# Patient Record
Sex: Female | Born: 1960
Health system: Southern US, Community
[De-identification: ages and names within clinical notes are randomized; demographics above are authoritative.]

## PROBLEM LIST (undated history)

## (undated) DIAGNOSIS — F4310 Post-traumatic stress disorder, unspecified: Secondary | ICD-10-CM

## (undated) DIAGNOSIS — F319 Bipolar disorder, unspecified: Secondary | ICD-10-CM

## (undated) DIAGNOSIS — E785 Hyperlipidemia, unspecified: Secondary | ICD-10-CM

## (undated) DIAGNOSIS — E119 Type 2 diabetes mellitus without complications: Secondary | ICD-10-CM

## (undated) DIAGNOSIS — I10 Essential (primary) hypertension: Secondary | ICD-10-CM

## (undated) HISTORY — DX: Essential (primary) hypertension: I10

## (undated) HISTORY — DX: Hyperlipidemia, unspecified: E78.5

## (undated) HISTORY — PX: OTHER SURGICAL HISTORY: SHX169

## (undated) HISTORY — PX: TUBAL LIGATION: SHX77

## (undated) HISTORY — DX: Type 2 diabetes mellitus without complications: E11.9

---

## 2012-07-20 ENCOUNTER — Other Ambulatory Visit (HOSPITAL_COMMUNITY)
Admission: RE | Admit: 2012-07-20 | Discharge: 2012-07-20 | Disposition: A | Discharge status: Home / Self Care | Payer: BC Managed Care – PPO | Source: Ambulatory Visit | Attending: Family Medicine | Admitting: Family Medicine

## 2012-07-20 DIAGNOSIS — Z113 Encounter for screening for infections with a predominantly sexual mode of transmission: Secondary | ICD-10-CM | POA: Insufficient documentation

## 2012-07-20 DIAGNOSIS — Z124 Encounter for screening for malignant neoplasm of cervix: Secondary | ICD-10-CM | POA: Insufficient documentation

## 2012-10-19 ENCOUNTER — Encounter: Payer: BC Managed Care – PPO | Attending: Family Medicine

## 2012-10-19 VITALS — Ht 62.5 in | Wt 188.2 lb

## 2012-10-19 DIAGNOSIS — Z713 Dietary counseling and surveillance: Secondary | ICD-10-CM | POA: Insufficient documentation

## 2012-10-19 DIAGNOSIS — E119 Type 2 diabetes mellitus without complications: Secondary | ICD-10-CM | POA: Insufficient documentation

## 2012-10-25 DIAGNOSIS — E119 Type 2 diabetes mellitus without complications: Secondary | ICD-10-CM

## 2012-10-25 NOTE — Progress Notes (Signed)
Patient was seen on 10/25/12 for the second of a series of three diabetes self-management courses at the [facility]. The following learning objectives were met by the patient during this course:   Explain basic nutrition maintenance and quality assurance  Describe causes, symptoms and treatment of hypoglycemia and hyperglycemia  Explain how to manage diabetes during illness  Describe the importance of good nutrition for health and healthy eating strategies  List strategies to follow meal plan when dining out  Describe the effects of alcohol on glucose and how to use it safely  Describe problem solving skills for day-to-day glucose challenges  Describe strategies to use when treatment plan needs to change  Identify important factors involved in successful weight loss  Describe ways to remain physically active  Describe the impact of regular activity on insulin resistance  Identify current diabetes medications, their action on blood glucose, and [pssible side effects.  Handouts given in class:  Refrigerator magnet for Sick Day Guidelines  [facility] Oral medication/insulin handout  Your patient has identified their diabetes self-care support plan as:  [facility] support group   Follow-Up Plan: Patient will attend the final class of the ADA Diabetes Self-Care Education.  

## 2012-10-26 NOTE — Progress Notes (Signed)
Patient was seen on 10/19/12 for the first of a series of three diabetes self-management courses at the [facility].   Current HbA1c: 8.1%  The following learning objectives were met by the patient during this course:   Defines the role of glucose and insulin  Identifies type of diabetes and pathophysiology  Defines the diagnostic criteria for diabetes and prediabetes  States the risk factors for Type 2 Diabetes  States the symptoms of Type 2 Diabetes  Defines Type 2 Diabetes treatment goals  Defines Type 2 Diabetes treatment options  States the rationale for glucose monitoring  Identifies A1C, glucose targets, and testing times  Identifies proper sharps disposal  Defines the purpose of a diabetes food plan  Identifies carbohydrate food groups  Defines effects of carbohydrate foods on glucose levels  Identifies carbohydrate choices/grams/food labels  States benefits of physical activity and effect on glucose  Review of suggested activity guidelines  Handouts given during class include:  Type 2 Diabetes: Basics Book  My Food Plan Book  Food and Activity Log  Your patient has identified their diabetes self-care support plan as:  [facility] support group  Continued diabetes education  Follow-Up Plan: Attend core 2 and core 3

## 2012-10-26 NOTE — Patient Instructions (Signed)
Goals:  Follow Diabetes Meal Plan as instructed  Eat 3 meals and 2 snacks, every 3-5 hrs  Limit carbohydrate intake to 45 grams carbohydrate/meal  Limit carbohydrate intake to 0-15 grams carbohydrate/snack  Add lean protein foods to meals/snacks  Monitor glucose levels as instructed by your doctor  Aim for 15-30 mins of physical activity daily  Bring food record and glucose log to your next nutrition visit

## 2012-11-02 ENCOUNTER — Encounter: Payer: Self-pay | Admitting: Family Medicine

## 2012-11-03 DIAGNOSIS — E119 Type 2 diabetes mellitus without complications: Secondary | ICD-10-CM

## 2012-11-13 NOTE — Progress Notes (Signed)
Patient was seen on 11/03/12 for the third of a series of three diabetes self-management courses at the [facility]. The following learning objectives were met by the patient during this course:    Describe how diabetes changes over time   Identify diabetes complications and ways to prevent them   Describe strategies that can promote heart health including lowering blood pressure and cholesterol   Describe strategies to lower dietary fat and sodium in the diet   Identify physical activities that benefit cardiovascular health   Describe role of stress on blood glucose and develop strategies to address psychosocial issues   Evaluate success in meeting personal goal   Describe the belief that they can live successfully with diabetes day to day   Establish 2-3 goals that they will plan to diligently work on until they return for the free 42-month follow-up visit  The following handouts were given in class:  Goal setting handout  Class evaluation form  Low-sodium seasoning tips  Stress management handout  Your patient has established the following 4 month goals for diabetes self-care:  Be active 30 minutes 4 times a week  Test glucose at least 2 times a day, 4 days a week  Your patient has identified these potential barriers to change:  None stated  Your patient has identified their diabetes self-care support plan as:  [facility] support group available  Follow-Up Plan: Patient was offered a 4 month follow-up visit for diabetes self-management education.

## 2013-03-05 ENCOUNTER — Ambulatory Visit: Payer: BC Managed Care – PPO | Admitting: Dietician

## 2013-10-09 ENCOUNTER — Other Ambulatory Visit: Payer: Self-pay

## 2013-10-09 DIAGNOSIS — Z1231 Encounter for screening mammogram for malignant neoplasm of breast: Secondary | ICD-10-CM

## 2013-10-24 ENCOUNTER — Ambulatory Visit
Admission: RE | Admit: 2013-10-24 | Discharge: 2013-10-24 | Disposition: A | Discharge status: Home / Self Care | Payer: 59 | Source: Ambulatory Visit

## 2013-10-24 DIAGNOSIS — Z1231 Encounter for screening mammogram for malignant neoplasm of breast: Secondary | ICD-10-CM

## 2014-05-20 ENCOUNTER — Encounter: Payer: Self-pay | Admitting: Skilled Nursing Facility1

## 2014-05-20 ENCOUNTER — Encounter: Payer: 59 | Attending: Internal Medicine | Admitting: Skilled Nursing Facility1

## 2014-05-20 VITALS — Ht 62.0 in | Wt 187.0 lb

## 2014-05-20 DIAGNOSIS — Z713 Dietary counseling and surveillance: Secondary | ICD-10-CM | POA: Diagnosis not present

## 2014-05-20 DIAGNOSIS — E119 Type 2 diabetes mellitus without complications: Secondary | ICD-10-CM | POA: Diagnosis not present

## 2014-05-20 NOTE — Progress Notes (Signed)
Diabetes Self-Management Education  Visit Type:    Appt. Start Time: 11:00Appt. End Time: 12:30  05/20/2014  [name], identified by name and date of birth, is a 54 y.o. female with a diagnosis of Diabetes: Type 2.  Other people present during visit:      ASSESSMENT  Height 5\' 2"  (1.575 m), weight 187 lb (84.823 kg). Body mass index is 34.19 kg/(m^2).  Patient states she was diagnosed with PCOS one year ago and is also bipolar. Patient has a lot of emotional distress with the concern of food. Patient states she gets interrupted sleep due to frequent urination in the night. Patient states she needs to eat at least 3 carbohydrate choices per meal other wise she will be starving and states due to her exercise she needs a lot more food in order to not be hungry. Initial Visit Information:  Are you currently following a meal plan?: No   Are you taking your medications as prescribed?: Yes Are you checking your feet?: No   How often do you need to have someone help you when you read instructions, pamphlets, or other written materials from your doctor or pharmacy?: 1 - Never    Psychosocial:     Patient Belief/Attitude about Diabetes: Denial Self-care barriers: None Self-management support: Family Patient Concerns: Nutrition/Meal planning, Healthy Lifestyle, Weight Control Special Needs: None Preferred Learning Style: Visual, Auditory Learning Readiness: Ready  Complications:   Last HgB A1C per patient/outside source: 9 mg/dL How often do you check your blood sugar?: 1-2 times/day Number of hypoglycemic episodes per month: 0 Number of hyperglycemic episodes per week: 0 Have you had a dilated eye exam in the past 12 months?: No Have you had a dental exam in the past 12 months?: Yes  Diet Intake:  Breakfast: 2 eggo waffels peanut butter and half banana--------1 toast or waffle peanut butter with fruit sometimes milk Snack (morning): 1 slice deli ham Lunch: lentil  soup-----wendys Snack (afternoon): none Dinner: 3 bean vegetabke chili-----meat, vegetable Snack (evening): penut butter sandwhich Beverage(s): coffee with flavored creamers  Exercise:  Exercise: Other (comment) (exercise bike 30 minutes every day)  Individualized Plan for Diabetes Self-Management Training:   Learning Objective:  Patient will have a greater understanding of diabetes self-management.  Patient education plan per assessed needs and concerns is to attend individual sessions for     Education Topics Reviewed with Patient Today:  Definition of diabetes, type 1 and 2, and the diagnosis of diabetes, Factors that contribute to the development of diabetes   Role of exercise on diabetes management, blood pressure control and cardiac health., Identified with patient nutritional and/or medication changes necessary with exercise.   Daily foot exams Taught treatment of hypoglycemia - the 15 rule.          PATIENTS GOALS/Plan (Developed by the patient):  Nutrition: Follow meal plan discussed, General guidelines for healthy choices and portions discussed, Adjust meds/carbs with exercise as discussed  Plan:   There are no Patient Instructions on file for this visit.  Expected Outcomes:  Demonstrated interest in learning. Expect positive outcomes  Education material provided: Living Well with Diabetes, Meal plan card and Snack sheet  If problems or questions, patient to contact team via:  Phone  Future DSME appointment: 3-4 months

## 2014-08-16 ENCOUNTER — Ambulatory Visit: Payer: 59 | Admitting: Skilled Nursing Facility1

## 2014-09-27 ENCOUNTER — Ambulatory Visit: Payer: 59 | Admitting: Skilled Nursing Facility1

## 2015-05-16 ENCOUNTER — Other Ambulatory Visit: Payer: Self-pay | Admitting: Family Medicine

## 2015-05-16 ENCOUNTER — Other Ambulatory Visit (HOSPITAL_COMMUNITY)
Admission: RE | Admit: 2015-05-16 | Discharge: 2015-05-16 | Disposition: A | Discharge status: Home / Self Care | Payer: BLUE CROSS/BLUE SHIELD | Source: Ambulatory Visit | Attending: Family Medicine | Admitting: Family Medicine

## 2015-05-16 DIAGNOSIS — Z01411 Encounter for gynecological examination (general) (routine) with abnormal findings: Secondary | ICD-10-CM | POA: Diagnosis present

## 2015-05-16 DIAGNOSIS — Z1151 Encounter for screening for human papillomavirus (HPV): Secondary | ICD-10-CM | POA: Insufficient documentation

## 2015-05-22 LAB — CYTOLOGY - PAP

## 2015-06-18 ENCOUNTER — Other Ambulatory Visit: Payer: Self-pay | Admitting: Obstetrics and Gynecology

## 2015-07-08 ENCOUNTER — Other Ambulatory Visit: Payer: Self-pay | Admitting: Obstetrics and Gynecology

## 2015-07-23 ENCOUNTER — Other Ambulatory Visit: Payer: Self-pay

## 2015-07-23 DIAGNOSIS — Z1231 Encounter for screening mammogram for malignant neoplasm of breast: Secondary | ICD-10-CM

## 2015-08-07 ENCOUNTER — Ambulatory Visit
Admission: RE | Admit: 2015-08-07 | Discharge: 2015-08-07 | Disposition: A | Discharge status: Home / Self Care | Payer: BLUE CROSS/BLUE SHIELD | Source: Ambulatory Visit

## 2015-08-07 DIAGNOSIS — Z1231 Encounter for screening mammogram for malignant neoplasm of breast: Secondary | ICD-10-CM

## 2015-08-14 ENCOUNTER — Ambulatory Visit: Payer: 59

## 2016-06-16 ENCOUNTER — Emergency Department (HOSPITAL_COMMUNITY): Payer: Self-pay

## 2016-06-16 ENCOUNTER — Encounter (HOSPITAL_COMMUNITY): Payer: Self-pay

## 2016-06-16 ENCOUNTER — Emergency Department (HOSPITAL_COMMUNITY)
Admission: EM | Admit: 2016-06-16 | Discharge: 2016-06-16 | Disposition: A | Discharge status: Home / Self Care | Payer: Self-pay | Attending: Emergency Medicine | Admitting: Emergency Medicine

## 2016-06-16 DIAGNOSIS — E119 Type 2 diabetes mellitus without complications: Secondary | ICD-10-CM | POA: Insufficient documentation

## 2016-06-16 DIAGNOSIS — Z79899 Other long term (current) drug therapy: Secondary | ICD-10-CM | POA: Insufficient documentation

## 2016-06-16 DIAGNOSIS — Y92002 Bathroom of unspecified non-institutional (private) residence single-family (private) house as the place of occurrence of the external cause: Secondary | ICD-10-CM | POA: Insufficient documentation

## 2016-06-16 DIAGNOSIS — Y9389 Activity, other specified: Secondary | ICD-10-CM | POA: Insufficient documentation

## 2016-06-16 DIAGNOSIS — Y999 Unspecified external cause status: Secondary | ICD-10-CM | POA: Insufficient documentation

## 2016-06-16 DIAGNOSIS — I10 Essential (primary) hypertension: Secondary | ICD-10-CM | POA: Insufficient documentation

## 2016-06-16 DIAGNOSIS — Z794 Long term (current) use of insulin: Secondary | ICD-10-CM | POA: Insufficient documentation

## 2016-06-16 DIAGNOSIS — R55 Syncope and collapse: Secondary | ICD-10-CM

## 2016-06-16 DIAGNOSIS — W228XXA Striking against or struck by other objects, initial encounter: Secondary | ICD-10-CM | POA: Insufficient documentation

## 2016-06-16 DIAGNOSIS — S0101XA Laceration without foreign body of scalp, initial encounter: Secondary | ICD-10-CM | POA: Insufficient documentation

## 2016-06-16 HISTORY — DX: Bipolar disorder, unspecified: F31.9

## 2016-06-16 HISTORY — DX: Post-traumatic stress disorder, unspecified: F43.10

## 2016-06-16 LAB — URINALYSIS, ROUTINE W REFLEX MICROSCOPIC
BILIRUBIN URINE: NEGATIVE
Glucose, UA: 50 mg/dL — AB
HGB URINE DIPSTICK: NEGATIVE
Ketones, ur: NEGATIVE mg/dL
Nitrite: NEGATIVE
Protein, ur: 100 mg/dL — AB
SPECIFIC GRAVITY, URINE: 1.018 (ref 1.005–1.030)
pH: 7 (ref 5.0–8.0)

## 2016-06-16 LAB — HEPATIC FUNCTION PANEL
ALBUMIN: 4 g/dL (ref 3.5–5.0)
ALT: 26 U/L (ref 14–54)
AST: 28 U/L (ref 15–41)
Alkaline Phosphatase: 70 U/L (ref 38–126)
BILIRUBIN INDIRECT: 0.3 mg/dL (ref 0.3–0.9)
Bilirubin, Direct: 0.1 mg/dL (ref 0.1–0.5)
Total Bilirubin: 0.4 mg/dL (ref 0.3–1.2)
Total Protein: 6.8 g/dL (ref 6.5–8.1)

## 2016-06-16 LAB — CBC
HEMATOCRIT: 40.7 % (ref 36.0–46.0)
HEMOGLOBIN: 13.2 g/dL (ref 12.0–15.0)
MCH: 26.8 pg (ref 26.0–34.0)
MCHC: 32.4 g/dL (ref 30.0–36.0)
MCV: 82.7 fL (ref 78.0–100.0)
PLATELETS: 290 10*3/uL (ref 150–400)
RBC: 4.92 MIL/uL (ref 3.87–5.11)
RDW: 14.7 % (ref 11.5–15.5)
WBC: 15.7 10*3/uL — AB (ref 4.0–10.5)

## 2016-06-16 LAB — I-STAT TROPONIN, ED: Troponin i, poc: 0 ng/mL (ref 0.00–0.08)

## 2016-06-16 LAB — LIPASE, BLOOD: Lipase: 31 U/L (ref 11–51)

## 2016-06-16 LAB — BASIC METABOLIC PANEL
Anion gap: 9 (ref 5–15)
BUN: 14 mg/dL (ref 6–20)
CHLORIDE: 102 mmol/L (ref 101–111)
CO2: 27 mmol/L (ref 22–32)
CREATININE: 0.76 mg/dL (ref 0.44–1.00)
Calcium: 9.7 mg/dL (ref 8.9–10.3)
GFR calc non Af Amer: >60 mL/min (ref >60)
Glucose, Bld: 154 mg/dL — ABNORMAL HIGH (ref 65–99)
Potassium: 3.8 mmol/L (ref 3.5–5.1)
SODIUM: 138 mmol/L (ref 135–145)

## 2016-06-16 LAB — CBG MONITORING, ED: GLUCOSE-CAPILLARY: 143 mg/dL — AB (ref 65–99)

## 2016-06-16 MED ORDER — TETANUS-DIPHTH-ACELL PERTUSSIS 5-2.5-18.5 LF-MCG/0.5 IM SUSP
0.5000 mL | Freq: Once | INTRAMUSCULAR | Status: AC
  Administered 2016-06-16: 0.5 mL via INTRAMUSCULAR
  Filled 2016-06-16: qty 0.5

## 2016-06-16 MED ORDER — LIDOCAINE-EPINEPHRINE (PF) 2 %-1:200000 IJ SOLN
10.0000 mL | Freq: Once | INTRAMUSCULAR | Status: AC
Start: 2016-06-16 — End: 2016-06-16
  Administered 2016-06-16: 10 mL
  Filled 2016-06-16: qty 20

## 2016-06-16 MED ORDER — OXYCODONE-ACETAMINOPHEN 5-325 MG PO TABS: 1.0000 | ORAL_TABLET | Freq: Four times a day (QID) | ORAL | 0 refills | Status: DC | PRN

## 2016-06-16 MED ORDER — SODIUM CHLORIDE 0.9 % IV BOLUS (SEPSIS)
1000.0000 mL | Freq: Once | INTRAVENOUS | Status: AC
  Administered 2016-06-16: 1000 mL via INTRAVENOUS

## 2016-06-16 NOTE — ED Notes (Signed)
Patient transported to CT via stretcher.

## 2016-06-16 NOTE — ED Notes (Signed)
Pt given water to drink. Denies dizziness or lightheadedness during ambulation, appeared steady on her feet.

## 2016-06-16 NOTE — ED Notes (Signed)
Suture cart at bedside 

## 2016-06-16 NOTE — ED Notes (Signed)
Patient returned from CT and placed back on monitor.  

## 2016-06-16 NOTE — Discharge Instructions (Signed)
You were seen today after passing out and hitting your head.  Your workup is reassuring. You likely had a vagal episode that made to pass out. Make sure that he stay hydrated. Your laceration was repaired with staples. These need to be removed in 10 days.

## 2016-06-16 NOTE — ED Provider Notes (Signed)
[facility] DEPT Provider Note   CSN: [id number] Arrival date & time: 06/16/16  9563  By signing my name below, I, Arianna Nassar, attest that this documentation has been prepared under the direction and in the presence of Merryl Hacker, MD.  Electronically Signed: Julien Nordmann, ED Scribe. 06/16/16. 1:25 AM.    History   Chief Complaint Chief Complaint  Patient presents with  . Loss of Consciousness  . Head Laceration   The history is provided by the patient and a relative. No language interpreter was used.   HPI Comments: Jessica Guzman is a 56 y.o. female who has a PMhx of DM, HLD, HTN, and bipolar 1 disorder presents to the Emergency Department complaining of gradual worsening, right mid back pain and head pain s/p a syncopal episode that led into a fall PTA. Pt also has a laceration the back of her head. Pt says that her stomach was bothering her so she went to the bathroom. Pt reports that she does not remember losing consciousness. Pt says she awoke and realized she had hit her head when she saw the blood on her pillow. Pt says she remembers losing consciousness a second time. She notes she has no prior hx of syncopal episodes in the past. She denies ETOH or drug abuse and any new medications. Pt further denies fever, cough, nausea, vomiting, chest pain, and bladder or bowel incontinence.  Past Medical History:  Diagnosis Date  . Bipolar 1 disorder ([place])   . Diabetes mellitus without complication ([place])   . Hyperlipidemia   . Hypertension   . PTSD (post-traumatic stress disorder)     There are no active problems to display for this patient.   Past Surgical History:  Procedure Laterality Date  . PCOS    . TUBAL LIGATION      OB History    No data available       Home Medications    Prior to Admission medications   Medication Sig Start Date End Date Taking? Authorizing Provider  cholecalciferol (VITAMIN D) 1000 UNITS tablet Take 2,000 Units by mouth daily.     Yes Historical Provider, MD  insulin glargine (LANTUS) 100 UNIT/ML injection Inject 16 Units into the skin at bedtime.    Yes Historical Provider, MD  liraglutide (VICTOZA) 18 MG/3ML SOPN Inject 7.2 mg into the skin daily.   Yes Historical Provider, MD  metFORMIN (GLUCOPHAGE) 1000 MG tablet Take 1,000 mg by mouth 2 (two) times daily with a meal.   Yes Historical Provider, MD  OXcarbazepine (TRILEPTAL) 150 MG tablet Take 150 mg by mouth 2 (two) times daily.   Yes Historical Provider, MD  PARoxetine (PAXIL) 20 MG tablet Take 20 mg by mouth every evening.    Yes Historical Provider, MD  QUEtiapine (SEROQUEL) 100 MG tablet Take 100 mg by mouth at bedtime.   Yes Historical Provider, MD  oxyCODONE-acetaminophen (PERCOCET/ROXICET) 5-325 MG tablet Take 1 tablet by mouth every 6 (six) hours as needed for severe pain. 06/16/16   Merryl Hacker, MD    Family History Family History  Problem Relation Age of Onset  . Hypertension Other     Social History Social History  Substance Use Topics  . Smoking status: Never Smoker  . Smokeless tobacco: Not on file  . Alcohol use Yes     Comment: rarely     Allergies   Codeine   Review of Systems Review of Systems  Respiratory: Negative for cough and shortness of breath.  Cardiovascular: Negative for chest pain.  Gastrointestinal: Negative for nausea and vomiting.  Skin: Positive for wound.  Neurological: Positive for dizziness and syncope.  All other systems reviewed and are negative.    Physical Exam Updated Vital Signs BP 103/66   Pulse 83   Temp 98.2 F (36.8 C) (Oral)   Resp (!) 21   SpO2 94%   Physical Exam  Constitutional: She is oriented to person, place, and time.  ABCs intact  HENT:  Head: Normocephalic.  4 cm vertical laceration the back of the head  Eyes: EOM are normal. Pupils are equal, round, and reactive to light.  Neck:  No midline c spine ttp, step off or deformity  Cardiovascular: Normal rate and regular  rhythm.   No murmur heard. Pulmonary/Chest: Effort normal and breath sounds normal. She has no wheezes.  Abdominal: Soft. Bowel sounds are normal. There is no tenderness. There is no guarding.  Musculoskeletal: She exhibits no edema.  Neurological: She is alert and oriented to person, place, and time.  Skin: No erythema.  Psychiatric: She has a normal mood and affect.  Nursing note and vitals reviewed.    ED Treatments / Results  DIAGNOSTIC STUDIES: Oxygen Saturation is 97% on RA, normal by my interpretation.  COORDINATION OF CARE: 1:21 AM Discussed treatment plan with pt at bedside and pt agreed to plan.  Labs (all labs ordered are listed, but only abnormal results are displayed) Labs Reviewed  BASIC METABOLIC PANEL - Abnormal; Notable for the following:       Result Value   Glucose, Bld 154 (*)    All other components within normal limits  CBC - Abnormal; Notable for the following:    WBC 15.7 (*)    All other components within normal limits  URINALYSIS, ROUTINE W REFLEX MICROSCOPIC - Abnormal; Notable for the following:    Color, Urine AMBER (*)    APPearance CLOUDY (*)    Glucose, UA 50 (*)    Protein, ur 100 (*)    Leukocytes, UA SMALL (*)    Bacteria, UA RARE (*)    Squamous Epithelial / LPF 6-30 (*)    All other components within normal limits  CBG MONITORING, ED - Abnormal; Notable for the following:    Glucose-Capillary 143 (*)    All other components within normal limits  HEPATIC FUNCTION PANEL  LIPASE, BLOOD  I-STAT TROPOININ, ED    EKG  EKG Interpretation  Date/Time:  Wednesday June 16 2016 00:42:50 EDT Ventricular Rate:  71 PR Interval:  154 QRS Duration: 88 QT Interval:  432 QTC Calculation: 469 R Axis:   68 Text Interpretation:  Normal sinus rhythm Cannot rule out Anterior infarct , age undetermined Abnormal ECG Confirmed by Dina Rich  MD, Loma Sousa ([id number]) on 06/16/2016 1:11:08 AM Also confirmed by Dina Rich  MD, COURTNEY ([id number]), editor Lorenda Cahill CT,  Leda Gauze [phone])  on 06/16/2016 7:54:41 AM       Radiology Dg Lumbar Spine Complete  Result Date: 06/16/2016 CLINICAL DATA:  Acute onset of syncope and fall. Lower back pain. Initial encounter. EXAM: LUMBAR SPINE - COMPLETE 4+ VIEW COMPARISON:  None. FINDINGS: There is no evidence of fracture or subluxation. Vertebral bodies demonstrate normal height and alignment. Intervertebral disc spaces are preserved. The visualized neural foramina are grossly unremarkable in appearance. The visualized bowel gas pattern is unremarkable in appearance; air and stool are noted within the colon. The sacroiliac joints are within normal limits. Bilateral tubal ligation clips are noted. IMPRESSION: No evidence of  fracture or subluxation along the lumbar spine. Electronically Signed   By: Garald Balding M.D.   On: 06/16/2016 02:57   Dg Sacrum/coccyx  Result Date: 06/16/2016 CLINICAL DATA:  Acute onset of syncope and fall. Sacral pain. Initial encounter. EXAM: SACRUM AND COCCYX - 2+ VIEW COMPARISON:  None. FINDINGS: There is no evidence of fracture or dislocation. The sacrum and coccyx are grossly unremarkable in appearance. The lower lumbar spine is grossly unremarkable. The sacroiliac joints are within normal limits. Bilateral tubal ligation clips are noted. IMPRESSION: No evidence of fracture or dislocation. Electronically Signed   By: Garald Balding M.D.   On: 06/16/2016 02:58   Ct Head Wo Contrast  Result Date: 06/16/2016 CLINICAL DATA:  Status post syncope and fall. Hit posterior aspect of head against tile floor. Laceration at the posterior head. Initial encounter. EXAM: CT HEAD WITHOUT CONTRAST TECHNIQUE: Contiguous axial images were obtained from the base of the skull through the vertex without intravenous contrast. COMPARISON:  None. FINDINGS: Brain: No evidence of acute infarction, hemorrhage, hydrocephalus, extra-axial collection or mass lesion/mass effect. Prominence of the sulci suggests mild cortical volume  loss. Mild cerebellar atrophy is noted. The brainstem and fourth ventricle are within normal limits. The basal ganglia are unremarkable in appearance. The cerebral hemispheres demonstrate grossly normal gray-white differentiation. No mass effect or midline shift is seen. Vascular: No hyperdense vessel or unexpected calcification. Skull: There is no evidence of fracture; visualized osseous structures are unremarkable in appearance. Sinuses/Orbits: The orbits are within normal limits. The paranasal sinuses and mastoid air cells are well-aerated. Other: A soft tissue laceration is noted overlying the left posterior parietal calvarium. IMPRESSION: 1. No evidence of traumatic intracranial injury or fracture. 2. Soft tissue laceration overlying the left posterior parietal calvarium. Electronically Signed   By: Garald Balding M.D.   On: 06/16/2016 01:22   Ct Cervical Spine Wo Contrast  Result Date: 06/16/2016 CLINICAL DATA:  Acute onset of syncope and fall, hitting head. Laceration at the back of the head. Dizziness and headache. Hypotension. Initial encounter. EXAM: CT CERVICAL SPINE WITHOUT CONTRAST TECHNIQUE: Multidetector CT imaging of the cervical spine was performed without intravenous contrast. Multiplanar CT image reconstructions were also generated. COMPARISON:  None. FINDINGS: Alignment: Normal. Skull base and vertebrae: No acute fracture. No primary bone lesion or focal pathologic process. Soft tissues and spinal canal: No prevertebral fluid or swelling. No visible canal hematoma. Disc levels: Intervertebral disc spaces are preserved. The bony foramina are grossly unremarkable. Upper chest: Minimal atelectasis is noted at the lung apices. The thyroid gland is grossly unremarkable. Other: No additional soft tissue abnormalities are seen. The visualized portions of the brain are grossly unremarkable. IMPRESSION: No evidence of fracture or subluxation along the cervical spine. Electronically Signed   By:  Garald Balding M.D.   On: 06/16/2016 02:09    Procedures Procedures (including critical care time)  Medications Ordered in ED Medications  sodium chloride 0.9 % bolus 1,000 mL (0 mLs Intravenous Stopped 06/16/16 0430)  Tdap (BOOSTRIX) injection 0.5 mL (0.5 mLs Intramuscular Given 06/16/16 0225)  lidocaine-EPINEPHrine (XYLOCAINE W/EPI) 2 %-1:200000 (PF) injection 10 mL (10 mLs Infiltration Given 06/16/16 0225)     Initial Impression / Assessment and Plan / ED Course  I have reviewed the triage vital signs and the nursing notes.  Pertinent labs & imaging results that were available during my care of the patient were reviewed by me and considered in my medical decision making (see chart for details).    I  personally performed the services described in this documentation, which was scribed in my presence. The recorded information has been reviewed and is accurate.   Final Clinical Impressions(s) / ED Diagnoses   Final diagnoses:  Vasovagal syncope  Laceration of scalp without foreign body, initial encounter   Patient presents after a syncopal episode after going to the bathroom . VS reassuring.  Exam as above.  EKG without arrythmia.  Patient given fluids.  Laceration repaired by PA.  Imaging reassuring and patient ambulatory independently and eating without difficulty.  Follow-up for staple removal.  After history, exam, and medical workup I feel the patient has been appropriately medically screened and is safe for discharge home. Pertinent diagnoses were discussed with the patient. Patient was given return precautions.  New Prescriptions Discharge Medication List as of 06/16/2016  5:10 AM       Merryl Hacker, MD 06/19/16 [phone]

## 2016-06-16 NOTE — ED Triage Notes (Signed)
Pt states she was having a stomach and went to go to the bathroom; pt states she past out hitting head; pt present with Lac to back of head; pt states she remembers felling dizzy prior to fall; pt lives alone and called sister; pt presents hypotensive at triage; Pt states HA at 6/10 on arrival and some back pain; pt a&ox 4

## 2016-06-16 NOTE — ED Notes (Signed)
Pt ambulated self efficiently with no difficulty to and from restroom.

## 2016-06-16 NOTE — ED Provider Notes (Signed)
LACERATION REPAIR Performed by: Charlann Lange A Authorized by: Charlann Lange A Consent: Verbal consent obtained. Risks and benefits: risks, benefits and alternatives were discussed Consent given by: patient Patient identity confirmed: provided demographic data Prepped and Draped in normal sterile fashion Wound explored  Laceration Location: scalp, right  Laceration Length: 4 cm  No Foreign Bodies seen or palpated  Anesthesia: local infiltration  Local anesthetic: lidocaine 2% w/epinephrine  Anesthetic total: 3 ml  Irrigation method: syringe Amount of cleaning: standard  Skin closure: staple  Number of sutures: 8  Technique: staple  Patient tolerance: Patient tolerated the procedure well with no immediate complications.    Charlann Lange, PA-C 06/16/16 [place], MD 06/17/16 [phone]

## 2016-06-16 NOTE — ED Notes (Signed)
Patient placed on 2 L O2 [place] d/t SpO2 decreasing to 85% RA while sleeping. Upon waking patient up, SpO2 increased to 92% RA. MD made aware.

## 2017-01-20 ENCOUNTER — Emergency Department (HOSPITAL_COMMUNITY)
Admission: EM | Admit: 2017-01-20 | Discharge: 2017-01-22 | Disposition: A | Discharge status: Home / Self Care | Payer: Self-pay | Attending: Emergency Medicine | Admitting: Emergency Medicine

## 2017-01-20 ENCOUNTER — Encounter (HOSPITAL_COMMUNITY): Payer: Self-pay | Admitting: Emergency Medicine

## 2017-01-20 DIAGNOSIS — I10 Essential (primary) hypertension: Secondary | ICD-10-CM | POA: Insufficient documentation

## 2017-01-20 DIAGNOSIS — Z794 Long term (current) use of insulin: Secondary | ICD-10-CM | POA: Insufficient documentation

## 2017-01-20 DIAGNOSIS — Z79899 Other long term (current) drug therapy: Secondary | ICD-10-CM | POA: Insufficient documentation

## 2017-01-20 DIAGNOSIS — E119 Type 2 diabetes mellitus without complications: Secondary | ICD-10-CM | POA: Insufficient documentation

## 2017-01-20 DIAGNOSIS — R45851 Suicidal ideations: Secondary | ICD-10-CM | POA: Insufficient documentation

## 2017-01-20 DIAGNOSIS — F3112 Bipolar disorder, current episode manic without psychotic features, moderate: Secondary | ICD-10-CM | POA: Insufficient documentation

## 2017-01-20 DIAGNOSIS — E785 Hyperlipidemia, unspecified: Secondary | ICD-10-CM | POA: Insufficient documentation

## 2017-01-20 LAB — SALICYLATE LEVEL

## 2017-01-20 LAB — I-STAT BETA HCG BLOOD, ED (MC, WL, AP ONLY)

## 2017-01-20 LAB — CBG MONITORING, ED: GLUCOSE-CAPILLARY: 186 mg/dL — AB (ref 65–99)

## 2017-01-20 LAB — CBC
HCT: 43.4 % (ref 36.0–46.0)
Hemoglobin: 14.4 g/dL (ref 12.0–15.0)
MCH: 26.9 pg (ref 26.0–34.0)
MCHC: 33.2 g/dL (ref 30.0–36.0)
MCV: 81 fL (ref 78.0–100.0)
PLATELETS: 338 10*3/uL (ref 150–400)
RBC: 5.36 MIL/uL — ABNORMAL HIGH (ref 3.87–5.11)
RDW: 14.5 % (ref 11.5–15.5)
WBC: 12.6 10*3/uL — ABNORMAL HIGH (ref 4.0–10.5)

## 2017-01-20 LAB — RAPID URINE DRUG SCREEN, HOSP PERFORMED
Amphetamines: NOT DETECTED
Barbiturates: NOT DETECTED
Benzodiazepines: NOT DETECTED
Cocaine: NOT DETECTED
OPIATES: NOT DETECTED
Tetrahydrocannabinol: NOT DETECTED

## 2017-01-20 LAB — COMPREHENSIVE METABOLIC PANEL
ALBUMIN: 4.6 g/dL (ref 3.5–5.0)
ALK PHOS: 81 U/L (ref 38–126)
ALT: 23 U/L (ref 14–54)
ANION GAP: 8 (ref 5–15)
AST: 32 U/L (ref 15–41)
BILIRUBIN TOTAL: 0.8 mg/dL (ref 0.3–1.2)
BUN: 13 mg/dL (ref 6–20)
CALCIUM: 9.8 mg/dL (ref 8.9–10.3)
CO2: 27 mmol/L (ref 22–32)
CREATININE: 0.62 mg/dL (ref 0.44–1.00)
Chloride: 101 mmol/L (ref 101–111)
GFR calc non Af Amer: >60 mL/min (ref >60)
GLUCOSE: 160 mg/dL — AB (ref 65–99)
Potassium: 4.4 mmol/L (ref 3.5–5.1)
SODIUM: 136 mmol/L (ref 135–145)
TOTAL PROTEIN: 8 g/dL (ref 6.5–8.1)

## 2017-01-20 LAB — ETHANOL

## 2017-01-20 LAB — ACETAMINOPHEN LEVEL: Acetaminophen (Tylenol), Serum: <10 ug/mL — ABNORMAL LOW (ref 10–30)

## 2017-01-20 MED ORDER — VITAMIN D 1000 UNITS PO TABS
2000.0000 [IU] | ORAL_TABLET | Freq: Every day | ORAL | Status: DC
  Administered 2017-01-20 – 2017-01-22 (×3): 2000 [IU] via ORAL
  Filled 2017-01-20 (×3): qty 2

## 2017-01-20 MED ORDER — OXCARBAZEPINE 150 MG PO TABS
150.0000 mg | ORAL_TABLET | Freq: Two times a day (BID) | ORAL | Status: DC
  Administered 2017-01-20 – 2017-01-21 (×2): 150 mg via ORAL
  Filled 2017-01-20 (×2): qty 1

## 2017-01-20 MED ORDER — INSULIN GLARGINE 100 UNIT/ML ~~LOC~~ SOLN
16.0000 [IU] | Freq: Every day | SUBCUTANEOUS | Status: DC
  Administered 2017-01-20 – 2017-01-21 (×2): 16 [IU] via SUBCUTANEOUS
  Filled 2017-01-20 (×2): qty 0.16

## 2017-01-20 MED ORDER — METFORMIN HCL 500 MG PO TABS
1000.0000 mg | ORAL_TABLET | Freq: Two times a day (BID) | ORAL | Status: DC
  Administered 2017-01-20 – 2017-01-22 (×4): 1000 mg via ORAL
  Filled 2017-01-20 (×4): qty 2

## 2017-01-20 MED ORDER — PAROXETINE HCL 20 MG PO TABS
20.0000 mg | ORAL_TABLET | Freq: Every evening | ORAL | Status: DC
  Administered 2017-01-20 – 2017-01-21 (×2): 20 mg via ORAL
  Filled 2017-01-20 (×2): qty 1

## 2017-01-20 MED ORDER — ONDANSETRON HCL 4 MG PO TABS: 4.0000 mg | ORAL_TABLET | Freq: Three times a day (TID) | ORAL | Status: DC | PRN

## 2017-01-20 MED ORDER — LIRAGLUTIDE 18 MG/3ML ~~LOC~~ SOPN: 7.2000 mg | PEN_INJECTOR | Freq: Every day | SUBCUTANEOUS | Status: DC

## 2017-01-20 MED ORDER — ALUM & MAG HYDROXIDE-SIMETH 200-200-20 MG/5ML PO SUSP: 30.0000 mL | Freq: Four times a day (QID) | ORAL | Status: DC | PRN

## 2017-01-20 MED ORDER — QUETIAPINE FUMARATE 100 MG PO TABS
100.0000 mg | ORAL_TABLET | Freq: Every day | ORAL | Status: DC
  Administered 2017-01-20: 100 mg via ORAL
  Filled 2017-01-20: qty 1

## 2017-01-20 MED ORDER — ACETAMINOPHEN 325 MG PO TABS
650.0000 mg | ORAL_TABLET | ORAL | Status: DC | PRN
  Administered 2017-01-20: 650 mg via ORAL
  Filled 2017-01-20: qty 2

## 2017-01-20 NOTE — ED Notes (Signed)
Bed: [facility]4 Expected date:  Expected time:  Means of arrival:  Comments: 
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
## 2017-01-20 NOTE — ED Notes (Signed)
Bed: [facility] Expected date:  Expected time:  Means of arrival:  Comments: 
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
## 2017-01-20 NOTE — ED Notes (Signed)
Pt A&O x 3, no distress noted, calm & cooperative.  Monitoring for safety, Q 15 min checks in effect.

## 2017-01-21 DIAGNOSIS — G4700 Insomnia, unspecified: Secondary | ICD-10-CM

## 2017-01-21 DIAGNOSIS — F419 Anxiety disorder, unspecified: Secondary | ICD-10-CM

## 2017-01-21 DIAGNOSIS — F1099 Alcohol use, unspecified with unspecified alcohol-induced disorder: Secondary | ICD-10-CM

## 2017-01-21 DIAGNOSIS — R450 Nervousness: Secondary | ICD-10-CM

## 2017-01-21 DIAGNOSIS — F3112 Bipolar disorder, current episode manic without psychotic features, moderate: Secondary | ICD-10-CM

## 2017-01-21 LAB — CBG MONITORING, ED: Glucose-Capillary: 171 mg/dL — ABNORMAL HIGH (ref 65–99)

## 2017-01-21 MED ORDER — QUETIAPINE FUMARATE 100 MG PO TABS
200.0000 mg | ORAL_TABLET | Freq: Every day | ORAL | Status: DC
  Administered 2017-01-21: 200 mg via ORAL
  Filled 2017-01-21: qty 2

## 2017-01-21 MED ORDER — OXCARBAZEPINE 300 MG PO TABS
300.0000 mg | ORAL_TABLET | Freq: Two times a day (BID) | ORAL | Status: DC
  Administered 2017-01-21 – 2017-01-22 (×2): 300 mg via ORAL
  Filled 2017-01-21 (×2): qty 1

## 2017-01-21 NOTE — ED Notes (Signed)
Pt is calm and cooperative.  She is very drowsy today.  She says she continues to have religious thoughts that are unusual for her.  She denies S/I, H/I, and AVH.  15 minute checks and video monitoring continue.

## 2017-01-21 NOTE — ED Notes (Signed)
Hourly rounding reveals patient sleeping in room. No complaints, stable, in no acute distress. Q15 minute rounds and monitoring via Security Cameras to continue. 

## 2017-01-21 NOTE — Consult Note (Signed)
[facility] Face-to-Face Psychiatry Consult   Reason for Consult:  Suicidal ideations Referring Physician:  EDP Patient Identification: Jessica Guzman MRN:  [id number] Principal Diagnosis: Bipolar disorder, curr episode manic w/o psychotic features, moderate ([place]) Diagnosis:   Patient Active Problem List   Diagnosis Date Noted  . Bipolar disorder, curr episode manic w/o psychotic features, moderate ([place]) [F31.12] 01/21/2017    Priority: High    Total Time spent with patient: 45 minutes  Subjective:   Jessica Guzman is a 56 y.o. female patient will have medication changes and observed for safety.  HPI:  57 yo female who presented to the ED with an increase in depression and suicidal ideations.  This morning, she denied suicidal/homicidal ideations, past attempts negative, and no hallucinations.  Irritable and reporting lack of sleep.  Agreeable to have her medications adjusted and be observed for stability.  Past Psychiatric History: bipolar disorder  Risk to Self: Suicidal Ideation: No-Not Currently/Within Last 6 Months(Pt reported, yesteday. ) Suicidal Intent: Yes-Currently Present Is patient at risk for suicide?: Yes Suicidal Plan?: Yes-Currently Present Specify Current Suicidal Plan: Pt ghad a plan to run her car into another car, and to cut her wrist the "right way."  Access to Means: Yes Specify Access to Suicidal Means: Pt has a car and kitchen knives.  What has been your use of drugs/alcohol within the last 12 months?: Pt denie. UDS is negative.  How many times?: 0 Other Self Harm Risks: Pt denies.  Triggers for Past Attempts: None known Intentional Self Injurious Behavior: None(Pt denies.) Risk to Others: Homicidal Ideation: No(Pt denies. ) Thoughts of Harm to Others: No Current Homicidal Intent: No Current Homicidal Plan: No Access to Homicidal Means: No Identified Victim: NA History of harm to others?: No Assessment of Violence: None Noted Violent Behavior Description:  NA Does patient have access to weapons?: Yes (Comment)(knives.) Criminal Charges Pending?: No Does patient have a court date: No Prior Inpatient Therapy: Prior Inpatient Therapy: Yes Prior Therapy Dates: 1993 and 12/2015. Prior Therapy Facilty/Provider(s): [place], [place] and [facility] Reason for Treatment: Bi-polar. Prior Outpatient Therapy: Prior Outpatient Therapy: Yes Prior Therapy Dates: Current. Prior Therapy Facilty/Provider(s): [facility]. Reason for Treatment: Medication management and counseling. Does patient have an ACCT team?: No Does patient have Intensive In-House Services?  : No Does patient have [facility] services? : No Does patient have P4CC services?: No  Past Medical History:  Past Medical History:  Diagnosis Date  . Bipolar 1 disorder ([place])   . Diabetes mellitus without complication ([place])   . Hyperlipidemia   . Hypertension   . PTSD (post-traumatic stress disorder)     Past Surgical History:  Procedure Laterality Date  . PCOS    . TUBAL LIGATION     Family History:  Family History  Problem Relation Age of Onset  . Hypertension Other    Family Psychiatric  History: none Social History:  Social History   Substance and Sexual Activity  Alcohol Use Yes   Comment: rarely     Social History   Substance and Sexual Activity  Drug Use Not on file    Social History   Socioeconomic History  . Marital status: Single    Spouse name: None  . Number of children: None  . Years of education: None  . Highest education level: None  Social Needs  . Financial resource strain: None  . Food insecurity - worry: None  . Food insecurity - inability: None  . Transportation needs - medical:  None  . Transportation needs - non-medical: None  Occupational History  . None  Tobacco Use  . Smoking status: Never Smoker  . Smokeless tobacco: Never Used  Substance and Sexual Activity  . Alcohol use: Yes    Comment: rarely  . Drug use: None  . Sexual  activity: None  Other Topics Concern  . None  Social History Narrative  . None   Additional Social History:    Allergies:   Allergies  Allergen Reactions  . Codeine Nausea And Vomiting    Labs:  Results for orders placed or performed during the hospital encounter of 01/20/17 (from the past 48 hour(s))  Rapid urine drug screen (hospital performed)     Status: None   Collection Time: 01/20/17  5:01 PM  Result Value Ref Range   Opiates NONE DETECTED NONE DETECTED   Cocaine NONE DETECTED NONE DETECTED   Benzodiazepines NONE DETECTED NONE DETECTED   Amphetamines NONE DETECTED NONE DETECTED   Tetrahydrocannabinol NONE DETECTED NONE DETECTED   Barbiturates NONE DETECTED NONE DETECTED    Comment:        DRUG SCREEN FOR MEDICAL PURPOSES ONLY.  IF CONFIRMATION IS NEEDED FOR ANY PURPOSE, NOTIFY LAB WITHIN 5 DAYS.        LOWEST DETECTABLE LIMITS FOR URINE DRUG SCREEN Drug Class       Cutoff (ng/mL) Amphetamine      1000 Barbiturate      200 Benzodiazepine   299 Tricyclics       371 Opiates          300 Cocaine          300 THC              50   Comprehensive metabolic panel     Status: Abnormal   Collection Time: 01/20/17  5:27 PM  Result Value Ref Range   Sodium 136 135 - 145 mmol/L   Potassium 4.4 3.5 - 5.1 mmol/L   Chloride 101 101 - 111 mmol/L   CO2 27 22 - 32 mmol/L   Glucose, Bld 160 (H) 65 - 99 mg/dL   BUN 13 6 - 20 mg/dL   Creatinine, Ser 0.62 0.44 - 1.00 mg/dL   Calcium 9.8 8.9 - 10.3 mg/dL   Total Protein 8.0 6.5 - 8.1 g/dL   Albumin 4.6 3.5 - 5.0 g/dL   AST 32 15 - 41 U/L   ALT 23 14 - 54 U/L   Alkaline Phosphatase 81 38 - 126 U/L   Total Bilirubin 0.8 0.3 - 1.2 mg/dL   GFR calc non Af Amer >60 >60 mL/min   GFR calc Af Amer >60 >60 mL/min    Comment: (NOTE) The eGFR has been calculated using the CKD EPI equation. This calculation has not been validated in all clinical situations. eGFR's persistently <60 mL/min signify possible Chronic Kidney Disease.     Anion gap 8 5 - 15  Ethanol     Status: None   Collection Time: 01/20/17  5:27 PM  Result Value Ref Range   Alcohol, Ethyl (B) <10 <10 mg/dL    Comment:        LOWEST DETECTABLE LIMIT FOR SERUM ALCOHOL IS 10 mg/dL FOR MEDICAL PURPOSES ONLY   Salicylate level     Status: None   Collection Time: 01/20/17  5:27 PM  Result Value Ref Range   Salicylate Lvl <6.9 2.8 - 30.0 mg/dL  Acetaminophen level     Status: Abnormal   Collection Time:  01/20/17  5:27 PM  Result Value Ref Range   Acetaminophen (Tylenol), Serum <10 (L) 10 - 30 ug/mL    Comment:        THERAPEUTIC CONCENTRATIONS VARY SIGNIFICANTLY. A RANGE OF 10-30 ug/mL MAY BE AN EFFECTIVE CONCENTRATION FOR MANY PATIENTS. HOWEVER, SOME ARE BEST TREATED AT CONCENTRATIONS OUTSIDE THIS RANGE. ACETAMINOPHEN CONCENTRATIONS >150 ug/mL AT 4 HOURS AFTER INGESTION AND >50 ug/mL AT 12 HOURS AFTER INGESTION ARE OFTEN ASSOCIATED WITH TOXIC REACTIONS.   cbc     Status: Abnormal   Collection Time: 01/20/17  5:27 PM  Result Value Ref Range   WBC 12.6 (H) 4.0 - 10.5 K/uL   RBC 5.36 (H) 3.87 - 5.11 MIL/uL   Hemoglobin 14.4 12.0 - 15.0 g/dL   HCT 43.4 36.0 - 46.0 %   MCV 81.0 78.0 - 100.0 fL   MCH 26.9 26.0 - 34.0 pg   MCHC 33.2 30.0 - 36.0 g/dL   RDW 14.5 11.5 - 15.5 %   Platelets 338 150 - 400 K/uL  I-Stat beta hCG blood, ED     Status: None   Collection Time: 01/20/17  5:37 PM  Result Value Ref Range   I-stat hCG, quantitative <5.0 <5 mIU/mL   Comment 3            Comment:   GEST. AGE      CONC.  (mIU/mL)   <=1 WEEK        5 - 50     2 WEEKS       50 - 500     3 WEEKS       100 - 10,000     4 WEEKS     1,000 - 30,000        FEMALE AND NON-PREGNANT FEMALE:     LESS THAN 5 mIU/mL   CBG monitoring, ED     Status: Abnormal   Collection Time: 01/20/17  8:52 PM  Result Value Ref Range   Glucose-Capillary 186 (H) 65 - 99 mg/dL    Current Facility-Administered Medications  Medication Dose Route Frequency Provider Last Rate Last  Dose  . acetaminophen (TYLENOL) tablet 650 mg  650 mg Oral Q4H PRN Margarita Mail, PA-C   650 mg at 01/20/17 1907  . alum & mag hydroxide-simeth (MAALOX/MYLANTA) 200-200-20 MG/5ML suspension 30 mL  30 mL Oral Q6H PRN Harris, Abigail, PA-C      . cholecalciferol (VITAMIN D) tablet 2,000 Units  2,000 Units Oral Daily Harris, Abigail, PA-C   2,000 Units at 01/21/17 1019  . insulin glargine (LANTUS) injection 16 Units  16 Units Subcutaneous QHS Margarita Mail, PA-C   16 Units at 01/20/17 2204  . metFORMIN (GLUCOPHAGE) tablet 1,000 mg  1,000 mg Oral BID WC Harris, Abigail, PA-C   1,000 mg at 01/21/17 0842  . ondansetron (ZOFRAN) tablet 4 mg  4 mg Oral Q8H PRN Margarita Mail, PA-C      . OXcarbazepine (TRILEPTAL) tablet 150 mg  150 mg Oral BID Harris, Abigail, PA-C   150 mg at 01/21/17 1019  . PARoxetine (PAXIL) tablet 20 mg  20 mg Oral QPM Harris, Abigail, PA-C   20 mg at 01/20/17 2149  . QUEtiapine (SEROQUEL) tablet 100 mg  100 mg Oral QHS Harris, Abigail, PA-C   100 mg at 01/20/17 2148   Current Outpatient Medications  Medication Sig Dispense Refill  . bismuth subsalicylate (PEPTO BISMOL) 262 MG chewable tablet Chew 524 mg daily as needed by mouth.    . cholecalciferol (  VITAMIN D) 1000 UNITS tablet Take 2,000 Units by mouth daily.     . fluticasone (FLONASE) 50 MCG/ACT nasal spray Place 2 sprays daily into both nostrils.    . insulin glargine (LANTUS) 100 UNIT/ML injection Inject 20 Units at bedtime into the skin.     . metFORMIN (GLUCOPHAGE) 1000 MG tablet Take 1,000 mg by mouth 2 (two) times daily with a meal.    . OXcarbazepine (TRILEPTAL) 150 MG tablet Take 150 mg by mouth 2 (two) times daily.    Marland Kitchen PARoxetine (PAXIL) 20 MG tablet Take 20 mg by mouth every evening.     Marland Kitchen QUEtiapine (SEROQUEL) 100 MG tablet Take 100 mg by mouth at bedtime.    Marland Kitchen oxyCODONE-acetaminophen (PERCOCET/ROXICET) 5-325 MG tablet Take 1 tablet by mouth every 6 (six) hours as needed for severe pain. (Patient not taking:  Reported on 01/20/2017) 10 tablet 0    Musculoskeletal: Strength & Muscle Tone: within normal limits Gait & Station: normal Patient leans: N/A  Psychiatric Specialty Exam: Physical Exam  Constitutional: She is oriented to person, place, and time. She appears well-developed and well-nourished.  HENT:  Head: Normocephalic.  Neck: Normal range of motion.  Respiratory: Effort normal.  Musculoskeletal: Normal range of motion.  Neurological: She is alert and oriented to person, place, and time.  Psychiatric: Her speech is normal and behavior is normal. Judgment and thought content normal. Her mood appears anxious. Cognition and memory are normal. She exhibits a depressed mood.    Review of Systems  Psychiatric/Behavioral: Positive for depression. The patient is nervous/anxious and has insomnia.   All other systems reviewed and are negative.   Blood pressure (!) 121/58, pulse 66, temperature 98.6 F (37 C), temperature source Oral, resp. rate 18, SpO2 96 %.There is no height or weight on file to calculate BMI.  General Appearance: Casual  Eye Contact:  Fair  Speech:  Normal Rate  Volume:  Normal  Mood:  Depressed  Affect:  Congruent  Thought Process:  Coherent and Descriptions of Associations: Intact  Orientation:  Full (Time, Place, and Person)  Thought Content:  Rumination  Suicidal Thoughts:  No  Homicidal Thoughts:  No  Memory:  Immediate;   Fair Recent;   Fair Remote;   Fair  Judgement:  Fair  Insight:  Fair  Psychomotor Activity:  Decreased  Concentration:  Concentration: Fair and Attention Span: Fair  Recall:  [employer] of Knowledge:  Fair  Language:  Good  Akathisia:  No  Handed:  Right  AIMS (if indicated):     Assets:  Housing Leisure Time Physical Health Resilience Social Support  ADL's:  Intact  Cognition:  WNL  Sleep:        Treatment Plan Summary: Daily contact with patient to assess and evaluate symptoms and progress in treatment, Medication  management and Plan bipolar affective disorder, mania, mild:  -Crisis stabilization -Medication management:  Increased her Trileptal from 150 mg BID to 300 mg BID and increased her Seroquel 100 mg at bedtime for sleep/mood to 200 mg, continued her medical medications and Paxil 20 mg daily for depression -Individual counseling  Disposition: Supportive therapy provided about ongoing stressors.  Waylan Boga, NP 01/21/2017 4:48 PM  Patient seen face-to-face for psychiatric evaluation, chart reviewed and case discussed with the physician extender and developed treatment plan. Reviewed the information documented and agree with the treatment plan. Corena Pilgrim, MD

## 2017-01-21 NOTE — ED Notes (Signed)
Report to include situation, background, assessment and recommendations from [facility]. Patient sleeping, respirations regular and unlabored. Q15 minute rounds and security camera observation to continue.

## 2017-01-21 NOTE — Progress Notes (Signed)
01/21/17 1353:  LRT introduced self to pt and offered activities.  Pt was pleasant and bright.  LRT and pt played checkers in the dayroom.  Pt was focused and concentrated on the activity.  Pt requested some crossword puzzles for later.  LRT printed off crosswords for pt.     Victorino Sparrow, LRT/CTRS

## 2017-01-22 LAB — CBG MONITORING, ED: Glucose-Capillary: 133 mg/dL — ABNORMAL HIGH (ref 65–99)

## 2017-01-22 MED ORDER — OXCARBAZEPINE 300 MG PO TABS: 300.0000 mg | ORAL_TABLET | Freq: Two times a day (BID) | ORAL | 0 refills | Status: DC

## 2017-01-22 MED ORDER — QUETIAPINE FUMARATE 200 MG PO TABS
200.0000 mg | ORAL_TABLET | Freq: Every day | ORAL | 0 refills | Status: DC
Start: 2017-01-22 — End: 2021-01-28

## 2017-01-22 NOTE — ED Notes (Signed)
Hourly rounding reveals patient sleeping in room. No complaints, stable, in no acute distress. Q15 minute rounds and monitoring via Security Cameras to continue. 

## 2017-01-22 NOTE — [facility] Suicide Risk Assessment (Signed)
Suicide Risk Assessment  Discharge Assessment   [facility] Discharge Suicide Risk Assessment   Principal Problem: Bipolar disorder, curr episode manic w/o psychotic features, moderate ([place]) Discharge Diagnoses:  Patient Active Problem List   Diagnosis Date Noted  . Bipolar disorder, curr episode manic w/o psychotic features, moderate ([place]) [F31.12] 01/21/2017    Priority: High    Total Time spent with patient: 30 minutes  Musculoskeletal: Strength & Muscle Tone: within normal limits Gait & Station: normal Patient leans: N/A  Psychiatric Specialty Exam: Physical Exam  Constitutional: She is oriented to person, place, and time. She appears well-developed and well-nourished.  HENT:  Head: Normocephalic.  Neck: Normal range of motion.  Respiratory: Effort normal.  Musculoskeletal: Normal range of motion.  Neurological: She is alert and oriented to person, place, and time.  Psychiatric: Her speech is normal and behavior is normal. Judgment and thought content normal. Cognition and memory are normal. She exhibits a depressed mood.    Review of Systems  Psychiatric/Behavioral: Positive for depression.  All other systems reviewed and are negative.   Blood pressure 121/61, pulse 65, temperature 98.3 F (36.8 C), temperature source Oral, resp. rate 18, SpO2 96 %.There is no height or weight on file to calculate BMI.  General Appearance: Casual  Eye Contact:  Good  Speech:  Normal Rate  Volume:  Normal  Mood:  Depressed, mild  Affect:  Congruent  Thought Process:  Coherent and Descriptions of Associations: Intact  Orientation:  Full (Time, Place, and Person)  Thought Content:  WDL  Suicidal Thoughts:  No  Homicidal Thoughts:  No  Memory: Immediate, good; recent, good; remote, good  Judgement:  Good  Insight:  Good  Psychomotor Activity:  Normal  Concentration:  Concentration and attention span, good  Recall:  Good  Fund of Knowledge:  Good  Language:  Good  Akathisia:  No   Handed:  Right  AIMS (if indicated):     Assets:  Housing Leisure Time Physical Health Resilience Social Support  ADL's:  Intact  Cognition:  WNL  Sleep:      Mental Status Per Nursing Assessment::   On Admission:   suicidal ideations  Demographic Factors:  Caucasian and Living alone  Loss Factors: NA  Historical Factors: NA  Risk Reduction Factors:   Sense of responsibility to family, Positive social support and Positive therapeutic relationship  Continued Clinical Symptoms:  Depression, mild  Cognitive Features That Contribute To Risk:  None    Suicide Risk:  Minimal: No identifiable suicidal ideation.  Patients presenting with no risk factors but with morbid ruminations; may be classified as minimal risk based on the severity of the depressive symptoms    Plan Of Care/Follow-up recommendations:  Activity:  as tolerated Diet:  heart healhty diet  LORD, JAMISON, NP 01/22/2017, 10:26 AM

## 2017-01-22 NOTE — ED Notes (Signed)
Written dc instructions and prescriptions reviewed w/ pt.  Pt encouraged to take medications as directed, contact/see her therapist as soon as possible, and return for any changes or return of suicidal thoughts.  Pt verbalized understanding.  Pt up to the phone to call for ride home.

## 2017-01-22 NOTE — Consult Note (Signed)
[facility] Face-to-Face Psychiatry Consult   Reason for Consult:  Suicidal ideations Referring Physician:  EDP Patient Identification: Jessica Guzman MRN:  [id number] Principal Diagnosis: Bipolar disorder, curr episode manic w/o psychotic features, moderate (HCC) Diagnosis:   Patient Active Problem List   Diagnosis Date Noted  . Bipolar disorder, curr episode manic w/o psychotic features, moderate (HCC) [F31.12] 01/21/2017    Priority: High    Total Time spent with patient: 30 minutes  Subjective:   Jessica Guzman is a 56 y.o. female patient is stable for discharge.  HPI:  56 yo female who presented to the ED with an increase in depression and suicidal ideations.  Her medications were adjusted and she stabilized; she slept last night.  Continues to deny suicidal/homicidal ideations, hallucinations, and substance abuse.  Her sister did get her power back on and she now has heat.  Denies any safety concerns, stable for discharge.  Past Psychiatric History: bipolar disorder  Risk to Self: NOne Risk to Others: Homicidal Ideation: No(Pt denies. ) Thoughts of Harm to Others: No Current Homicidal Intent: No Current Homicidal Plan: No Access to Homicidal Means: No Identified Victim: NA History of harm to others?: No Assessment of Violence: None Noted Violent Behavior Description: NA Does patient have access to weapons?: Yes (Comment)(knives.) Criminal Charges Pending?: No Does patient have a court date: No Prior Inpatient Therapy: Prior Inpatient Therapy: Yes Prior Therapy Dates: 1993 and 12/2015. Prior Therapy Facilty/Provider(s): [place], [place] and [place] Reason for Treatment: Bi-polar. Prior Outpatient Therapy: Prior Outpatient Therapy: Yes Prior Therapy Dates: Current. Prior Therapy Facilty/Provider([facility]): [facility]. Reason for Treatment: Medication management and counseling. Does patient have an ACCT team?: No Does patient have Intensive In-House Services?  : No Does  patient have [facility] services? : No Does patient have P4CC services?: No  Past Medical History:  Past Medical History:  Diagnosis Date  . Bipolar 1 disorder (HCC)   . Diabetes mellitus without complication (HCC)   . Hyperlipidemia   . Hypertension   . PTSD (post-traumatic stress disorder)     Past Surgical History:  Procedure Laterality Date  . PCOS    . TUBAL LIGATION     Family History:  Family History  Problem Relation Age of Onset  . Hypertension Other    Family Psychiatric  History: none Social History:  Social History   Substance and Sexual Activity  Alcohol Use Yes   Comment: rarely     Social History   Substance and Sexual Activity  Drug Use Not on file    Social History   Socioeconomic History  . Marital status: Single    Spouse name: None  . Number of children: None  . Years of education: None  . Highest education level: None  Social Needs  . Financial resource strain: None  . Food insecurity - worry: None  . Food insecurity - inability: None  . Transportation needs - medical: None  . Transportation needs - non-medical: None  Occupational History  . None  Tobacco Use  . Smoking status: Never Smoker  . Smokeless tobacco: Never Used  Substance and Sexual Activity  . Alcohol use: Yes    Comment: rarely  . Drug use: None  . Sexual activity: None  Other Topics Concern  . None  Social History Narrative  . None   Additional Social History:    Allergies:   Allergies  Allergen Reactions  . Codeine Nausea And Vomiting    Labs:  Results for orders placed   or performed during the hospital encounter of 01/20/17 (from the past 48 hour(s))  Rapid urine drug screen (hospital performed)     Status: None   Collection Time: 01/20/17  5:01 PM  Result Value Ref Range   Opiates NONE DETECTED NONE DETECTED   Cocaine NONE DETECTED NONE DETECTED   Benzodiazepines NONE DETECTED NONE DETECTED   Amphetamines NONE DETECTED NONE DETECTED    Tetrahydrocannabinol NONE DETECTED NONE DETECTED   Barbiturates NONE DETECTED NONE DETECTED    Comment:        DRUG SCREEN FOR MEDICAL PURPOSES ONLY.  IF CONFIRMATION IS NEEDED FOR ANY PURPOSE, NOTIFY LAB WITHIN 5 DAYS.        LOWEST DETECTABLE LIMITS FOR URINE DRUG SCREEN Drug Class       Cutoff (ng/mL) Amphetamine      1000 Barbiturate      200 Benzodiazepine   200 Tricyclics       300 Opiates          300 Cocaine          300 THC              50   Comprehensive metabolic panel     Status: Abnormal   Collection Time: 01/20/17  5:27 PM  Result Value Ref Range   Sodium 136 135 - 145 mmol/L   Potassium 4.4 3.5 - 5.1 mmol/L   Chloride 101 101 - 111 mmol/L   CO2 27 22 - 32 mmol/L   Glucose, Bld 160 (H) 65 - 99 mg/dL   BUN 13 6 - 20 mg/dL   Creatinine, Ser 0.62 0.44 - 1.00 mg/dL   Calcium 9.8 8.9 - 10.3 mg/dL   Total Protein 8.0 6.5 - 8.1 g/dL   Albumin 4.6 3.5 - 5.0 g/dL   AST 32 15 - 41 U/L   ALT 23 14 - 54 U/L   Alkaline Phosphatase 81 38 - 126 U/L   Total Bilirubin 0.8 0.3 - 1.2 mg/dL   GFR calc non Af Amer >60 >60 mL/min   GFR calc Af Amer >60 >60 mL/min    Comment: (NOTE) The eGFR has been calculated using the CKD EPI equation. This calculation has not been validated in all clinical situations. eGFR's persistently <60 mL/min signify possible Chronic Kidney Disease.    Anion gap 8 5 - 15  Ethanol     Status: None   Collection Time: 01/20/17  5:27 PM  Result Value Ref Range   Alcohol, Ethyl (B) <10 <10 mg/dL    Comment:        LOWEST DETECTABLE LIMIT FOR SERUM ALCOHOL IS 10 mg/dL FOR MEDICAL PURPOSES ONLY   Salicylate level     Status: None   Collection Time: 01/20/17  5:27 PM  Result Value Ref Range   Salicylate Lvl <7.0 2.8 - 30.0 mg/dL  Acetaminophen level     Status: Abnormal   Collection Time: 01/20/17  5:27 PM  Result Value Ref Range   Acetaminophen (Tylenol), Serum <10 (L) 10 - 30 ug/mL    Comment:        THERAPEUTIC CONCENTRATIONS  VARY SIGNIFICANTLY. A RANGE OF 10-30 ug/mL MAY BE AN EFFECTIVE CONCENTRATION FOR MANY PATIENTS. HOWEVER, SOME ARE BEST TREATED AT CONCENTRATIONS OUTSIDE THIS RANGE. ACETAMINOPHEN CONCENTRATIONS >150 ug/mL AT 4 HOURS AFTER INGESTION AND >50 ug/mL AT 12 HOURS AFTER INGESTION ARE OFTEN ASSOCIATED WITH TOXIC REACTIONS.   cbc     Status: Abnormal   Collection Time: 01/20/17  5:27 PM    Result Value Ref Range   WBC 12.6 (H) 4.0 - 10.5 K/uL   RBC 5.36 (H) 3.87 - 5.11 MIL/uL   Hemoglobin 14.4 12.0 - 15.0 g/dL   HCT 43.4 36.0 - 46.0 %   MCV 81.0 78.0 - 100.0 fL   MCH 26.9 26.0 - 34.0 pg   MCHC 33.2 30.0 - 36.0 g/dL   RDW 14.5 11.5 - 15.5 %   Platelets 338 150 - 400 K/uL  I-Stat beta hCG blood, ED     Status: None   Collection Time: 01/20/17  5:37 PM  Result Value Ref Range   I-stat hCG, quantitative <5.0 <5 mIU/mL   Comment 3            Comment:   GEST. AGE      CONC.  (mIU/mL)   <=1 WEEK        5 - 50     2 WEEKS       50 - 500     3 WEEKS       100 - 10,000     4 WEEKS     1,000 - 30,000        FEMALE AND NON-PREGNANT FEMALE:     LESS THAN 5 mIU/mL   CBG monitoring, ED     Status: Abnormal   Collection Time: 01/20/17  8:52 PM  Result Value Ref Range   Glucose-Capillary 186 (H) 65 - 99 mg/dL  CBG monitoring, ED     Status: Abnormal   Collection Time: 01/21/17  9:10 PM  Result Value Ref Range   Glucose-Capillary 171 (H) 65 - 99 mg/dL   Comment 1 Notify RN   CBG monitoring, ED     Status: Abnormal   Collection Time: 01/22/17  7:45 AM  Result Value Ref Range   Glucose-Capillary 133 (H) 65 - 99 mg/dL    Current Facility-Administered Medications  Medication Dose Route Frequency Provider Last Rate Last Dose  . acetaminophen (TYLENOL) tablet 650 mg  650 mg Oral Q4H PRN Margarita Mail, PA-C   650 mg at 01/20/17 1907  . alum & mag hydroxide-simeth (MAALOX/MYLANTA) 200-200-20 MG/5ML suspension 30 mL  30 mL Oral Q6H PRN Harris, Abigail, PA-C      . cholecalciferol (VITAMIN D)  tablet 2,000 Units  2,000 Units Oral Daily Harris, Abigail, PA-C   2,000 Units at 01/22/17 1014  . insulin glargine (LANTUS) injection 16 Units  16 Units Subcutaneous QHS Margarita Mail, PA-C   16 Units at 01/21/17 2126  . metFORMIN (GLUCOPHAGE) tablet 1,000 mg  1,000 mg Oral BID WC Harris, Abigail, PA-C   1,000 mg at 01/22/17 0853  . ondansetron (ZOFRAN) tablet 4 mg  4 mg Oral Q8H PRN Margarita Mail, PA-C      . Oxcarbazepine (TRILEPTAL) tablet 300 mg  300 mg Oral BID Patrecia Pour, NP   300 mg at 01/22/17 1014  . PARoxetine (PAXIL) tablet 20 mg  20 mg Oral QPM Harris, Abigail, PA-C   20 mg at 01/21/17 1728  . QUEtiapine (SEROQUEL) tablet 200 mg  200 mg Oral QHS Patrecia Pour, NP   200 mg at 01/21/17 2128   Current Outpatient Medications  Medication Sig Dispense Refill  . bismuth subsalicylate (PEPTO BISMOL) 262 MG chewable tablet Chew 524 mg daily as needed by mouth.    . cholecalciferol (VITAMIN D) 1000 UNITS tablet Take 2,000 Units by mouth daily.     . fluticasone (FLONASE) 50 MCG/ACT nasal spray Place 2 sprays daily  into both nostrils.    . insulin glargine (LANTUS) 100 UNIT/ML injection Inject 20 Units at bedtime into the skin.     . metFORMIN (GLUCOPHAGE) 1000 MG tablet Take 1,000 mg by mouth 2 (two) times daily with a meal.    . OXcarbazepine (TRILEPTAL) 150 MG tablet Take 150 mg by mouth 2 (two) times daily.    Marland Kitchen PARoxetine (PAXIL) 20 MG tablet Take 20 mg by mouth every evening.     Marland Kitchen QUEtiapine (SEROQUEL) 100 MG tablet Take 100 mg by mouth at bedtime.    Marland Kitchen oxyCODONE-acetaminophen (PERCOCET/ROXICET) 5-325 MG tablet Take 1 tablet by mouth every 6 (six) hours as needed for severe pain. (Patient not taking: Reported on 01/20/2017) 10 tablet 0    Musculoskeletal: Strength & Muscle Tone: within normal limits Gait & Station: normal Patient leans: N/A  Psychiatric Specialty Exam: Physical Exam  Constitutional: She is oriented to person, place, and time. She appears well-developed  and well-nourished.  HENT:  Head: Normocephalic.  Neck: Normal range of motion.  Respiratory: Effort normal.  Musculoskeletal: Normal range of motion.  Neurological: She is alert and oriented to person, place, and time.  Psychiatric: Her speech is normal and behavior is normal. Judgment and thought content normal. Cognition and memory are normal. She exhibits a depressed mood.    Review of Systems  Psychiatric/Behavioral: Positive for depression.  All other systems reviewed and are negative.   Blood pressure 121/61, pulse 65, temperature 98.3 F (36.8 C), temperature source Oral, resp. rate 18, SpO2 96 %.There is no height or weight on file to calculate BMI.  General Appearance: Casual  Eye Contact:  Good  Speech:  Normal Rate  Volume:  Normal  Mood:  Depressed, mild  Affect:  Congruent  Thought Process:  Coherent and Descriptions of Associations: Intact  Orientation:  Full (Time, Place, and Person)  Thought Content:  WDL  Suicidal Thoughts:  No  Homicidal Thoughts:  No  Memory: Immediate, good; recent, good; remote, good  Judgement:  Good  Insight:  Good  Psychomotor Activity:  Normal  Concentration:  Concentration and attention span, good  Recall:  Good  Fund of Knowledge:  Good  Language:  Good  Akathisia:  No  Handed:  Right  AIMS (if indicated):     Assets:  Housing Leisure Time Physical Health Resilience Social Support  ADL's:  Intact  Cognition:  WNL  Sleep:        Treatment Plan Summary: Daily contact with patient to assess and evaluate symptoms and progress in treatment, Medication management and Plan bipolar affective disorder, mania, mild:  -Crisis stabilization -Medication management:  Continued her Trileptal from 150 mg BID to 300 mg BID and increased her Seroquel 100 mg at bedtime for sleep/mood to 200 mg, continued her medical medications and Paxil 20 mg daily for depression -Individual counseling  Disposition: Discharge home, follow-up with [facility]  Waylan Boga, NP 01/22/2017 10:18 AM  Patient seen face-to-face for psychiatric evaluation, chart reviewed and case discussed with the physician extender and developed treatment plan. Reviewed the information documented and agree with the treatment plan. Corena Pilgrim, MD

## 2017-01-22 NOTE — ED Notes (Addendum)
Pt's ride will be here shortly.  Pt ambulatory w/o difficulty to dc area with mHt, belongings returned after leaving the area.

## 2017-11-30 ENCOUNTER — Other Ambulatory Visit: Payer: Self-pay | Admitting: Obstetrics and Gynecology

## 2017-11-30 DIAGNOSIS — Z1231 Encounter for screening mammogram for malignant neoplasm of breast: Secondary | ICD-10-CM

## 2017-12-19 ENCOUNTER — Other Ambulatory Visit: Payer: Self-pay

## 2017-12-28 LAB — CYTOLOGY - PAP
Diagnosis: UNDETERMINED
HPV: NOT DETECTED

## 2018-02-09 ENCOUNTER — Encounter (HOSPITAL_COMMUNITY): Payer: Self-pay | Admitting: *Deleted

## 2018-02-09 ENCOUNTER — Ambulatory Visit
Admission: RE | Admit: 2018-02-09 | Discharge: 2018-02-09 | Disposition: A | Discharge status: Home / Self Care | Payer: No Typology Code available for payment source | Source: Ambulatory Visit | Attending: Obstetrics and Gynecology | Admitting: Obstetrics and Gynecology

## 2018-02-09 ENCOUNTER — Encounter (HOSPITAL_COMMUNITY): Payer: Self-pay

## 2018-02-09 ENCOUNTER — Ambulatory Visit (HOSPITAL_COMMUNITY)
Admission: RE | Admit: 2018-02-09 | Discharge: 2018-02-09 | Disposition: A | Discharge status: Home / Self Care | Payer: Medicaid Other | Source: Ambulatory Visit | Attending: Obstetrics and Gynecology | Admitting: Obstetrics and Gynecology

## 2018-02-09 VITALS — BP 118/78 | Ht 62.0 in | Wt 182.0 lb

## 2018-02-09 DIAGNOSIS — Z1231 Encounter for screening mammogram for malignant neoplasm of breast: Secondary | ICD-10-CM

## 2018-02-09 DIAGNOSIS — Z1239 Encounter for other screening for malignant neoplasm of breast: Secondary | ICD-10-CM

## 2018-02-09 NOTE — Progress Notes (Signed)
No complaints today.   Pap Smear: Pap smear not completed today. Last Pap smear was 12/19/2017 at the free cervical cancer screening at the [facility] and ASCUS with negative HPV. Per patient has a history of an abnormal Pap smear in 2017 that a LEEP was completed for follow-up. Last Pap smear result is in Epic.  Physical exam: Breasts Breasts symmetrical. No skin abnormalities bilateral breasts. No nipple retraction bilateral breasts. No nipple discharge bilateral breasts. No lymphadenopathy. No lumps palpated bilateral breasts. No complaints of pain or tenderness on exam. Referred patient to the [facility] for a screening mammogram. Appointment scheduled for Thursday, February 09, 2018 at 1440.        Pelvic/Bimanual No Pap smear completed today since last Pap smear and HPV typing was 12/19/2017. Pap smear not indicated per [facility] guidelines.   Smoking History: Patient has never smoked.  Patient Navigation: Patient education provided. Access to services provided for patient through [facility] program.   Colorectal Cancer Screening: Per patient had a colonoscopy completed in 2009. No complaints today. FIT Test given to patient to complete and return to [facility].  Breast and Cervical Cancer Risk Assessment: Patient has no family history of breast cancer, known genetic mutations, or radiation treatment to the chest before age 52. Per patient has a history of cervical dysplasia. Patient has no history of being immunocompromised or DES exposure in-utero.  Risk Assessment    Risk Scores      02/09/2018   Last edited by: Loletta Parish, RN   5-year risk: 1.1 %   Lifetime risk: 7.4 %

## 2018-02-09 NOTE — Patient Instructions (Signed)
Explained breast self awareness with Jessica Guzman. Patient did not need a Pap smear today due to last Pap smear was 12/19/2017. Let patient know that her next Pap smear is due in one year due to her last Pap smear was ASCUS with negative HPV. Referred patient to the [facility] for a screening mammogram. Appointment scheduled for Thursday, February 09, 2018 at 1440. Patient aware of appointment and will be there. Let [facility] know [facility] will follow up with her within the next couple weeks with results of mammogram by letter or phone. [name] verbalized understanding.  Jakobi Thetford, Arvil Chaco, RN 1:12 PM

## 2018-03-08 IMAGING — CT CT HEAD W/O CM
3 series · 15 of 47 positions shown, 18 images · non-contrast
Comparison: None.

CLINICAL DATA: Status post syncope and fall. Hit posterior aspect
of head against tile floor. Laceration at the posterior head.
Initial encounter.

EXAM:
CT HEAD WITHOUT CONTRAST
TECHNIQUE: Contiguous axial images were obtained from the base of the skull
through the vertex without intravenous contrast.

[Series 3: head 5.0 h30s · axial · 0.41mm/px · z∈[+45,+170]mm · 9 of 30 slices shown, 12 images]
[im 3/30  brain]
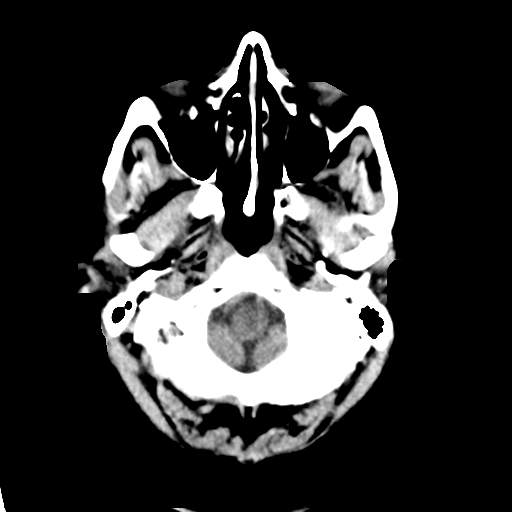
[im 3/30  bone]
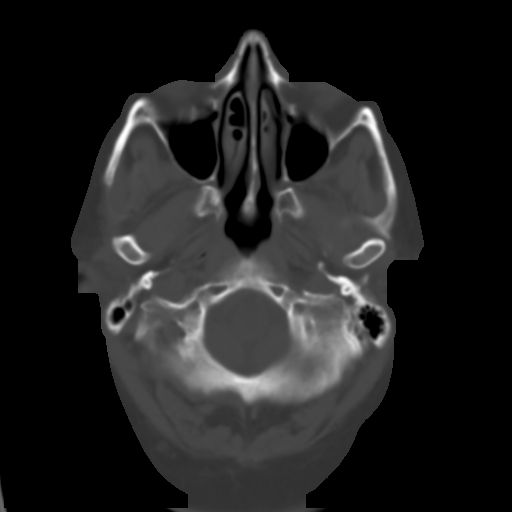
[im 6/30  brain]
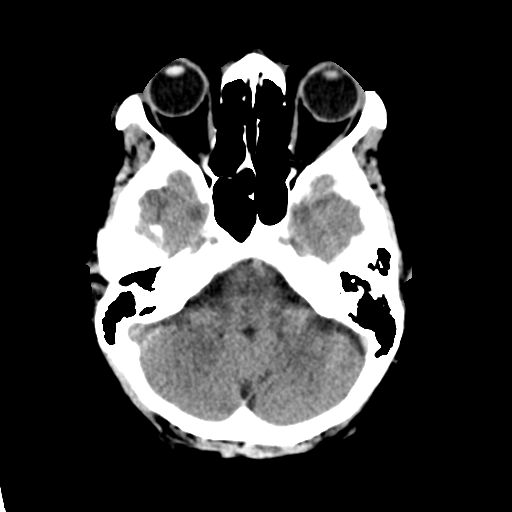
[im 9/30  brain]
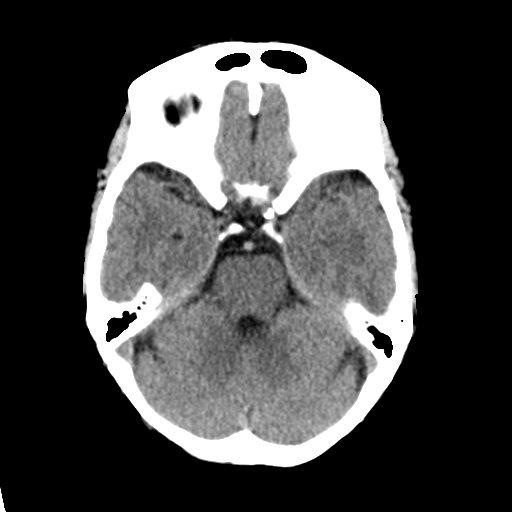
[im 12/30  brain]
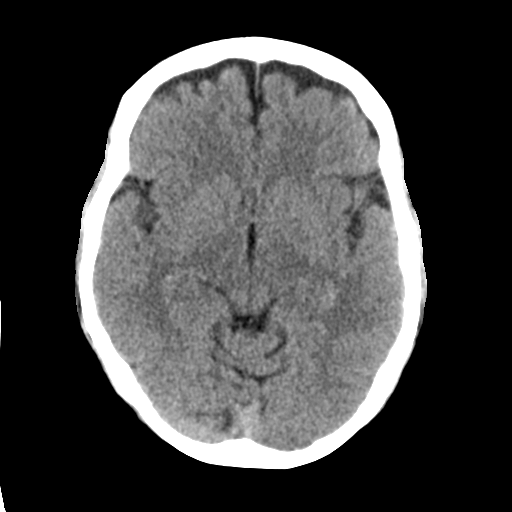
[im 16/30  brain]
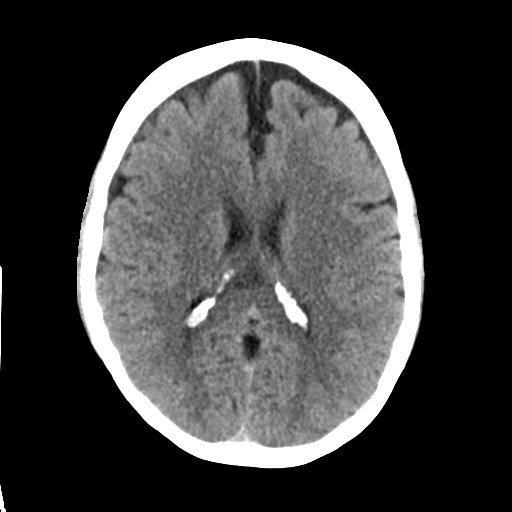
[im 16/30  bone]
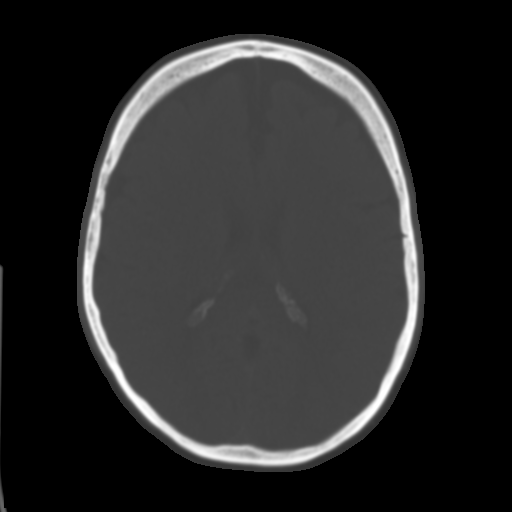
[im 19/30  brain]
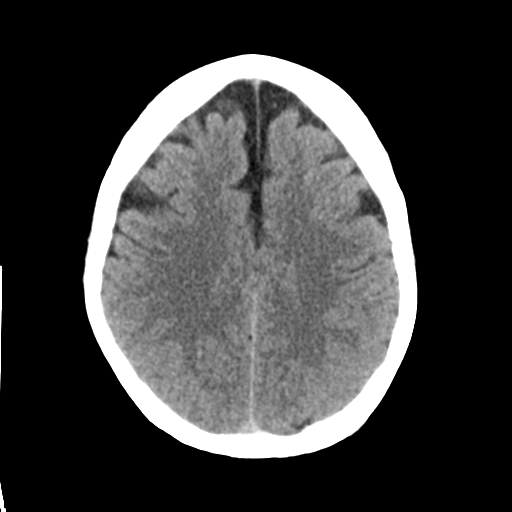
[im 22/30  brain]
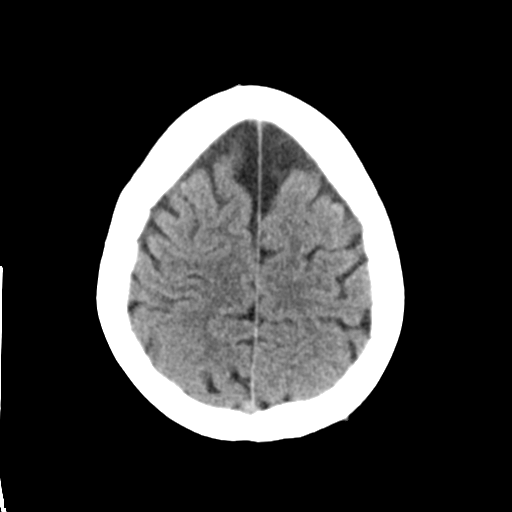
[im 25/30  brain]
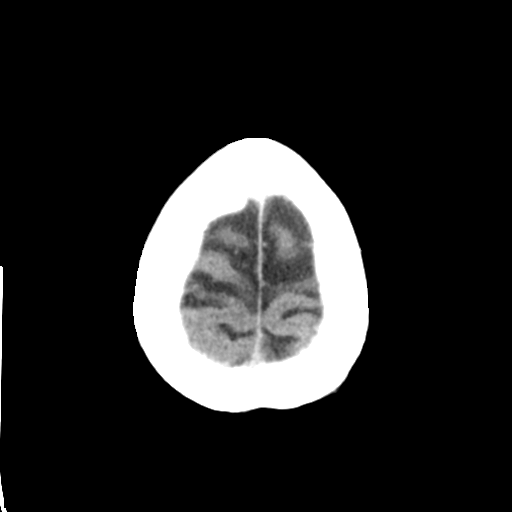
[im 28/30  brain]
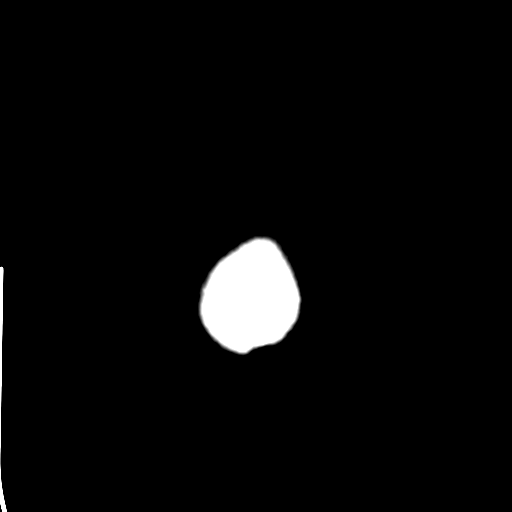
[im 28/30  bone]
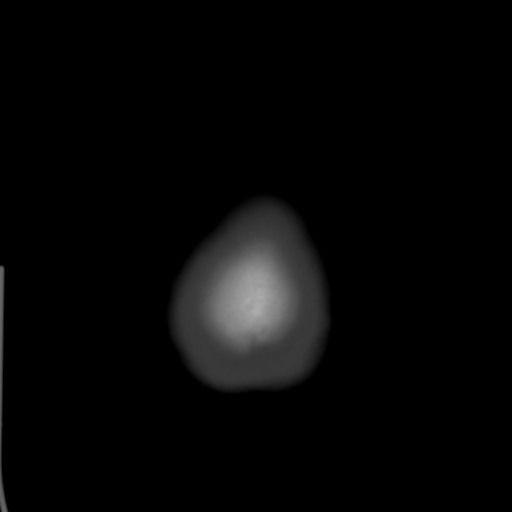

[Series 5: head 3.0 mpr cor · coronal · 0.29mm/px · 3 of 67 slices shown]
[im 23/67  brain]
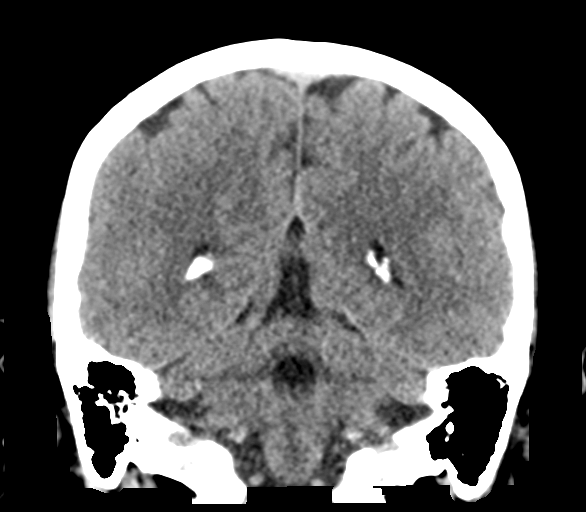
[im 30/67  brain]
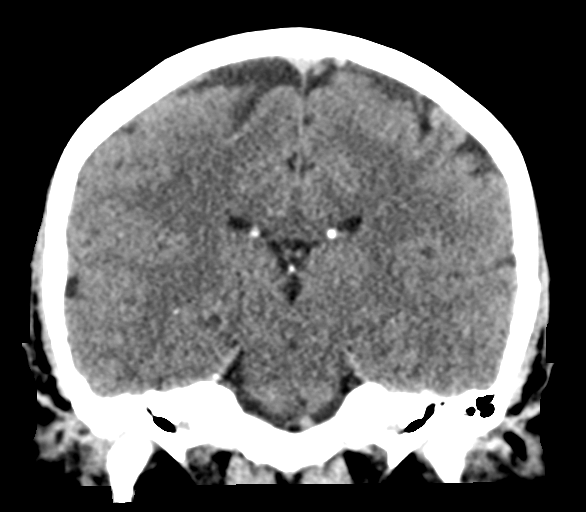
[im 37/67  brain]
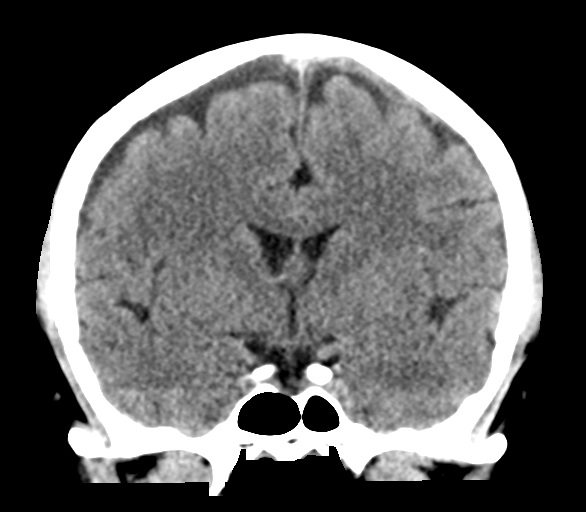

[Series 6: head 3.0 mpr sag · sagittal · 0.29mm/px · 3 of 57 slices shown]
[im 19/57  brain]
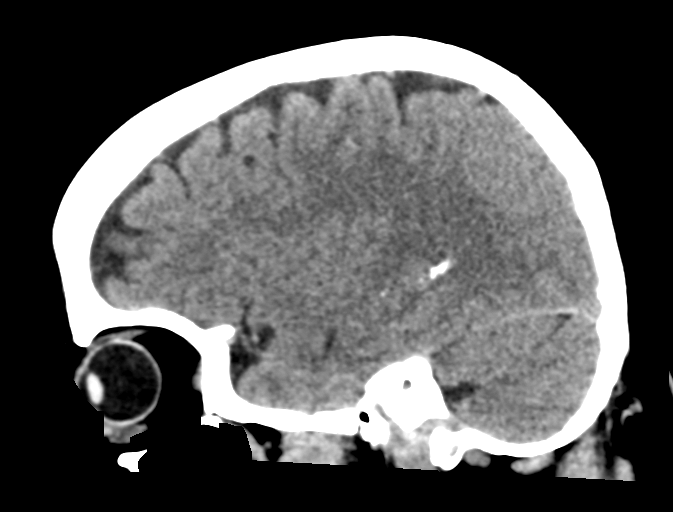
[im 29/57  brain]
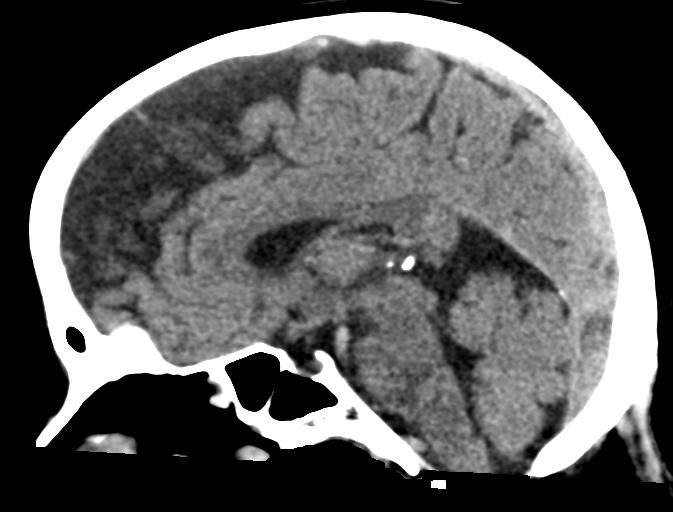
[im 38/57  brain]
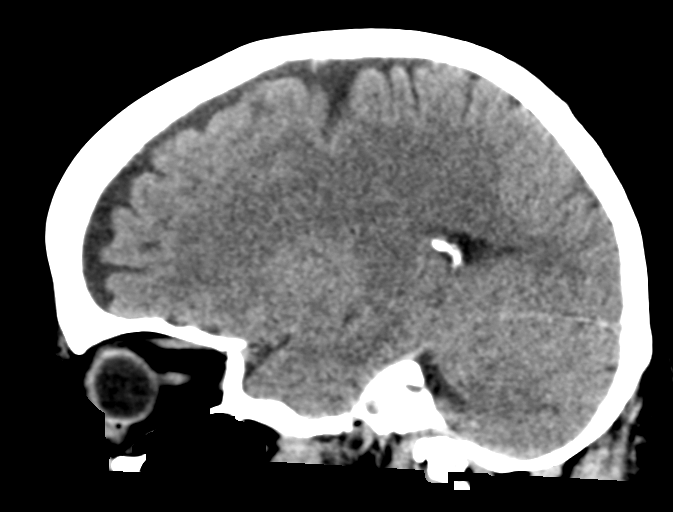

[15 of 47 positions shown; findings below may reference images not displayed]

FINDINGS: Brain: No evidence of acute infarction, hemorrhage, hydrocephalus,
extra-axial collection or mass lesion/mass effect.

Prominence of the sulci suggests mild cortical volume loss. Mild
cerebellar atrophy is noted.

The brainstem and fourth ventricle are within normal limits. The
basal ganglia are unremarkable in appearance. The cerebral
hemispheres demonstrate grossly normal gray-white differentiation.
No mass effect or midline shift is seen.

Vascular: No hyperdense vessel or unexpected calcification.

Skull: There is no evidence of fracture; visualized osseous
structures are unremarkable in appearance.

Sinuses/Orbits: The orbits are within normal limits. The paranasal
sinuses and mastoid air cells are well-aerated.

Other: A soft tissue laceration is noted overlying the left
posterior parietal calvarium.
IMPRESSION: 1. No evidence of traumatic intracranial injury or fracture.
2. Soft tissue laceration overlying the left posterior parietal
calvarium.

## 2018-03-08 IMAGING — CT CT CERVICAL SPINE W/O CM
4 series · 18 of 33 positions shown, 21 images · non-contrast
Comparison: None.

CLINICAL DATA: Acute onset of syncope and fall, hitting head.
Laceration at the back of the head. Dizziness and headache.
Hypotension. Initial encounter.

EXAM:
CT CERVICAL SPINE WITHOUT CONTRAST
TECHNIQUE: Multidetector CT imaging of the cervical spine was performed without
intravenous contrast. Multiplanar CT image reconstructions were also
generated.

[Series 202: soft tissue, idose (2) · axial · 0.30mm/px · z∈[+1036,+1148]mm · 5 of 85 slices shown]
[im 15/85  soft-tissue]
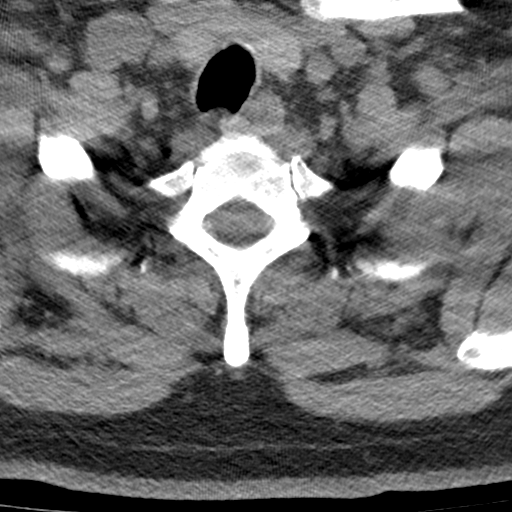
[im 29/85  soft-tissue]
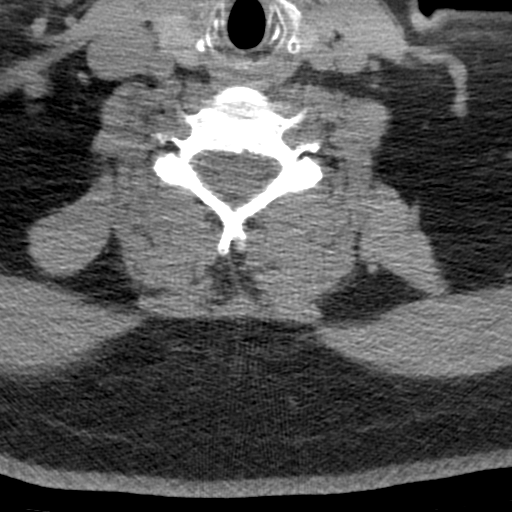
[im 43/85  soft-tissue]
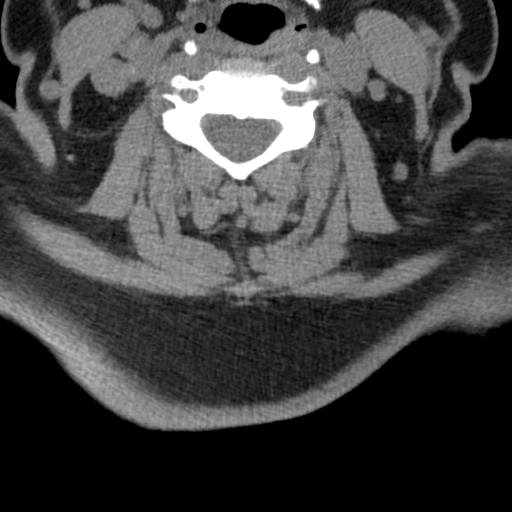
[im 57/85  soft-tissue]
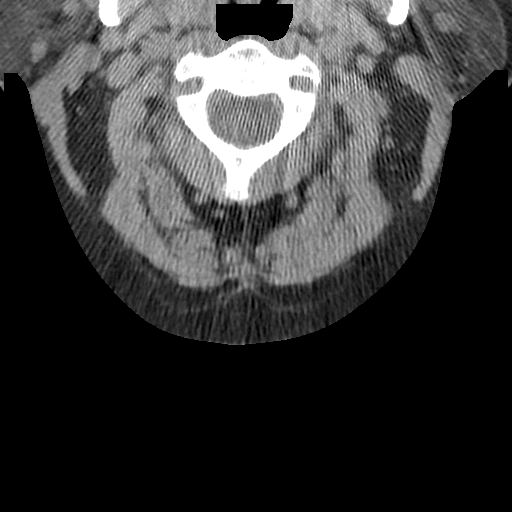
[im 71/85  soft-tissue]
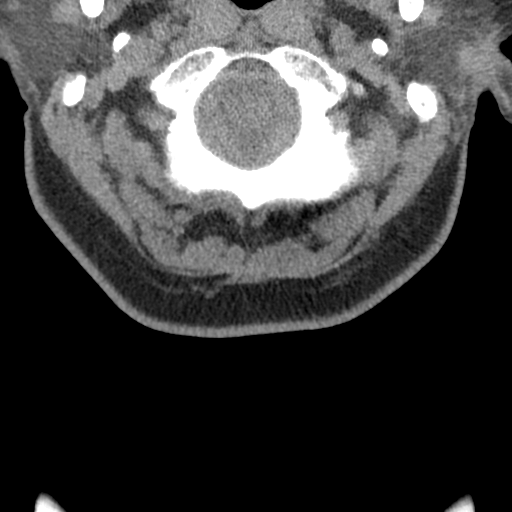

[Series 204: coronal, idose (2) · coronal · 0.33mm/px · 3 of 56 slices shown]
[im 12/56  bone]
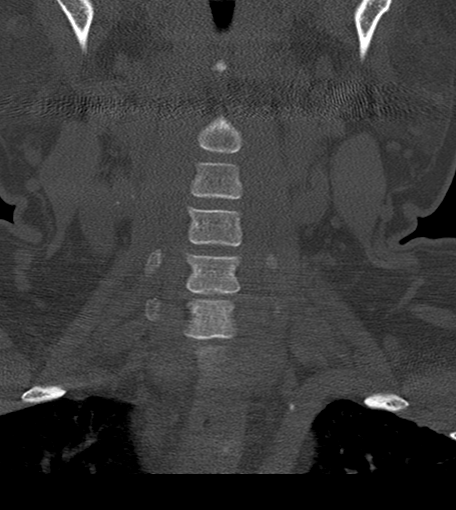
[im 23/56  bone]
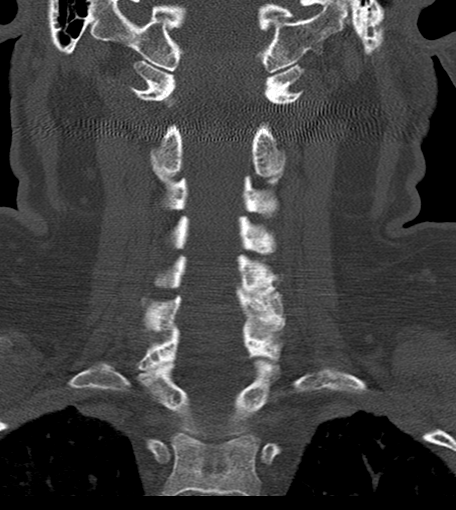
[im 34/56  bone]
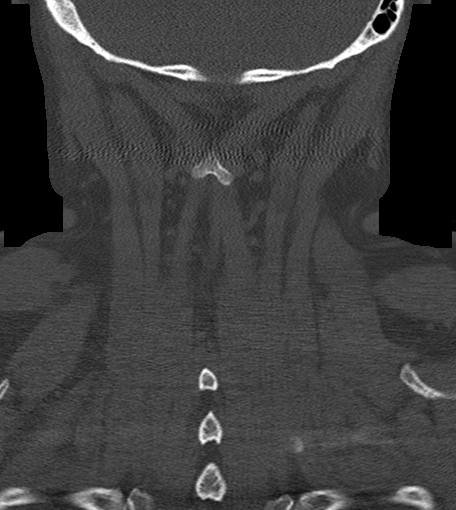

[Series 205: sagittal, idose (2) · sagittal · 0.30mm/px · 5 of 76 slices shown, 6 images]
[im 26/76  bone]
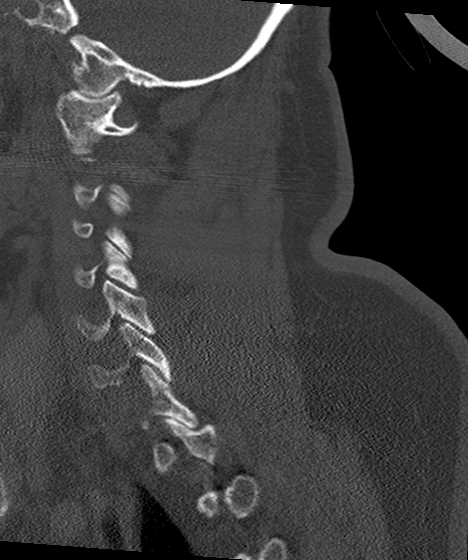
[im 32/76  bone]
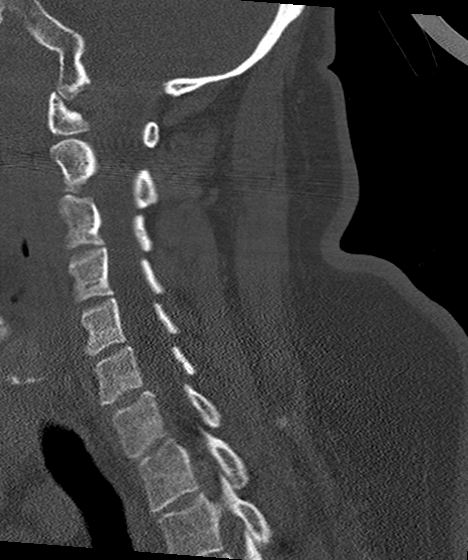
[im 38/76  soft-tissue]
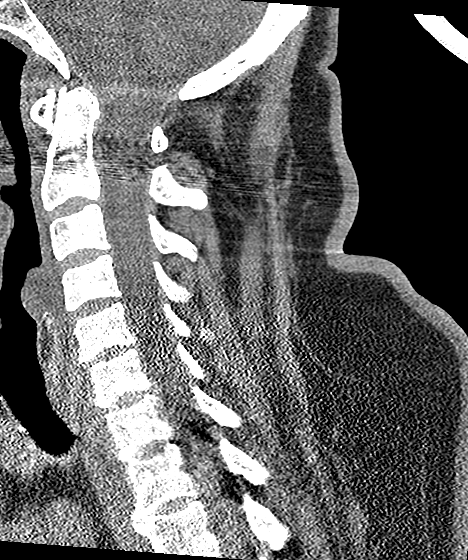
[im 38/76  bone]
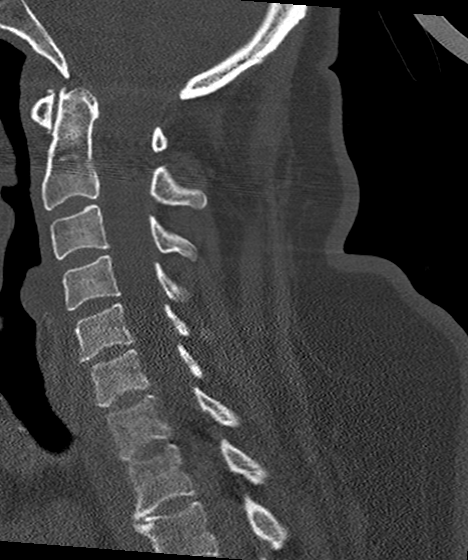
[im 44/76  bone]
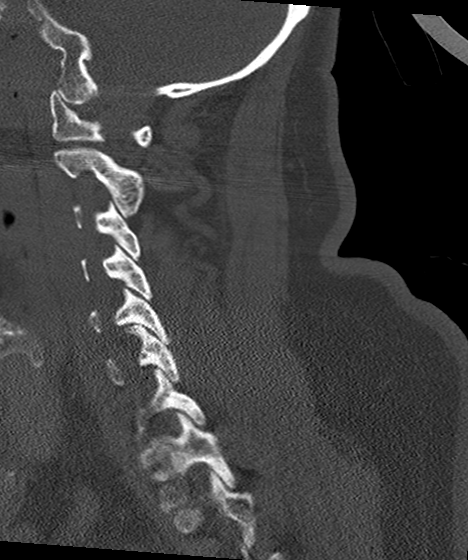
[im 51/76  bone]
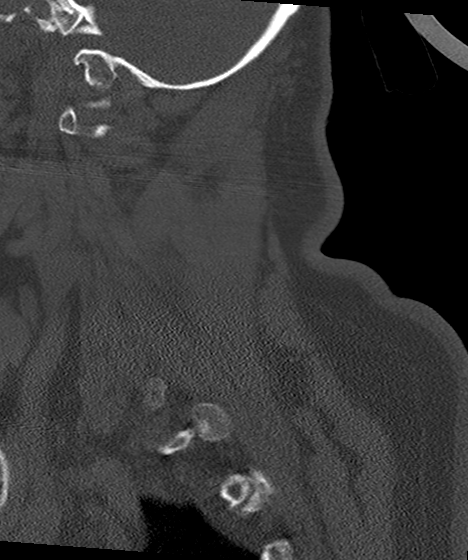

[Series 206: orthogonals, idose (2) · axial · 0.30mm/px · z∈[+1030,+1142]mm · 5 of 85 slices shown, 7 images]
[im 15/85  soft-tissue]
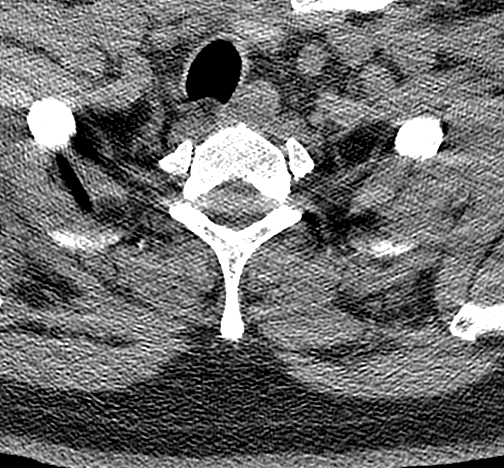
[im 15/85  bone]
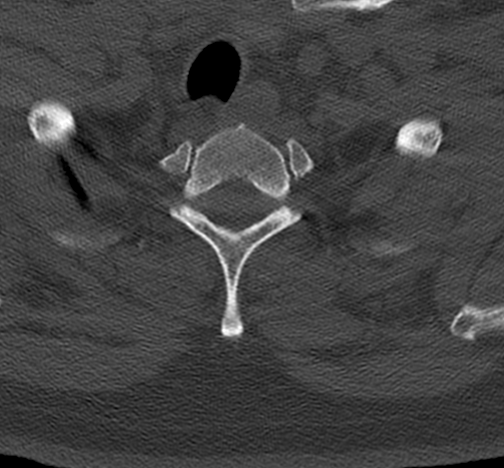
[im 29/85  bone]
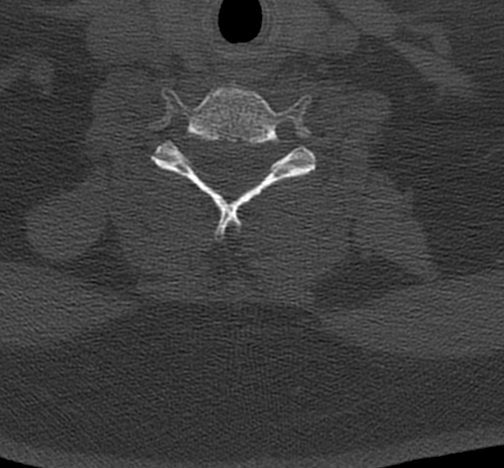
[im 43/85  bone]
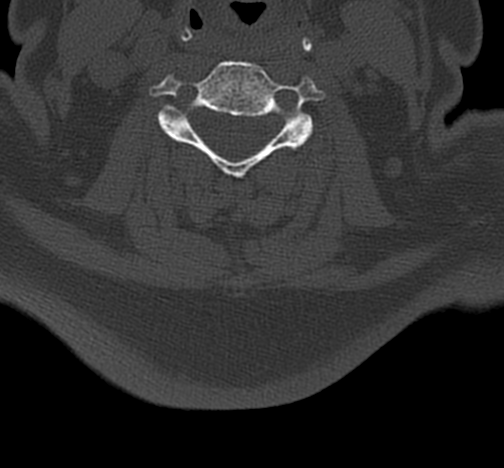
[im 57/85  bone]
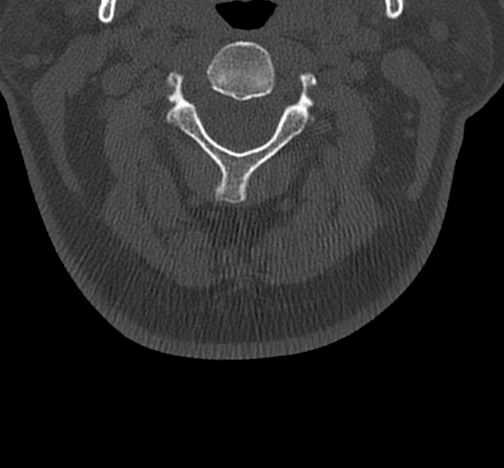
[im 71/85  soft-tissue]
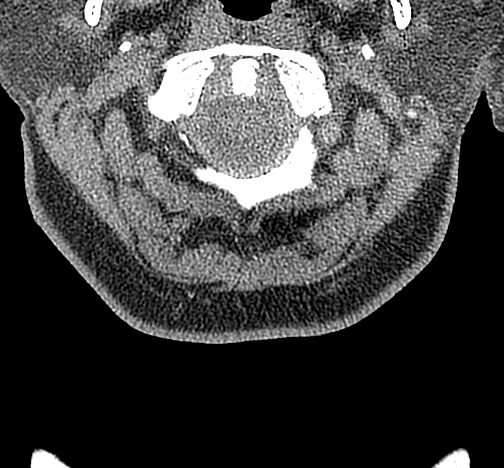
[im 71/85  bone]
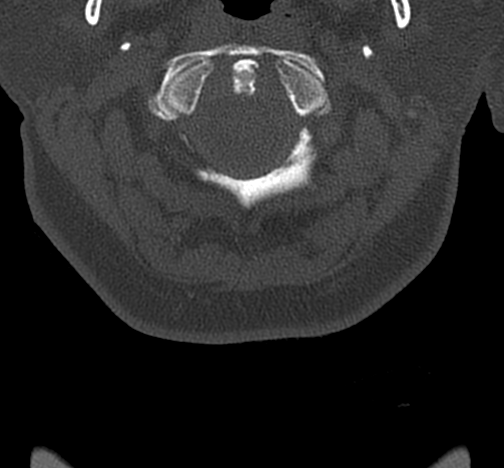

[18 of 33 positions shown; findings below may reference images not displayed]

FINDINGS: Alignment: Normal.

Skull base and vertebrae: No acute fracture. No primary bone lesion
or focal pathologic process.

Soft tissues and spinal canal: No prevertebral fluid or swelling. No
visible canal hematoma.

Disc levels: Intervertebral disc spaces are preserved. The bony
foramina are grossly unremarkable.

Upper chest: Minimal atelectasis is noted at the lung apices. The
thyroid gland is grossly unremarkable.

Other: No additional soft tissue abnormalities are seen. The
visualized portions of the brain are grossly unremarkable.
IMPRESSION: No evidence of fracture or subluxation along the cervical spine.

## 2018-05-03 ENCOUNTER — Emergency Department (HOSPITAL_COMMUNITY): Payer: Self-pay

## 2018-05-03 ENCOUNTER — Encounter (HOSPITAL_COMMUNITY): Payer: Self-pay | Admitting: Emergency Medicine

## 2018-05-03 ENCOUNTER — Emergency Department (HOSPITAL_COMMUNITY)
Admission: EM | Admit: 2018-05-03 | Discharge: 2018-05-03 | Disposition: A | Discharge status: Home / Self Care | Payer: Self-pay | Attending: Emergency Medicine | Admitting: Emergency Medicine

## 2018-05-03 ENCOUNTER — Other Ambulatory Visit: Payer: Self-pay

## 2018-05-03 DIAGNOSIS — Z7984 Long term (current) use of oral hypoglycemic drugs: Secondary | ICD-10-CM | POA: Insufficient documentation

## 2018-05-03 DIAGNOSIS — Z79899 Other long term (current) drug therapy: Secondary | ICD-10-CM | POA: Insufficient documentation

## 2018-05-03 DIAGNOSIS — R519 Headache, unspecified: Secondary | ICD-10-CM

## 2018-05-03 DIAGNOSIS — I10 Essential (primary) hypertension: Secondary | ICD-10-CM | POA: Insufficient documentation

## 2018-05-03 DIAGNOSIS — R51 Headache: Secondary | ICD-10-CM | POA: Insufficient documentation

## 2018-05-03 DIAGNOSIS — E119 Type 2 diabetes mellitus without complications: Secondary | ICD-10-CM | POA: Insufficient documentation

## 2018-05-03 LAB — CBC WITH DIFFERENTIAL/PLATELET
Abs Immature Granulocytes: 0.04 10*3/uL (ref 0.00–0.07)
Basophils Absolute: 0 10*3/uL (ref 0.0–0.1)
Basophils Relative: 0 %
Eosinophils Absolute: 0.3 10*3/uL (ref 0.0–0.5)
Eosinophils Relative: 3 %
HCT: 45.1 % (ref 36.0–46.0)
Hemoglobin: 13.9 g/dL (ref 12.0–15.0)
Immature Granulocytes: 1 %
Lymphocytes Relative: 36 %
Lymphs Abs: 3.1 10*3/uL (ref 0.7–4.0)
MCH: 26.3 pg (ref 26.0–34.0)
MCHC: 30.8 g/dL (ref 30.0–36.0)
MCV: 85.3 fL (ref 80.0–100.0)
Monocytes Absolute: 0.5 10*3/uL (ref 0.1–1.0)
Monocytes Relative: 6 %
Neutro Abs: 4.8 10*3/uL (ref 1.7–7.7)
Neutrophils Relative %: 54 %
Platelets: 271 10*3/uL (ref 150–400)
RBC: 5.29 MIL/uL — ABNORMAL HIGH (ref 3.87–5.11)
RDW: 13.8 % (ref 11.5–15.5)
WBC: 8.8 10*3/uL (ref 4.0–10.5)
nRBC: 0 % (ref 0.0–0.2)

## 2018-05-03 LAB — BASIC METABOLIC PANEL
Anion gap: 13 (ref 5–15)
BUN: 10 mg/dL (ref 6–20)
CO2: 20 mmol/L — ABNORMAL LOW (ref 22–32)
Calcium: 9.2 mg/dL (ref 8.9–10.3)
Chloride: 100 mmol/L (ref 98–111)
Creatinine, Ser: 0.57 mg/dL (ref 0.44–1.00)
GFR calc Af Amer: >60 mL/min (ref >60)
GFR calc non Af Amer: >60 mL/min (ref >60)
Glucose, Bld: 357 mg/dL — ABNORMAL HIGH (ref 70–99)
Potassium: 4.3 mmol/L (ref 3.5–5.1)
Sodium: 133 mmol/L — ABNORMAL LOW (ref 135–145)

## 2018-05-03 MED ORDER — KETOROLAC TROMETHAMINE 15 MG/ML IJ SOLN
15.0000 mg | Freq: Once | INTRAMUSCULAR | Status: AC
  Administered 2018-05-03: 15 mg via INTRAVENOUS
  Filled 2018-05-03: qty 1

## 2018-05-03 MED ORDER — DIPHENHYDRAMINE HCL 50 MG/ML IJ SOLN
12.5000 mg | Freq: Once | INTRAMUSCULAR | Status: AC
  Administered 2018-05-03: 12.5 mg via INTRAVENOUS
  Filled 2018-05-03: qty 1

## 2018-05-03 MED ORDER — METOCLOPRAMIDE HCL 5 MG/ML IJ SOLN
10.0000 mg | Freq: Once | INTRAMUSCULAR | Status: AC
  Administered 2018-05-03: 10 mg via INTRAVENOUS
  Filled 2018-05-03: qty 2

## 2018-05-03 NOTE — ED Notes (Signed)
Ambulated pt in the hallway. Pt walking "gingerly", but with a steady gait. Pt's O2 was 95% and pulse was 98. Informed Dr. Wilson Singer.

## 2018-05-03 NOTE — ED Notes (Signed)
RN informed Pt can receive visitor  

## 2018-05-03 NOTE — ED Triage Notes (Signed)
Pt arrives to ED from home with complaints of a headache that started yesterday and hasn't gone away. Pt reports she has had trouble walking the last couple of days as well. Pt reports that she was taken off of lithium three weeks ago. Pt placed in position of comfort with bed locked and lowered, call bell in reach.

## 2018-05-03 NOTE — ED Notes (Signed)
Pt ambulated with somewhat steady gait to the bathroom.

## 2018-05-03 NOTE — ED Notes (Signed)
Patient transported to MRI 

## 2018-05-03 NOTE — ED Provider Notes (Signed)
[facility] EMERGENCY DEPARTMENT Provider Note   CSN: [id number] Arrival date & time: 05/03/18  0820    History   Chief Complaint Chief Complaint  Patient presents with  . Headache    HPI Jessica Guzman is a 58 y.o. female.  HPI   84yF with numerous complaints. Primarily HA and generalized weakness. HA began yesterday.  She cannot remember she was doing it first began.  Denies any trauma.  Headache is diffuse.  It has been constant.  No appreciable exacerbating living factors.  No acute visual changes.  No jaw claudication.  No nausea or vomiting.  No neck pain or neck stiffness.  No fevers.  She feels generally weak.  Past Medical History:  Diagnosis Date  . Bipolar 1 disorder ([place])   . Diabetes mellitus without complication ([place])   . Hyperlipidemia   . Hypertension   . PTSD (post-traumatic stress disorder)     Patient Active Problem List   Diagnosis Date Noted  . Bipolar disorder, curr episode manic w/o psychotic features, moderate ([place]) 01/21/2017    Past Surgical History:  Procedure Laterality Date  . PCOS    . TUBAL LIGATION       OB History    Gravida  4   Para      Term      Preterm      AB  2   Living  2     SAB  2   TAB      Ectopic      Multiple      Live Births  2            Home Medications    Prior to Admission medications   Medication Sig Start Date End Date Taking? Authorizing Provider  atorvastatin (LIPITOR) 40 MG tablet Take 40 mg by mouth at bedtime. 04/19/18  Yes [provider]  bismuth subsalicylate (PEPTO BISMOL) 262 MG chewable tablet Chew 524 mg daily as needed by mouth.   Yes [provider]  cholecalciferol (VITAMIN D) 1000 UNITS tablet Take 2,000 Units by mouth daily.    Yes [provider]  fluticasone (FLONASE) 50 MCG/ACT nasal spray Place 2 sprays daily into both nostrils.   Yes [provider]  insulin glargine (LANTUS) 100 UNIT/ML injection Inject  18 Units into the skin at bedtime.    Yes [provider]  lisinopril (PRINIVIL,ZESTRIL) 5 MG tablet Take 5 mg by mouth daily. 04/19/18  Yes [provider]  metFORMIN (GLUCOPHAGE) 1000 MG tablet Take 1,000 mg by mouth 2 (two) times daily with a meal.   Yes [provider]  omega-3 acid ethyl esters (LOVAZA) 1 g capsule Take 1 g by mouth daily.   Yes [provider]  PARoxetine (PAXIL) 20 MG tablet Take 20 mg by mouth daily.    Yes [provider]  QUEtiapine (SEROQUEL) 200 MG tablet Take 1 tablet (200 mg total) at bedtime by mouth. 01/22/17  Yes Lord, Asa Saunas, NP  Oxcarbazepine (TRILEPTAL) 300 MG tablet Take 1 tablet (300 mg total) 2 (two) times daily by mouth. Patient not taking: Reported on 02/09/2018 01/22/17   Patrecia Pour, NP    Family History Family History  Problem Relation Age of Onset  . Hypertension Other   . Stroke Maternal Grandfather   . Cancer Paternal Grandfather   . Diabetes Paternal Grandfather     Social History Social History   Tobacco Use  . Smoking status: Never  Smoker  . Smokeless tobacco: Never Used  Substance Use Topics  . Alcohol use: Yes    Comment: rarely  . Drug use: Never     Allergies   Codeine   Review of Systems Review of Systems  All systems reviewed and negative, other than as noted in HPI.  Physical Exam Updated Vital Signs BP 115/65   Pulse 69   Temp 98.5 F (36.9 C) (Oral)   Resp 12   Ht 5\' 2"  (1.575 m)   Wt 81.6 kg   SpO2 94%   BMI 32.92 kg/m   Physical Exam Vitals signs and nursing note reviewed.  Constitutional:      General: She is not in acute distress.    Appearance: She is well-developed.  HENT:     Head: Normocephalic and atraumatic.  Eyes:     General:        Right eye: No discharge.        Left eye: No discharge.     Extraocular Movements: Extraocular movements intact.     Conjunctiva/sclera: Conjunctivae normal.     Pupils: Pupils are equal, round, and  reactive to light.  Neck:     Musculoskeletal: Neck supple.     Comments: No nuchal rigidity.  Cardiovascular:     Rate and Rhythm: Normal rate and regular rhythm.     Heart sounds: Normal heart sounds. No murmur. No friction rub. No gallop.   Pulmonary:     Effort: Pulmonary effort is normal. No respiratory distress.     Breath sounds: Normal breath sounds.  Abdominal:     General: There is no distension.     Palpations: Abdomen is soft.     Tenderness: There is no abdominal tenderness.  Musculoskeletal:        General: No tenderness.  Skin:    General: Skin is warm and dry.  Neurological:     Mental Status: She is alert and oriented to person, place, and time.     Cranial Nerves: No cranial nerve deficit.     Sensory: No sensory deficit.     Motor: No weakness.     Coordination: Coordination normal.     Comments: Exam inconsistent. Pt initially saying she couldn't sit herself up in bed and had to use arms to pull herself up. Then able to get up herself out of bed to standing. Walking w/o assistance. No focal deficits noted. Patellar reflexes present.   Psychiatric:        Behavior: Behavior normal.        Thought Content: Thought content normal.      ED Treatments / Results  Labs (all labs ordered are listed, but only abnormal results are displayed) Labs Reviewed  CBC WITH DIFFERENTIAL/PLATELET - Abnormal; Notable for the following components:      Result Value   RBC 5.29 (*)    All other components within normal limits  BASIC METABOLIC PANEL - Abnormal; Notable for the following components:   Sodium 133 (*)    CO2 20 (*)    Glucose, Bld 357 (*)    All other components within normal limits    EKG EKG Interpretation  Date/Time:  Wednesday May 03 2018 08:28:55 EST Ventricular Rate:  91 PR Interval:    QRS Duration: 90 QT Interval:  354 QTC Calculation: 436 R Axis:   58 Text Interpretation:  Sinus rhythm Nonspecific T abnormalities, lateral leads Baseline  wander in lead(s) II aVR V5 Confirmed by Virgel Manifold [phone])  on 05/03/2018 8:34:15 AM   Radiology Ct Head Wo Contrast  Result Date: 05/03/2018 CLINICAL DATA:  Worst headache of life. EXAM: CT HEAD WITHOUT CONTRAST TECHNIQUE: Contiguous axial images were obtained from the base of the skull through the vertex without intravenous contrast. COMPARISON:  06/16/2016 FINDINGS: Brain: Similar findings of age advanced atrophy with sulcal prominence and prominence of the bifrontal extra-axial spaces. Scattered periventricular hypodensities compatible with microvascular ischemic disease. Gray-white differentiation is otherwise well maintained. No CT evidence of acute large territory infarct. No intraparenchymal or extra-axial mass or hemorrhage. Unchanged size and configuration of the ventricles and the basilar cisterns. No midline shift. Vascular: Intracranial atherosclerosis. Skull: No displaced calvarial fracture. Sinuses/Orbits: Limited visualization of the paranasal sinuses and the mastoid air cells is normal. No air-fluid levels. Other: Regional soft tissues appear normal. IMPRESSION: Similar findings of atrophy and microvascular ischemic disease without acute intracranial process. Electronically Signed   By: Sandi Mariscal M.D.   On: 05/03/2018 11:03   Mr Brain Wo Contrast  Result Date: 05/03/2018 CLINICAL DATA:  Headache and difficulty walking. EXAM: MRI HEAD WITHOUT CONTRAST TECHNIQUE: Multiplanar, multiecho pulse sequences of the brain and surrounding structures were obtained without intravenous contrast. COMPARISON:  05/03/2018 head CT FINDINGS: Brain: There is no evidence of acute infarct, intracranial hemorrhage, mass, midline shift, or extra-axial fluid collection. Mildly to moderately age advanced cerebral atrophy is most notable in the frontal regions. The ventricles are normal in size. The brain is normal in signal. Vascular: Major intracranial vascular flow voids are preserved. Skull and upper  cervical spine: Unremarkable bone marrow signal. Sinuses/Orbits: Unremarkable orbits. Paranasal sinuses and mastoid air cells are clear. Other: None. IMPRESSION: Age advanced cerebral atrophy, otherwise unremarkable brain MRI. Electronically Signed   By: Logan Bores M.D.   On: 05/03/2018 13:39    Procedures Procedures (including critical care time)  Medications Ordered in ED Medications  metoCLOPramide (REGLAN) injection 10 mg (10 mg Intravenous Given 05/03/18 0952)  diphenhydrAMINE (BENADRYL) injection 12.5 mg (12.5 mg Intravenous Given 05/03/18 0951)  ketorolac (TORADOL) 15 MG/ML injection 15 mg (15 mg Intravenous Given 05/03/18 0951)     Initial Impression / Assessment and Plan / ED Course  I have reviewed the triage vital signs and the nursing notes.  Pertinent labs & imaging results that were available during my care of the patient were reviewed by me and considered in my medical decision making (see chart for details).     57yF with numerous complaints but primarily HA and generalized weakness. Lithium stopped 3w ago. I doubt withdrawal symptoms at this point. Neuro exam is nonfocal although she has a lot of various neurological complaints. Imaging including MRI w/o acute abnormality. I doubt meningitis, temporal arteritis, CO poisoning, glaucoma, etc.   Final Clinical Impressions(s) / ED Diagnoses   Final diagnoses:  Nonintractable headache, unspecified chronicity pattern, unspecified headache type    ED Discharge Orders    None       Virgel Manifold, MD 05/10/18 [phone]

## 2018-05-03 NOTE — ED Notes (Signed)
Patient verbalizes understanding of discharge instructions. Opportunity for questioning and answers were provided. Armband removed by staff, pt discharged from ED.  

## 2018-05-14 ENCOUNTER — Other Ambulatory Visit: Payer: Self-pay

## 2018-05-14 ENCOUNTER — Emergency Department (HOSPITAL_COMMUNITY)
Admission: EM | Admit: 2018-05-14 | Discharge: 2018-05-14 | Disposition: A | Discharge status: Home / Self Care | Payer: Medicaid Other | Attending: Emergency Medicine | Admitting: Emergency Medicine

## 2018-05-14 DIAGNOSIS — E119 Type 2 diabetes mellitus without complications: Secondary | ICD-10-CM | POA: Insufficient documentation

## 2018-05-14 DIAGNOSIS — M791 Myalgia, unspecified site: Secondary | ICD-10-CM | POA: Insufficient documentation

## 2018-05-14 DIAGNOSIS — R51 Headache: Secondary | ICD-10-CM | POA: Insufficient documentation

## 2018-05-14 DIAGNOSIS — Z79899 Other long term (current) drug therapy: Secondary | ICD-10-CM | POA: Insufficient documentation

## 2018-05-14 DIAGNOSIS — Z794 Long term (current) use of insulin: Secondary | ICD-10-CM | POA: Insufficient documentation

## 2018-05-14 DIAGNOSIS — I10 Essential (primary) hypertension: Secondary | ICD-10-CM | POA: Insufficient documentation

## 2018-05-14 DIAGNOSIS — R202 Paresthesia of skin: Secondary | ICD-10-CM | POA: Insufficient documentation

## 2018-05-14 DIAGNOSIS — R519 Headache, unspecified: Secondary | ICD-10-CM

## 2018-05-14 DIAGNOSIS — R739 Hyperglycemia, unspecified: Secondary | ICD-10-CM

## 2018-05-14 LAB — BASIC METABOLIC PANEL
Anion gap: 12 (ref 5–15)
BUN: 14 mg/dL (ref 6–20)
CO2: 20 mmol/L — ABNORMAL LOW (ref 22–32)
Calcium: 9.5 mg/dL (ref 8.9–10.3)
Chloride: 101 mmol/L (ref 98–111)
Creatinine, Ser: 0.66 mg/dL (ref 0.44–1.00)
GFR calc Af Amer: >60 mL/min (ref >60)
GFR calc non Af Amer: >60 mL/min (ref >60)
GLUCOSE: 235 mg/dL — AB (ref 70–99)
Potassium: 3.8 mmol/L (ref 3.5–5.1)
Sodium: 133 mmol/L — ABNORMAL LOW (ref 135–145)

## 2018-05-14 LAB — CBC
HCT: 44.5 % (ref 36.0–46.0)
Hemoglobin: 13.9 g/dL (ref 12.0–15.0)
MCH: 26.1 pg (ref 26.0–34.0)
MCHC: 31.2 g/dL (ref 30.0–36.0)
MCV: 83.5 fL (ref 80.0–100.0)
Platelets: 256 10*3/uL (ref 150–400)
RBC: 5.33 MIL/uL — ABNORMAL HIGH (ref 3.87–5.11)
RDW: 13.5 % (ref 11.5–15.5)
WBC: 10.9 10*3/uL — ABNORMAL HIGH (ref 4.0–10.5)
nRBC: 0 % (ref 0.0–0.2)

## 2018-05-14 LAB — URINALYSIS, ROUTINE W REFLEX MICROSCOPIC
Bilirubin Urine: NEGATIVE
GLUCOSE, UA: NEGATIVE mg/dL
Hgb urine dipstick: NEGATIVE
Ketones, ur: 20 mg/dL — AB
Leukocytes,Ua: NEGATIVE
Nitrite: NEGATIVE
Protein, ur: NEGATIVE mg/dL
Specific Gravity, Urine: 1.012 (ref 1.005–1.030)
pH: 6 (ref 5.0–8.0)

## 2018-05-14 LAB — I-STAT BETA HCG BLOOD, ED (MC, WL, AP ONLY): I-stat hCG, quantitative: <5 m[IU]/mL (ref <5)

## 2018-05-14 MED ORDER — DIPHENHYDRAMINE HCL 50 MG/ML IJ SOLN
25.0000 mg | Freq: Once | INTRAMUSCULAR | Status: AC
  Administered 2018-05-14: 25 mg via INTRAVENOUS
  Filled 2018-05-14: qty 1

## 2018-05-14 MED ORDER — SODIUM CHLORIDE 0.9 % IV BOLUS
1000.0000 mL | Freq: Once | INTRAVENOUS | Status: AC
  Administered 2018-05-14: 1000 mL via INTRAVENOUS

## 2018-05-14 MED ORDER — PROCHLORPERAZINE EDISYLATE 10 MG/2ML IJ SOLN
10.0000 mg | Freq: Once | INTRAMUSCULAR | Status: AC
  Administered 2018-05-14: 10 mg via INTRAVENOUS
  Filled 2018-05-14: qty 2

## 2018-05-14 MED ORDER — PROCHLORPERAZINE MALEATE 10 MG PO TABS: 10.0000 mg | ORAL_TABLET | Freq: Two times a day (BID) | ORAL | 0 refills | Status: DC | PRN

## 2018-05-14 NOTE — ED Triage Notes (Signed)
Pt states that she had been having headache for the past several weeks, seen on 2/26 for the same, followed up with PCP, pt also has multiple other complaints of feeling like there is something in her R ear, pain all over her body, heaviness all over. Neuro intact bilaterally. Pt c/o of dizziness and nausea at times, back pain, abd pain, hyperglycemia.

## 2018-05-14 NOTE — ED Notes (Signed)
Pt ambulated to room from waiting room, tolerated well. 

## 2018-05-14 NOTE — ED Provider Notes (Signed)
[facility] EMERGENCY DEPARTMENT Provider Note   CSN: [id number] Arrival date & time: 05/14/18  0110    History   Chief Complaint Chief Complaint  Patient presents with  . Headache    HPI Jessica Guzman is a 58 y.o. female.     HPI 58 year old female here with multiple complaints.  Patient has a history of bipolar disorder, diabetes, hypertension, and PTSD.  Patient's primary complaint is persistent, usually unilateral headaches involving both sides along with tingling sensations in her hands and throughout her back.  She reports she feels a flushing and warm sensation that shoots up her back, to her head, associated with headache.  She is also had increasing anxiety, nausea, right ear pain, as well as bilateral hip pain for the last 3 weeks.  She has been seen multiple times by her PCP as well as in this emergency department for this, with negative MRI.  The symptoms do seem to correlate with decreasing her lithium.  She is not currently on any other mood stabilizer.  However, she denies any mania or difficulty sleeping.  No SI, HI, or hallucinations.  She reports that she has been referred to multiple specialist and expresses frustration that she cannot get a diagnosis.  She admits that her symptoms today are primarily her headache, which is constant but she has had minimal relief with naproxen at home.  No focal numbness or weakness that persists.  No fevers or chills.  No neck pain or neck stiffness.  No other complaints.  No other recent medication change.   Past Medical History:  Diagnosis Date  . Bipolar 1 disorder ([place])   . Diabetes mellitus without complication ([place])   . Hyperlipidemia   . Hypertension   . PTSD (post-traumatic stress disorder)     Patient Active Problem List   Diagnosis Date Noted  . Bipolar disorder, curr episode manic w/o psychotic features, moderate ([place]) 01/21/2017    Past Surgical History:  Procedure Laterality Date  . PCOS     . TUBAL LIGATION       OB History    Gravida  4   Para      Term      Preterm      AB  2   Living  2     SAB  2   TAB      Ectopic      Multiple      Live Births  2            Home Medications    Prior to Admission medications   Medication Sig Start Date End Date Taking? Authorizing Provider  atorvastatin (LIPITOR) 40 MG tablet Take 40 mg by mouth at bedtime. 04/19/18   [provider]  bismuth subsalicylate (PEPTO BISMOL) 262 MG chewable tablet Chew 524 mg daily as needed by mouth.    [provider]  cholecalciferol (VITAMIN D) 1000 UNITS tablet Take 2,000 Units by mouth daily.     [provider]  fluticasone (FLONASE) 50 MCG/ACT nasal spray Place 2 sprays daily into both nostrils.    [provider]  insulin glargine (LANTUS) 100 UNIT/ML injection Inject 18 Units into the skin at bedtime.     [provider]  lisinopril (PRINIVIL,ZESTRIL) 5 MG tablet Take 5 mg by mouth daily. 04/19/18   [provider]  metFORMIN (GLUCOPHAGE) 1000 MG tablet Take 1,000 mg by mouth 2 (two) times daily with a meal.    [provider]  omega-3 acid ethyl esters (LOVAZA) 1 g capsule Take 1 g by mouth daily.    [provider]  Oxcarbazepine (TRILEPTAL) 300 MG tablet Take 1 tablet (300 mg total) 2 (two) times daily by mouth. Patient not taking: Reported on 02/09/2018 01/22/17   Patrecia Pour, NP  PARoxetine (PAXIL) 20 MG tablet Take 20 mg by mouth daily.     [provider]  prochlorperazine (COMPAZINE) 10 MG tablet Take 1 tablet (10 mg total) by mouth 2 (two) times daily as needed for nausea (or headache). 05/14/18   Duffy Bruce, MD  QUEtiapine (SEROQUEL) 200 MG tablet Take 1 tablet (200 mg total) at bedtime by mouth. 01/22/17   Patrecia Pour, NP    Family History Family History  Problem Relation Age of Onset  . Hypertension Other   . Stroke Maternal Grandfather   . Cancer Paternal  Grandfather   . Diabetes Paternal Grandfather     Social History Social History   Tobacco Use  . Smoking status: Never Smoker  . Smokeless tobacco: Never Used  Substance Use Topics  . Alcohol use: Yes    Comment: rarely  . Drug use: Never     Allergies   Codeine   Review of Systems Review of Systems  Constitutional: Positive for chills and fatigue. Negative for fever.  HENT: Positive for ear pain and sore throat. Negative for congestion and rhinorrhea.   Eyes: Negative for visual disturbance.  Respiratory: Positive for cough. Negative for shortness of breath and wheezing.   Cardiovascular: Negative for chest pain and leg swelling.  Gastrointestinal: Positive for nausea. Negative for abdominal pain, diarrhea and vomiting.  Endocrine: Positive for polyuria.  Genitourinary: Negative for dysuria, flank pain, vaginal bleeding and vaginal discharge.  Musculoskeletal: Positive for arthralgias and myalgias. Negative for neck pain.  Skin: Negative for rash.  Allergic/Immunologic: Negative for immunocompromised state.  Neurological: Positive for weakness, light-headedness and numbness. Negative for syncope and headaches.  Hematological: Does not bruise/bleed easily.  All other systems reviewed and are negative.    Physical Exam Updated Vital Signs BP 129/72 (BP Location: Right Arm)   Pulse 77   Temp 97.8 F (36.6 C) (Oral)   Resp 17   SpO2 93%   Physical Exam Vitals signs and nursing note reviewed.  Constitutional:      General: She is not in acute distress.    Appearance: She is well-developed.  HENT:     Head: Normocephalic and atraumatic.     Comments: Minimal serous effusion bilaterally without any tympanic membrane opacification.  No erythema.Mildly dry mucous membranes.  Posterior pharynx clear. Eyes:     Conjunctiva/sclera: Conjunctivae normal.  Neck:     Musculoskeletal: Neck supple.  Cardiovascular:     Rate and Rhythm: Normal rate and regular rhythm.      Heart sounds: Normal heart sounds. No murmur. No friction rub.  Pulmonary:     Effort: Pulmonary effort is normal. No respiratory distress.     Breath sounds: Normal breath sounds. No wheezing or rales.  Abdominal:     General: There is no distension.     Palpations: Abdomen is soft.     Tenderness: There is no abdominal tenderness.  Skin:    General: Skin is warm.     Capillary Refill: Capillary refill takes less than 2 seconds.  Neurological:     Mental Status: She is alert and oriented to person, place, and time.     Motor: No abnormal  muscle tone.  Psychiatric:     Comments: Anxious, tearful.  No pressured speech.  Denies any SI, HI, or auditory visualizations.     Neurological Exam:  Mental Status: Alert and oriented to person, place, and time. Attention and concentration normal. Speech clear. Recent memory is intact. Cranial Nerves: Visual fields grossly intact. EOMI and PERRLA. No nystagmus noted. Facial sensation intact at forehead, maxillary cheek, and chin/mandible bilaterally. No facial asymmetry or weakness. Hearing grossly normal. Uvula is midline, and palate elevates symmetrically. Normal SCM and trapezius strength. Tongue midline without fasciculations. Motor: Muscle strength 5/5 in proximal and distal UE and LE bilaterally. No pronator drift. Muscle tone normal. Reflexes: 2+ and symmetrical in all four extremities.  Sensation: Intact to light touch in upper and lower extremities distally bilaterally.  Gait: Normal without ataxia. Coordination: Normal FTN bilaterally.     ED Treatments / Results  Labs (all labs ordered are listed, but only abnormal results are displayed) Labs Reviewed  BASIC METABOLIC PANEL - Abnormal; Notable for the following components:      Result Value   Sodium 133 (*)    CO2 20 (*)    Glucose, Bld 235 (*)    All other components within normal limits  CBC - Abnormal; Notable for the following components:   WBC 10.9 (*)    RBC 5.33 (*)      All other components within normal limits  URINALYSIS, ROUTINE W REFLEX MICROSCOPIC - Abnormal; Notable for the following components:   Color, Urine STRAW (*)    Ketones, ur 20 (*)    All other components within normal limits  CBG MONITORING, ED  I-STAT BETA HCG BLOOD, ED ([facility], [facility], [facility] ONLY)    EKG EKG Interpretation  Date/Time:  Sunday May 14 2018 01:35:57 EST Ventricular Rate:  117 PR Interval:  140 QRS Duration: 84 QT Interval:  328 QTC Calculation: 457 R Axis:   53 Text Interpretation:  Sinus tachycardia Otherwise normal ECG When compared with ECG of 05/03/2018, No significant change was found Confirmed by Delora Fuel ([id number]) on 05/14/2018 1:49:11 AM   Radiology No results found.  Procedures Procedures (including critical care time)  Medications Ordered in ED Medications  sodium chloride 0.9 % bolus 1,000 mL (1,000 mLs Intravenous New Bag/Given 05/14/18 0927)  prochlorperazine (COMPAZINE) injection 10 mg (10 mg Intravenous Given 05/14/18 0923)  diphenhydrAMINE (BENADRYL) injection 25 mg (25 mg Intravenous Given 05/14/18 9622)     Initial Impression / Assessment and Plan / ED Course  I have reviewed the triage vital signs and the nursing notes.  Pertinent labs & imaging results that were available during my care of the patient were reviewed by me and considered in my medical decision making (see chart for details).  Clinical Course as of May 13 1053  Sun May 14, 2018  0931 58 yo F here with multiple chronic complaints. Major complaint is HA - she has been seen multiple times for this including MRI 2 weeks ago which was neg. No focal neuro deficits on my exam. No fever or infectious sx. Suspect this could be complex/atypical migraine, versus tension-type HA. I suspect there is also a component of increased stressors and possible an underlying pain d/o such as fibromyalgia. This also correlates with discontinuation of her lithium, and she had previously been on trileptal -  there could be a component of mild med withdrawal, versus underlying paresthesias/neuropathy form her diabetes which is now more evident. Her labs are overall very reassuring. She has  some mild hyperglycemia and dehydration - IVF given. Will tx for migraine/primary HA syndrome, and re-assess.   [CI]  1007 Pt feeling much better. VSS. Suspect that some her headache, tingling pains are paresthesias 2/2 her worsening diabetes, possible med changes, with additional component of possible underlying fibromyalgia. Based lin reassuring vitals, labs, and stability in ED after >9 hour wait and monitoring in room, no apparent emergent etiology identified. I do think it's reasonable to offer a short course of compazine PRN headache as she has responded well to this, in addition to her NSAIDs at home. Will have her f/u with  Neurologist and discuss meds w/ Psychiatrist.   [CI]    Clinical Course User Index [CI] Duffy Bruce, MD        Final Clinical Impressions(s) / ED Diagnoses   Final diagnoses:  Bad headache  Paresthesias  Myalgia  Hyperglycemia    ED Discharge Orders         Ordered    prochlorperazine (COMPAZINE) 10 MG tablet  2 times daily PRN     05/14/18 1013           Duffy Bruce, MD 05/14/18 1056

## 2018-05-14 NOTE — Discharge Instructions (Addendum)
I would recommend calling your primary doctor this week to discuss your Er visit.  I think your pain, headaches, and tingling feelings could be related to your diabetes. You may benefit from starting a medication called GABAPENTIN, but this should be started with monitoring by your primary.  You were also dehydrated - make sure you drink plenty of fluids, at least 8 glasses of water daily.  The compazine medication can be taken for headache as well as nausea.

## 2018-05-14 NOTE — ED Notes (Signed)
Patient verbalizes understanding of discharge instructions. Opportunity for questioning and answers were provided. Armband removed by staff, pt discharged from ED.  

## 2019-01-01 ENCOUNTER — Other Ambulatory Visit (HOSPITAL_COMMUNITY)
Admission: RE | Admit: 2019-01-01 | Payer: Medicaid Other | Source: Ambulatory Visit | Admitting: Obstetrics and Gynecology

## 2019-01-01 ENCOUNTER — Other Ambulatory Visit: Payer: Self-pay | Admitting: *Deleted

## 2019-01-01 ENCOUNTER — Other Ambulatory Visit: Payer: Self-pay

## 2019-01-01 DIAGNOSIS — Z124 Encounter for screening for malignant neoplasm of cervix: Secondary | ICD-10-CM

## 2019-01-01 DIAGNOSIS — Z1231 Encounter for screening mammogram for malignant neoplasm of breast: Secondary | ICD-10-CM

## 2019-01-01 NOTE — Progress Notes (Signed)
Patient: Jessica Guzman           Date of Birth: [birth year]           MRN: [id number] Visit Date: 01/01/2019 PCP: Elbert Ewings, FNP  Temp: 97.3 Temporal  Cervical Cancer Screening Do you smoke?: Yes Have you ever had or been told you have an allergy to latex products?: No Marital status: Divorce Date of last pap smear: 1-2 yrs ago Date of last menstrual period: (Post Menopausal) Number of pregnancies: 4 Number of births: 2 Have you ever had any of the following? Hysterectomy: No Tubal ligation (tubes tied): Yes Abnormal bleeding: Yes Abnormal pap smear: Yes Venereal warts: No A sex partner with venereal warts: No A high risk* sex partner: Yes  Cervical Exam Normal Exam. Patient has a history of two abnormal Pap smears in 2017 that a LEEP was completed for follow-up and 12/19/2017 that was ASCUS with negative HPV that no follow-up was completed. Patient referred to [facility] for screening mammogram and free lung cancer screening due to history of smoking over 30 years.   Patient's History Patient Active Problem List   Diagnosis Date Noted  . Bipolar disorder, curr episode manic w/o psychotic features, moderate ([place]) 01/21/2017   Past Medical History:  Diagnosis Date  . Bipolar 1 disorder ([place])   . Diabetes mellitus without complication ([place])   . Hyperlipidemia   . Hypertension   . PTSD (post-traumatic stress disorder)     Family History  Problem Relation Age of Onset  . Hypertension Other   . Stroke Maternal Grandfather   . Cancer Paternal Grandfather   . Diabetes Paternal Grandfather     Social History   Occupational History  . Not on file  Tobacco Use  . Smoking status: Never Smoker  . Smokeless tobacco: Never Used  Substance and Sexual Activity  . Alcohol use: Yes    Comment: rarely  . Drug use: Never  . Sexual activity: Not Currently

## 2019-01-03 LAB — CYTOLOGY - PAP: Diagnosis: NEGATIVE

## 2019-02-15 ENCOUNTER — Encounter (HOSPITAL_COMMUNITY): Payer: Self-pay

## 2019-02-15 ENCOUNTER — Ambulatory Visit (HOSPITAL_COMMUNITY)
Admission: RE | Admit: 2019-02-15 | Discharge: 2019-02-15 | Disposition: A | Discharge status: Home / Self Care | Payer: Medicaid Other | Source: Ambulatory Visit | Attending: Obstetrics and Gynecology | Admitting: Obstetrics and Gynecology

## 2019-02-15 ENCOUNTER — Ambulatory Visit
Admission: RE | Admit: 2019-02-15 | Discharge: 2019-02-15 | Disposition: A | Discharge status: Home / Self Care | Payer: Medicaid Other | Source: Ambulatory Visit | Attending: Obstetrics and Gynecology | Admitting: Obstetrics and Gynecology

## 2019-02-15 ENCOUNTER — Other Ambulatory Visit: Payer: Self-pay

## 2019-02-15 DIAGNOSIS — Z1239 Encounter for other screening for malignant neoplasm of breast: Secondary | ICD-10-CM | POA: Insufficient documentation

## 2019-02-15 DIAGNOSIS — Z1231 Encounter for screening mammogram for malignant neoplasm of breast: Secondary | ICD-10-CM

## 2019-02-15 NOTE — Patient Instructions (Signed)
Explained breast cancer awareness with Jessica Guzman. Patient did not need a Pap smear today due to last Pap smear was 01/01/2019. Let patient know that her next Pap smear is due in one year due to her history of an abnormal Pap smear 12/19/2017 and only having one normal Pap smear since. Referred patient to the [facility] for a screening mammogram. Appointment scheduled for Thursday, February 15, 2019 at 1250. Patient aware of appointment and will be there. Let [facility] know [facility] will [facility] up with her within the next couple weeks with results of mammogram by letter or phone. Shaunte Deliman [place] verbalized understanding.  Taym Twist, Arvil Chaco, RN 10:13 AM

## 2019-02-15 NOTE — Progress Notes (Signed)
No complaints today.   Pap Smear: Pap smear not completed today. Last Pap smear was 01/01/2019 at the free cervical cancer screening sponsored by the [facility] and normal. Patient has a history of an abnormal Pap smear on 12/19/2017 at the free cervical cancer screening at the [facility] that was ASCUS with negative HPV. Per patient has a history of one other abnormal Pap smear in 2017 that a LEEP was completed for follow-up. Last two Pap smear results are in Epic.  Physical exam: Breasts Right breast slightly larger than left that per patient is normal for her. No skin abnormalities bilateral breasts. No nipple retraction bilateral breasts. No nipple discharge bilateral breasts. No lymphadenopathy. No lumps palpated bilateral breasts. No complaints of pain or tenderness on exam. Referred patient to the [facility] for a screening mammogram. Appointment scheduled for Thursday, February 15, 2019 at 1250.        Pelvic/Bimanual No Pap smear completed today since last Pap smear was 01/01/2019. Pap smear not indicated per BCCCP guidelines.   Smoking History: Patient has never smoked.  Patient Navigation: Patient education provided. Access to services provided for patient through [facility] program.   Colorectal Cancer Screening: Per patient had a colonoscopy completed in 2009. No complaints today.   Breast and Cervical Cancer Risk Assessment: Patient has no family history of breast cancer, known genetic mutations, or radiation treatment to the chest before age 20. Per patient has a history of cervical dysplasia. Patient has no history of being immunocompromised or DES exposure in-utero.  Risk Assessment    Risk Scores      02/15/2019 02/09/2018   Last edited by: Loletta Parish, RN Jessica Guzman, Heath Gold, RN   5-year risk: 1.1 % 1.1 %   Lifetime risk: 7.2 % 7.4 %

## 2019-03-19 ENCOUNTER — Ambulatory Visit: Payer: Medicaid Other | Admitting: Acute Care

## 2019-10-15 ENCOUNTER — Other Ambulatory Visit: Payer: Self-pay

## 2019-10-15 ENCOUNTER — Ambulatory Visit: Payer: Self-pay | Attending: Family Medicine | Admitting: Podiatry

## 2019-10-15 DIAGNOSIS — M205X9 Other deformities of toe(s) (acquired), unspecified foot: Secondary | ICD-10-CM

## 2019-10-15 DIAGNOSIS — E119 Type 2 diabetes mellitus without complications: Secondary | ICD-10-CM

## 2019-10-22 NOTE — Progress Notes (Signed)
Subjective:   Patient ID: Jessica Guzman, female   DOB: 59 y.o.   MRN: [id number]   H[facility]  59 year old female presents the office today for diabetic foot patient.  She has no issues with her feet she has no concerns.  Denies a history of ulcerations.  She states that she has never had a foot exam.  She has no other concerns today.  Review of Systems  All other systems reviewed and are negative.  Past Medical History:  Diagnosis Date  . Bipolar 1 disorder ([place])   . Diabetes mellitus without complication ([place])   . Hyperlipidemia   . Hypertension   . PTSD (post-traumatic stress disorder)     Past Surgical History:  Procedure Laterality Date  . PCOS    . TUBAL LIGATION       Current Outpatient Medications:  .  atorvastatin (LIPITOR) 40 MG tablet, Take 40 mg by mouth at bedtime., Disp: , Rfl:  .  bismuth subsalicylate (PEPTO BISMOL) 262 MG chewable tablet, Chew 524 mg daily as needed by mouth., Disp: , Rfl:  .  cholecalciferol (VITAMIN D) 1000 UNITS tablet, Take 2,000 Units by mouth daily. , Disp: , Rfl:  .  fluticasone (FLONASE) 50 MCG/ACT nasal spray, Place 2 sprays daily into both nostrils., Disp: , Rfl:  .  insulin glargine (LANTUS) 100 UNIT/ML injection, Inject 18 Units into the skin at bedtime. , Disp: , Rfl:  .  lisinopril (PRINIVIL,ZESTRIL) 5 MG tablet, Take 5 mg by mouth daily., Disp: , Rfl:  .  metFORMIN (GLUCOPHAGE) 1000 MG tablet, Take 1,000 mg by mouth 2 (two) times daily with a meal., Disp: , Rfl:  .  omega-3 acid ethyl esters (LOVAZA) 1 g capsule, Take 1 g by mouth daily., Disp: , Rfl:  .  Oxcarbazepine (TRILEPTAL) 300 MG tablet, Take 1 tablet (300 mg total) 2 (two) times daily by mouth. (Patient not taking: Reported on 02/09/2018), Disp: 60 tablet, Rfl: 0 .  PARoxetine (PAXIL) 20 MG tablet, Take 20 mg by mouth daily. , Disp: , Rfl:  .  prochlorperazine (COMPAZINE) 10 MG tablet, Take 1 tablet (10 mg total) by mouth  2 (two) times daily as needed for nausea (or headache)., Disp: 14 tablet, Rfl: 0 .  QUEtiapine (SEROQUEL) 200 MG tablet, Take 1 tablet (200 mg total) at bedtime by mouth., Disp: 30 tablet, Rfl: 0  Allergies  Allergen Reactions  . Codeine Nausea And Vomiting        Objective:  Physical Exam  General: AAO x3, NAD  Dermatological: No ulcerations identified.  There is slight erythema on the area of bone spurs to both of her big toe joints but otherwise there is no skin breakdown.  There is no areas of fluctuation.  There is no callus at this time.  Vascular: Dorsalis Pedis artery and Posterior Tibial artery pedal pulses are 2/4 bilateral with immedate capillary fill time.  There is no pain with calf compression, swelling, warmth, erythema.   Neruologic: Grossly intact via light touch bilateral.  Sensation intact with Semmes Weinstein monofilament  Musculoskeletal: Bunion deformities present bilaterally with hallux limitus of the first MPJ.  There is no tenderness palpation of this area.  Is no other areas of discomfort identified at this time.  Muscular strength 5/5 in all groups tested bilateral.  Gait: Unassisted, Nonantalgic.       Assessment:   59 year old female presents for diabetic foot exam    Plan:  -Treatment  options discussed including all alternatives, risks, and complications -Etiology of symptoms were discussed -At this point she is doing well.  Discussed importance of daily foot inspection as well as wearing shoes to help offload the big toe joints. -Recommend follow-up in 1 year.  Trula Slade DPM

## 2019-11-28 LAB — HM COLONOSCOPY

## 2020-01-02 ENCOUNTER — Telehealth: Payer: Self-pay

## 2020-01-02 NOTE — Telephone Encounter (Signed)
Left message for patient about scheduling BCCCP appointment for mammo and pap. Left name and number for patient to call back.

## 2020-01-25 ENCOUNTER — Other Ambulatory Visit: Payer: No Typology Code available for payment source

## 2020-01-25 DIAGNOSIS — Z20822 Contact with and (suspected) exposure to covid-19: Secondary | ICD-10-CM

## 2020-01-27 LAB — NOVEL CORONAVIRUS, NAA: SARS-CoV-2, NAA: NOT DETECTED

## 2020-01-27 LAB — SARS-COV-2, NAA 2 DAY TAT

## 2020-03-03 ENCOUNTER — Other Ambulatory Visit: Payer: No Typology Code available for payment source

## 2020-03-03 DIAGNOSIS — Z20822 Contact with and (suspected) exposure to covid-19: Secondary | ICD-10-CM

## 2020-03-04 LAB — SARS-COV-2, NAA 2 DAY TAT

## 2020-03-04 LAB — NOVEL CORONAVIRUS, NAA: SARS-CoV-2, NAA: NOT DETECTED

## 2020-03-09 ENCOUNTER — Encounter (HOSPITAL_COMMUNITY): Payer: Self-pay | Admitting: Emergency Medicine

## 2020-03-09 ENCOUNTER — Emergency Department (HOSPITAL_COMMUNITY)
Admission: EM | Admit: 2020-03-09 | Discharge: 2020-03-10 | Disposition: A | Discharge status: Home / Self Care | Payer: Self-pay | Attending: Emergency Medicine | Admitting: Emergency Medicine

## 2020-03-09 ENCOUNTER — Other Ambulatory Visit: Payer: Self-pay

## 2020-03-09 ENCOUNTER — Emergency Department (HOSPITAL_COMMUNITY): Payer: Self-pay

## 2020-03-09 DIAGNOSIS — E785 Hyperlipidemia, unspecified: Secondary | ICD-10-CM | POA: Insufficient documentation

## 2020-03-09 DIAGNOSIS — Z7984 Long term (current) use of oral hypoglycemic drugs: Secondary | ICD-10-CM | POA: Insufficient documentation

## 2020-03-09 DIAGNOSIS — E1169 Type 2 diabetes mellitus with other specified complication: Secondary | ICD-10-CM | POA: Insufficient documentation

## 2020-03-09 DIAGNOSIS — Z794 Long term (current) use of insulin: Secondary | ICD-10-CM | POA: Insufficient documentation

## 2020-03-09 DIAGNOSIS — M5412 Radiculopathy, cervical region: Secondary | ICD-10-CM | POA: Insufficient documentation

## 2020-03-09 DIAGNOSIS — Z79899 Other long term (current) drug therapy: Secondary | ICD-10-CM | POA: Insufficient documentation

## 2020-03-09 DIAGNOSIS — Z87891 Personal history of nicotine dependence: Secondary | ICD-10-CM | POA: Insufficient documentation

## 2020-03-09 DIAGNOSIS — I10 Essential (primary) hypertension: Secondary | ICD-10-CM | POA: Insufficient documentation

## 2020-03-09 DIAGNOSIS — R42 Dizziness and giddiness: Secondary | ICD-10-CM | POA: Insufficient documentation

## 2020-03-09 DIAGNOSIS — R946 Abnormal results of thyroid function studies: Secondary | ICD-10-CM | POA: Insufficient documentation

## 2020-03-09 DIAGNOSIS — R7989 Other specified abnormal findings of blood chemistry: Secondary | ICD-10-CM

## 2020-03-09 LAB — CBG MONITORING, ED: Glucose-Capillary: 116 mg/dL — ABNORMAL HIGH (ref 70–99)

## 2020-03-09 LAB — CBC
HCT: 41.2 % (ref 36.0–46.0)
Hemoglobin: 13.8 g/dL (ref 12.0–15.0)
MCH: 27.4 pg (ref 26.0–34.0)
MCHC: 33.5 g/dL (ref 30.0–36.0)
MCV: 81.7 fL (ref 80.0–100.0)
Platelets: 329 10*3/uL (ref 150–400)
RBC: 5.04 MIL/uL (ref 3.87–5.11)
RDW: 14.2 % (ref 11.5–15.5)
WBC: 13.9 10*3/uL — ABNORMAL HIGH (ref 4.0–10.5)
nRBC: 0 % (ref 0.0–0.2)

## 2020-03-09 LAB — URINALYSIS, ROUTINE W REFLEX MICROSCOPIC
Bilirubin Urine: NEGATIVE
Glucose, UA: NEGATIVE mg/dL
Hgb urine dipstick: NEGATIVE
Ketones, ur: NEGATIVE mg/dL
Leukocytes,Ua: 108 — AB
Nitrite: NEGATIVE
Protein, ur: NEGATIVE mg/dL
Specific Gravity, Urine: 1.005 (ref 1.005–1.030)
pH: 7 (ref 5.0–8.0)

## 2020-03-09 LAB — PROTIME-INR
INR: 1 (ref 0.8–1.2)
Prothrombin Time: 12.3 seconds (ref 11.4–15.2)

## 2020-03-09 LAB — COMPREHENSIVE METABOLIC PANEL
ALT: 22 U/L (ref 0–44)
AST: 21 U/L (ref 15–41)
Albumin: 4.7 g/dL (ref 3.5–5.0)
Alkaline Phosphatase: 75 U/L (ref 38–126)
Anion gap: 15 (ref 5–15)
BUN: 9 mg/dL (ref 6–20)
CO2: 23 mmol/L (ref 22–32)
Calcium: 9.9 mg/dL (ref 8.9–10.3)
Chloride: 98 mmol/L (ref 98–111)
Creatinine, Ser: 0.51 mg/dL (ref 0.44–1.00)
GFR, Estimated: >60 mL/min (ref >60)
Glucose, Bld: 157 mg/dL — ABNORMAL HIGH (ref 70–99)
Potassium: 4.2 mmol/L (ref 3.5–5.1)
Sodium: 136 mmol/L (ref 135–145)
Total Bilirubin: 0.4 mg/dL (ref 0.3–1.2)
Total Protein: 7.8 g/dL (ref 6.5–8.1)

## 2020-03-09 LAB — APTT: aPTT: 28 seconds (ref 24–36)

## 2020-03-09 MED ORDER — DIPHENHYDRAMINE HCL 50 MG/ML IJ SOLN
25.0000 mg | Freq: Once | INTRAMUSCULAR | Status: AC
  Administered 2020-03-09: 25 mg via INTRAVENOUS
  Filled 2020-03-09: qty 1

## 2020-03-09 MED ORDER — ONDANSETRON HCL 4 MG/2ML IJ SOLN
4.0000 mg | Freq: Once | INTRAMUSCULAR | Status: AC
  Administered 2020-03-09: 4 mg via INTRAVENOUS
  Filled 2020-03-09: qty 2

## 2020-03-09 NOTE — ED Triage Notes (Signed)
Patient states that she has had intermittent R facial numbness since 03/06/20. Has had neck pain, stiffness and numbness that runs down her R shoulder/ elbow. Also states it has been harder to walk, denies weakness worse on one side.

## 2020-03-09 NOTE — ED Provider Notes (Signed)
[facility] DEPT Provider Note: Georgena Spurling, MD, FACEP  CSN: [id number] MRN: [id number] ARRIVAL: 03/09/20 at [place]: RESA/RESA   CHIEF COMPLAINT  Neck Pain   HISTORY OF PRESENT ILLNESS  03/09/20 10:58 PM Jessica Guzman is a 60 y.o. female who has felt sluggish since Christmas Day.  She describes the sluggishness as a sensation of walking through mud or feeling heavy.  There is no focality to this and it involves her entire body.  4 days ago she began having a sharp pain and paresthesias on the right side of her neck that radiates to her right shoulder and upper arm and to the right side of her face about the ear but not involving the medial face.  This is somewhat worse with movement of the shoulders but not significantly worse with movement of the neck.  She rates the pain in the right side of her neck is a 7 out of 10.  For the past 3 days she has had intermittent episodes of vertigo by which she means the room is spinning.  This is been associated with nausea and vomiting.  Symptoms are worse with attempted movement or with certain movements of her eyes and head.  She denies Covid-like symptoms, diarrhea, or dysuria.   Past Medical History:  Diagnosis Date  . Bipolar 1 disorder ([place])   . Diabetes mellitus without complication ([place])   . Hyperlipidemia   . Hypertension   . PTSD (post-traumatic stress disorder)     Past Surgical History:  Procedure Laterality Date  . PCOS    . TUBAL LIGATION      Family History  Problem Relation Age of Onset  . Stroke Maternal Grandfather   . Diabetes Maternal Grandfather   . Pancreatic cancer Paternal Grandfather     Social History   Tobacco Use  . Smoking status: Former Smoker    Quit date: 02/01/2019    Years since quitting: 1.1  . Smokeless tobacco: Never Used  Vaping Use  . Vaping Use: Never used  Substance Use Topics  . Alcohol use: Yes    Comment: rarely  . Drug use: Never    Prior to Admission  medications   Medication Sig Start Date End Date Taking? Authorizing Provider  atorvastatin (LIPITOR) 40 MG tablet Take 40 mg by mouth at bedtime. 04/19/18  Yes [provider]  carBAMazepine (TEGRETOL PO) Take 1 tablet by mouth in the morning and at bedtime. Get Filled @ [facility], Need to verify Strength   Yes [provider]  FLUoxetine HCl (PROZAC PO) Take 30 mg by mouth daily. Take 10mg  and 20mg  totaling 30mg  daily   Yes [provider]  GABAPENTIN PO Take 1 capsule by mouth 3 (three) times daily as needed (pain). Pt takes at most 2 doses Per day, unknown strength at the moment [facility] [facility]   Yes [provider]  insulin glargine (LANTUS) 100 UNIT/ML injection Inject 38 Units into the skin at bedtime.   Yes [provider]  lisinopril (PRINIVIL,ZESTRIL) 5 MG tablet Take 5 mg by mouth daily. 04/19/18  Yes [provider]  meclizine (ANTIVERT) 25 MG tablet Take 1 tablet (25 mg total) by mouth 3 (three) times daily as needed for dizziness. 03/10/20  Yes Ludy Messamore, MD  metFORMIN (GLUCOPHAGE) 1000 MG tablet Take 1,000 mg by mouth 2 (two) times daily with a meal.   Yes [provider]  ondansetron (ZOFRAN ODT) 8 MG disintegrating  tablet Take 1 tablet (8 mg total) by mouth every 8 (eight) hours as needed for nausea or vomiting. 03/10/20  Yes Fredick Schlosser, MD  QUEtiapine (SEROQUEL) 200 MG tablet Take 1 tablet (200 mg total) at bedtime by mouth. Patient taking differently: Take 300 mg by mouth at bedtime. 01/22/17  Yes Lord, Herminio Heads, NP  fluticasone (FLONASE) 50 MCG/ACT nasal spray Place 2 sprays daily into both nostrils. Patient not taking: Reported on 03/10/2020  03/10/20  [provider]  omega-3 acid ethyl esters (LOVAZA) 1 g capsule Take 1 g by mouth daily. Patient not taking: Reported on 03/10/2020  03/10/20  [provider]  Oxcarbazepine (TRILEPTAL) 300 MG tablet Take 1 tablet (300  mg total) 2 (two) times daily by mouth. Patient not taking: No sig reported 01/22/17 03/10/20  Charm Rings, NP  PARoxetine (PAXIL) 20 MG tablet Take 20 mg by mouth daily.  Patient not taking: Reported on 03/10/2020  03/10/20  [provider]  prochlorperazine (COMPAZINE) 10 MG tablet Take 1 tablet (10 mg total) by mouth 2 (two) times daily as needed for nausea (or headache). Patient not taking: Reported on 03/10/2020 05/14/18 03/10/20  Shaune Pollack, MD    Allergies Codeine   REVIEW OF SYSTEMS  Negative except as noted here or in the History of Present Illness.   PHYSICAL EXAMINATION  Initial Vital Signs Blood pressure (!) 172/89, pulse 75, temperature 98.3 F (36.8 C), temperature source Oral, resp. rate 16, height 5\' 2"  (1.575 m), weight 81.6 kg, SpO2 98 %.  Examination General: Well-developed, well-nourished female in no acute distress; appearance consistent with age of record HENT: normocephalic; atraumatic Eyes: pupils equal, round and reactive to light; extraocular muscles intact Neck: supple, no meningeal signs; able to move through full range of motion without exacerbating pain but with exacerbation of vertigo symptoms Heart: regular rate and rhythm Lungs: clear to auscultation bilaterally Abdomen: soft; nondistended; nontender; no masses or hepatosplenomegaly; bowel sounds present Extremities: No deformity; full range of motion; pulses normal Neurologic: Awake, alert and oriented; motor function intact in all extremities and symmetric; sensation intact and symmetric in face, extremities and trunk; no facial droop Skin: Warm and dry Psychiatric: Normal mood and affect   RESULTS  Summary of this visit's results, reviewed and interpreted by myself:   EKG Interpretation  Date/Time:    Ventricular Rate:    PR Interval:    QRS Duration:   QT Interval:    QTC Calculation:   R Axis:     Text Interpretation:        Laboratory Studies: Results for orders placed  or performed during the hospital encounter of 03/09/20 (from the past 24 hour(s))  CBC     Status: Abnormal   Collection Time: 03/09/20  4:08 PM  Result Value Ref Range   WBC 13.9 (H) 4.0 - 10.5 K/uL   RBC 5.04 3.87 - 5.11 MIL/uL   Hemoglobin 13.8 12.0 - 15.0 g/dL   HCT 05/07/20 37.1 - 06.2 %   MCV 81.7 80.0 - 100.0 fL   MCH 27.4 26.0 - 34.0 pg   MCHC 33.5 30.0 - 36.0 g/dL   RDW 69.4 85.4 - 62.7 %   Platelets 329 150 - 400 K/uL   nRBC 0.0 0.0 - 0.2 %  Comprehensive metabolic panel     Status: Abnormal   Collection Time: 03/09/20  4:08 PM  Result Value Ref Range   Sodium 136 135 - 145 mmol/L   Potassium 4.2 3.5 - 5.1  mmol/L   Chloride 98 98 - 111 mmol/L   CO2 23 22 - 32 mmol/L   Glucose, Bld 157 (H) 70 - 99 mg/dL   BUN 9 6 - 20 mg/dL   Creatinine, Ser 1.610.51 0.44 - 1.00 mg/dL   Calcium 9.9 8.9 - 09.610.3 mg/dL   Total Protein 7.8 6.5 - 8.1 g/dL   Albumin 4.7 3.5 - 5.0 g/dL   AST 21 15 - 41 U/L   ALT 22 0 - 44 U/L   Alkaline Phosphatase 75 38 - 126 U/L   Total Bilirubin 0.4 0.3 - 1.2 mg/dL   GFR, Estimated >04>60 >54>60 mL/min   Anion gap 15 5 - 15  Protime-INR     Status: None   Collection Time: 03/09/20  4:08 PM  Result Value Ref Range   Prothrombin Time 12.3 11.4 - 15.2 seconds   INR 1.0 0.8 - 1.2  APTT     Status: None   Collection Time: 03/09/20  4:08 PM  Result Value Ref Range   aPTT 28 24 - 36 seconds  CBG monitoring, ED     Status: Abnormal   Collection Time: 03/09/20 11:04 PM  Result Value Ref Range   Glucose-Capillary 116 (H) 70 - 99 mg/dL   Comment 1 Notify RN    Comment 2 Document in Chart   Urinalysis, Routine w reflex microscopic     Status: Abnormal   Collection Time: 03/09/20 11:18 PM  Result Value Ref Range   Color, Urine STRAW (A) YELLOW   APPearance HAZY (A) CLEAR   Specific Gravity, Urine 1.005 1.005 - 1.030   pH 7.0 5.0 - 8.0   Glucose, UA NEGATIVE NEGATIVE mg/dL   Hgb urine dipstick NEGATIVE NEGATIVE   Bilirubin Urine NEGATIVE NEGATIVE   Ketones, ur  NEGATIVE NEGATIVE mg/dL   Protein, ur NEGATIVE NEGATIVE mg/dL   Nitrite NEGATIVE NEGATIVE   Leukocytes,Ua 108 (A) NEGATIVE   RBC / HPF 0-5 0 - 5 RBC/hpf   WBC, UA 0-5 0 - 5 WBC/hpf   Bacteria, UA RARE (A) NONE SEEN   Squamous Epithelial / LPF 6-10 0 - 5   Mucus PRESENT   TSH     Status: Abnormal   Collection Time: 03/09/20 11:18 PM  Result Value Ref Range   TSH 4.763 (H) 0.350 - 4.500 uIU/mL   Imaging Studies: DG Chest 2 View  Result Date: 03/09/2020 CLINICAL DATA:  Shortness of breath EXAM: CHEST - 2 VIEW COMPARISON:  None. FINDINGS: The heart size and mediastinal contours are within normal limits. Both lungs are clear. The visualized skeletal structures are unremarkable. IMPRESSION: No active cardiopulmonary disease. Electronically Signed   By: Jonna ClarkBindu  Avutu M.D.   On: 03/09/2020 23:43   CT Head Wo Contrast  Result Date: 03/09/2020 CLINICAL DATA:  Neck pain, right facial numbness. EXAM: CT HEAD WITHOUT CONTRAST CT CERVICAL SPINE WITHOUT CONTRAST TECHNIQUE: Multidetector CT imaging of the head and cervical spine was performed following the standard protocol without intravenous contrast. Multiplanar CT image reconstructions of the cervical spine were also generated. COMPARISON:  June 16, 2016.  May 03, 2018. FINDINGS: CT HEAD FINDINGS Brain: Mild diffuse cortical atrophy is noted. Mild chronic ischemic white matter disease is noted. No mass effect or midline shift is noted. Ventricular size is within normal limits. There is no evidence of mass lesion, hemorrhage or acute infarction. Vascular: No hyperdense vessel or unexpected calcification. Skull: Normal. Negative for fracture or focal lesion. Sinuses/Orbits: No acute finding. Other: None. CT CERVICAL  SPINE FINDINGS Alignment: Normal. Skull base and vertebrae: No acute fracture. No primary bone lesion or focal pathologic process. Soft tissues and spinal canal: No prevertebral fluid or swelling. No visible canal hematoma. Disc levels: Mild  degenerative disc disease is noted at C5-6 and C6-7. Upper chest: Negative. Other: None. IMPRESSION: 1. Mild diffuse cortical atrophy. Mild chronic ischemic white matter disease. No acute intracranial abnormality seen. 2. Mild multilevel degenerative disc disease. No acute abnormality seen in the cervical spine. Electronically Signed   By: Marijo Conception M.D.   On: 03/09/2020 15:43   CT Cervical Spine Wo Contrast  Result Date: 03/09/2020 CLINICAL DATA:  Neck pain, right facial numbness. EXAM: CT HEAD WITHOUT CONTRAST CT CERVICAL SPINE WITHOUT CONTRAST TECHNIQUE: Multidetector CT imaging of the head and cervical spine was performed following the standard protocol without intravenous contrast. Multiplanar CT image reconstructions of the cervical spine were also generated. COMPARISON:  June 16, 2016.  May 03, 2018. FINDINGS: CT HEAD FINDINGS Brain: Mild diffuse cortical atrophy is noted. Mild chronic ischemic white matter disease is noted. No mass effect or midline shift is noted. Ventricular size is within normal limits. There is no evidence of mass lesion, hemorrhage or acute infarction. Vascular: No hyperdense vessel or unexpected calcification. Skull: Normal. Negative for fracture or focal lesion. Sinuses/Orbits: No acute finding. Other: None. CT CERVICAL SPINE FINDINGS Alignment: Normal. Skull base and vertebrae: No acute fracture. No primary bone lesion or focal pathologic process. Soft tissues and spinal canal: No prevertebral fluid or swelling. No visible canal hematoma. Disc levels: Mild degenerative disc disease is noted at C5-6 and C6-7. Upper chest: Negative. Other: None. IMPRESSION: 1. Mild diffuse cortical atrophy. Mild chronic ischemic white matter disease. No acute intracranial abnormality seen. 2. Mild multilevel degenerative disc disease. No acute abnormality seen in the cervical spine. Electronically Signed   By: Marijo Conception M.D.   On: 03/09/2020 15:43    ED COURSE and MDM  Nursing  notes, initial and subsequent vitals signs, including pulse oximetry, reviewed and interpreted by myself.  Vitals:   03/09/20 2252 03/09/20 2345 03/10/20 0000 03/10/20 0100  BP: (!) 172/89 (!) 166/101 (!) 158/84 (!) 141/77  Pulse: 75 76 74 72  Resp: 16 16 15    Temp: 98.3 F (36.8 C)     TempSrc: Oral     SpO2: 98% 97% 97% 95%  Weight:      Height:       Medications  ondansetron (ZOFRAN) injection 4 mg (4 mg Intravenous Given 03/09/20 2350)  diphenhydrAMINE (BENADRYL) injection 25 mg (25 mg Intravenous Given 03/09/20 2350)   1:11 AM Patient's nausea and vertigo significantly improved after IV Zofran and Benadryl.  We will treat with oral meclizine and Zofran.  She continues to have pain in her neck especially when moving her right shoulder.  This is consistent with a cervical radiculopathy and her CT scan does show degenerative changes in the neck.  She is already on gabapentin.  She was advised of her slightly elevated TSH and will need to see her PCP regarding further evaluation and possible treatment of hypothyroidism.   PROCEDURES  Procedures   ED DIAGNOSES     ICD-10-CM   1. Vertigo  R42   2. Cervical radicular pain  M54.12   3. Elevated TSH  R79.89        Joh Rao, Jenny Reichmann, MD 03/10/20 [phone]

## 2020-03-10 LAB — TSH: TSH: 4.763 u[IU]/mL — ABNORMAL HIGH (ref 0.350–4.500)

## 2020-03-10 MED ORDER — ONDANSETRON 8 MG PO TBDP: 8.0000 mg | ORAL_TABLET | Freq: Three times a day (TID) | ORAL | 0 refills | Status: DC | PRN

## 2020-03-10 MED ORDER — MECLIZINE HCL 25 MG PO TABS: 25.0000 mg | ORAL_TABLET | Freq: Three times a day (TID) | ORAL | 0 refills | Status: DC | PRN

## 2020-07-14 DIAGNOSIS — E782 Mixed hyperlipidemia: Secondary | ICD-10-CM | POA: Diagnosis not present

## 2020-07-14 DIAGNOSIS — E1165 Type 2 diabetes mellitus with hyperglycemia: Secondary | ICD-10-CM | POA: Diagnosis not present

## 2020-07-14 DIAGNOSIS — F319 Bipolar disorder, unspecified: Secondary | ICD-10-CM | POA: Diagnosis not present

## 2020-07-14 DIAGNOSIS — I10 Essential (primary) hypertension: Secondary | ICD-10-CM | POA: Diagnosis not present

## 2020-07-14 DIAGNOSIS — E039 Hypothyroidism, unspecified: Secondary | ICD-10-CM | POA: Diagnosis not present

## 2020-07-15 DIAGNOSIS — F3161 Bipolar disorder, current episode mixed, mild: Secondary | ICD-10-CM | POA: Diagnosis not present

## 2020-07-22 DIAGNOSIS — H35033 Hypertensive retinopathy, bilateral: Secondary | ICD-10-CM | POA: Diagnosis not present

## 2020-07-22 DIAGNOSIS — E119 Type 2 diabetes mellitus without complications: Secondary | ICD-10-CM | POA: Diagnosis not present

## 2020-07-22 DIAGNOSIS — H35363 Drusen (degenerative) of macula, bilateral: Secondary | ICD-10-CM | POA: Diagnosis not present

## 2020-07-22 DIAGNOSIS — I10 Essential (primary) hypertension: Secondary | ICD-10-CM | POA: Diagnosis not present

## 2020-07-22 DIAGNOSIS — H524 Presbyopia: Secondary | ICD-10-CM | POA: Diagnosis not present

## 2020-07-23 LAB — HM DIABETES EYE EXAM

## 2020-08-05 DIAGNOSIS — Z23 Encounter for immunization: Secondary | ICD-10-CM | POA: Diagnosis not present

## 2020-08-26 DIAGNOSIS — F3161 Bipolar disorder, current episode mixed, mild: Secondary | ICD-10-CM | POA: Diagnosis not present

## 2020-08-26 DIAGNOSIS — F439 Reaction to severe stress, unspecified: Secondary | ICD-10-CM | POA: Diagnosis not present

## 2020-08-26 DIAGNOSIS — F419 Anxiety disorder, unspecified: Secondary | ICD-10-CM | POA: Diagnosis not present

## 2020-08-27 DIAGNOSIS — I10 Essential (primary) hypertension: Secondary | ICD-10-CM | POA: Diagnosis not present

## 2020-08-27 DIAGNOSIS — E1165 Type 2 diabetes mellitus with hyperglycemia: Secondary | ICD-10-CM | POA: Diagnosis not present

## 2020-08-27 DIAGNOSIS — E039 Hypothyroidism, unspecified: Secondary | ICD-10-CM | POA: Diagnosis not present

## 2020-09-09 ENCOUNTER — Other Ambulatory Visit: Payer: Self-pay | Admitting: Family Medicine

## 2020-09-09 DIAGNOSIS — Z1231 Encounter for screening mammogram for malignant neoplasm of breast: Secondary | ICD-10-CM

## 2020-09-09 DIAGNOSIS — Z1331 Encounter for screening for depression: Secondary | ICD-10-CM | POA: Diagnosis not present

## 2020-09-09 DIAGNOSIS — Z0000 Encounter for general adult medical examination without abnormal findings: Secondary | ICD-10-CM | POA: Diagnosis not present

## 2020-09-09 DIAGNOSIS — Z23 Encounter for immunization: Secondary | ICD-10-CM | POA: Diagnosis not present

## 2020-09-09 DIAGNOSIS — Z1339 Encounter for screening examination for other mental health and behavioral disorders: Secondary | ICD-10-CM | POA: Diagnosis not present

## 2020-09-18 ENCOUNTER — Ambulatory Visit
Admission: RE | Admit: 2020-09-18 | Discharge: 2020-09-18 | Disposition: A | Discharge status: Home / Self Care | Payer: PPO | Source: Ambulatory Visit | Attending: Family Medicine | Admitting: Family Medicine

## 2020-09-18 ENCOUNTER — Other Ambulatory Visit: Payer: Self-pay

## 2020-09-18 DIAGNOSIS — Z1231 Encounter for screening mammogram for malignant neoplasm of breast: Secondary | ICD-10-CM | POA: Diagnosis not present

## 2020-09-23 DIAGNOSIS — F3161 Bipolar disorder, current episode mixed, mild: Secondary | ICD-10-CM | POA: Diagnosis not present

## 2020-09-23 DIAGNOSIS — F419 Anxiety disorder, unspecified: Secondary | ICD-10-CM | POA: Diagnosis not present

## 2020-09-24 DIAGNOSIS — Z1211 Encounter for screening for malignant neoplasm of colon: Secondary | ICD-10-CM | POA: Diagnosis not present

## 2020-09-24 DIAGNOSIS — E119 Type 2 diabetes mellitus without complications: Secondary | ICD-10-CM | POA: Diagnosis not present

## 2020-09-24 DIAGNOSIS — I10 Essential (primary) hypertension: Secondary | ICD-10-CM | POA: Diagnosis not present

## 2020-10-21 DIAGNOSIS — D123 Benign neoplasm of transverse colon: Secondary | ICD-10-CM | POA: Diagnosis not present

## 2020-10-21 DIAGNOSIS — Z1211 Encounter for screening for malignant neoplasm of colon: Secondary | ICD-10-CM | POA: Diagnosis not present

## 2020-10-21 DIAGNOSIS — K635 Polyp of colon: Secondary | ICD-10-CM | POA: Diagnosis not present

## 2020-10-21 DIAGNOSIS — K5730 Diverticulosis of large intestine without perforation or abscess without bleeding: Secondary | ICD-10-CM | POA: Diagnosis not present

## 2020-10-31 ENCOUNTER — Other Ambulatory Visit: Payer: Self-pay | Admitting: Family Medicine

## 2020-11-04 DIAGNOSIS — F3161 Bipolar disorder, current episode mixed, mild: Secondary | ICD-10-CM | POA: Diagnosis not present

## 2020-11-04 DIAGNOSIS — F419 Anxiety disorder, unspecified: Secondary | ICD-10-CM | POA: Diagnosis not present

## 2020-11-05 DIAGNOSIS — Z79899 Other long term (current) drug therapy: Secondary | ICD-10-CM | POA: Diagnosis not present

## 2020-11-07 DIAGNOSIS — J309 Allergic rhinitis, unspecified: Secondary | ICD-10-CM | POA: Diagnosis not present

## 2020-11-07 DIAGNOSIS — R0683 Snoring: Secondary | ICD-10-CM | POA: Diagnosis not present

## 2020-11-07 DIAGNOSIS — E669 Obesity, unspecified: Secondary | ICD-10-CM | POA: Diagnosis not present

## 2020-11-07 DIAGNOSIS — R400 Somnolence: Secondary | ICD-10-CM | POA: Diagnosis not present

## 2020-11-07 DIAGNOSIS — E559 Vitamin D deficiency, unspecified: Secondary | ICD-10-CM | POA: Diagnosis not present

## 2020-11-07 DIAGNOSIS — R5382 Chronic fatigue, unspecified: Secondary | ICD-10-CM | POA: Diagnosis not present

## 2020-11-18 DIAGNOSIS — F419 Anxiety disorder, unspecified: Secondary | ICD-10-CM | POA: Diagnosis not present

## 2020-11-18 DIAGNOSIS — F3161 Bipolar disorder, current episode mixed, mild: Secondary | ICD-10-CM | POA: Diagnosis not present

## 2020-12-03 DIAGNOSIS — Z01419 Encounter for gynecological examination (general) (routine) without abnormal findings: Secondary | ICD-10-CM | POA: Diagnosis not present

## 2020-12-03 DIAGNOSIS — Z7185 Encounter for immunization safety counseling: Secondary | ICD-10-CM | POA: Diagnosis not present

## 2020-12-03 DIAGNOSIS — Z113 Encounter for screening for infections with a predominantly sexual mode of transmission: Secondary | ICD-10-CM | POA: Diagnosis not present

## 2020-12-03 DIAGNOSIS — Z1151 Encounter for screening for human papillomavirus (HPV): Secondary | ICD-10-CM | POA: Diagnosis not present

## 2020-12-03 DIAGNOSIS — Z23 Encounter for immunization: Secondary | ICD-10-CM | POA: Diagnosis not present

## 2020-12-03 DIAGNOSIS — I10 Essential (primary) hypertension: Secondary | ICD-10-CM | POA: Diagnosis not present

## 2020-12-03 DIAGNOSIS — Z118 Encounter for screening for other infectious and parasitic diseases: Secondary | ICD-10-CM | POA: Diagnosis not present

## 2020-12-11 LAB — HM PAP SMEAR

## 2020-12-23 DIAGNOSIS — F419 Anxiety disorder, unspecified: Secondary | ICD-10-CM | POA: Diagnosis not present

## 2020-12-23 DIAGNOSIS — F3161 Bipolar disorder, current episode mixed, mild: Secondary | ICD-10-CM | POA: Diagnosis not present

## 2021-01-12 DIAGNOSIS — Z23 Encounter for immunization: Secondary | ICD-10-CM | POA: Diagnosis not present

## 2021-01-28 ENCOUNTER — Ambulatory Visit (INDEPENDENT_AMBULATORY_CARE_PROVIDER_SITE_OTHER): Payer: PPO | Admitting: Neurology

## 2021-01-28 ENCOUNTER — Encounter: Payer: Self-pay | Admitting: Neurology

## 2021-01-28 ENCOUNTER — Other Ambulatory Visit: Payer: Self-pay

## 2021-01-28 VITALS — BP 131/73 | HR 73 | Ht 62.0 in | Wt 179.0 lb

## 2021-01-28 DIAGNOSIS — F5105 Insomnia due to other mental disorder: Secondary | ICD-10-CM | POA: Diagnosis not present

## 2021-01-28 DIAGNOSIS — R0683 Snoring: Secondary | ICD-10-CM | POA: Diagnosis not present

## 2021-01-28 DIAGNOSIS — F99 Mental disorder, not otherwise specified: Secondary | ICD-10-CM | POA: Insufficient documentation

## 2021-01-28 DIAGNOSIS — F3112 Bipolar disorder, current episode manic without psychotic features, moderate: Secondary | ICD-10-CM

## 2021-01-28 DIAGNOSIS — G4759 Other parasomnia: Secondary | ICD-10-CM | POA: Insufficient documentation

## 2021-01-28 DIAGNOSIS — F5104 Psychophysiologic insomnia: Secondary | ICD-10-CM | POA: Diagnosis not present

## 2021-01-28 NOTE — Patient Instructions (Signed)
Screening for Sleep Apnea Sleep apnea is a condition in which breathing pauses or becomes shallow during sleep. Sleep apnea screening is a test to determine if you are at risk for sleep apnea. The test includes a series of questions. It will only takes a few minutes. Your health care provider may ask you to have this test in preparation for surgery or as part of a physical exam. What are the symptoms of sleep apnea? Common symptoms of sleep apnea include: Snoring. Waking up often at night. Daytime sleepiness. Pauses in breathing. Choking or gasping during sleep. Irritability. Forgetfulness. Trouble thinking clearly. Depression. Personality changes. Most people with sleep apnea do not know that they have it. What are the advantages of sleep apnea screening? Getting screened for sleep apnea can help: Ensure your safety. It is important for your health care providers to know whether or not you have sleep apnea, especially if you are having surgery or have other long-term (chronic) health conditions. Improve your health and allow you to get a better night's rest. Restful sleep can help you: Have more energy. Lose weight. Improve high blood pressure. Improve diabetes management. Prevent stroke. Prevent car accidents. What happens during the screening? Screening usually includes being asked a list of questions about your sleep quality. Some questions you may be asked include: Do you snore? Is your sleep restless? Do you have daytime sleepiness? Has a partner or spouse told you that you stop breathing during sleep? Have you had trouble concentrating or memory loss? What is your age? What is your neck circumference? To measure your neck, keep your back straight and gently wrap the tape measure around your neck. Put the tape measure at the middle of your neck, between your chin and collarbone. What is your sex assigned at birth? Do you have or are you being treated for high blood  pressure? If your screening test is positive, you are at risk for the condition. Further testing may be needed to confirm a diagnosis of sleep apnea. Where to find more information You can find screening tools online or at your health care clinic. For more information about sleep apnea screening and healthy sleep, visit these websites: Centers for Disease Control and Prevention: http://www.wolf.info/ American Sleep Apnea Association: www.sleepapnea.org Contact a health care provider if: You think that you may have sleep apnea. Summary Sleep apnea screening can help determine if you are at risk for sleep apnea. It is important for your health care providers to know whether or not you have sleep apnea, especially if you are having surgery or have other chronic health conditions. You may be asked to take a screening test for sleep apnea in preparation for surgery or as part of a physical exam. This information is not intended to replace advice given to you by your health care provider. Make sure you discuss any questions you have with your health care provider. Document Revised: 02/01/2020 Document Reviewed: 02/01/2020 Elsevier Patient Education  2022 [place]. Insomnia Insomnia is a sleep disorder that makes it difficult to fall asleep or stay asleep. Insomnia can cause fatigue, low energy, difficulty concentrating, mood swings, and poor performance at work or school. There are three different ways to classify insomnia: Difficulty falling asleep. Difficulty staying asleep. Waking up too early in the morning. Any type of insomnia can be long-term (chronic) or short-term (acute). Both are common. Short-term insomnia usually lasts for three months or less. Chronic insomnia occurs at least three times a week for longer than three  months. What are the causes? Insomnia may be caused by another condition, situation, or substance, such as: Anxiety. Certain medicines. Gastroesophageal reflux disease (GERD)  or other gastrointestinal conditions. Asthma or other breathing conditions. Restless legs syndrome, sleep apnea, or other sleep disorders. Chronic pain. Menopause. Stroke. Abuse of alcohol, tobacco, or illegal drugs. Mental health conditions, such as depression. Caffeine. Neurological disorders, such as Alzheimer's disease. An overactive thyroid (hyperthyroidism). Sometimes, the cause of insomnia may not be known. What increases the risk? Risk factors for insomnia include: Gender. Women are affected more often than men. Age. Insomnia is more common as you get older. Stress. Lack of exercise. Irregular work schedule or working night shifts. Traveling between different time zones. Certain medical and mental health conditions. What are the signs or symptoms? If you have insomnia, the main symptom is having trouble falling asleep or having trouble staying asleep. This may lead to other symptoms, such as: Feeling fatigued or having low energy. Feeling nervous about going to sleep. Not feeling rested in the morning. Having trouble concentrating. Feeling irritable, anxious, or depressed. How is this diagnosed? This condition may be diagnosed based on: Your symptoms and medical history. Your health care provider may ask about: Your sleep habits. Any medical conditions you have. Your mental health. A physical exam. How is this treated? Treatment for insomnia depends on the cause. Treatment may focus on treating an underlying condition that is causing insomnia. Treatment may also include: Medicines to help you sleep. Counseling or therapy. Lifestyle adjustments to help you sleep better. Follow these instructions at home: Eating and drinking  Limit or avoid alcohol, caffeinated beverages, and cigarettes, especially close to bedtime. These can disrupt your sleep. Do not eat a large meal or eat spicy foods right before bedtime. This can lead to digestive discomfort that can make it  hard for you to sleep. Sleep habits  Keep a sleep diary to help you and your health care provider figure out what could be causing your insomnia. Write down: When you sleep. When you wake up during the night. How well you sleep. How rested you feel the next day. Any side effects of medicines you are taking. What you eat and drink. Make your bedroom a dark, comfortable place where it is easy to fall asleep. Put up shades or blackout curtains to block light from outside. Use a white noise machine to block noise. Keep the temperature cool. Limit screen use before bedtime. This includes: Watching TV. Using your smartphone, tablet, or computer. Stick to a routine that includes going to bed and waking up at the same times every day and night. This can help you fall asleep faster. Consider making a quiet activity, such as reading, part of your nighttime routine. Try to avoid taking naps during the day so that you sleep better at night. Get out of bed if you are still awake after 15 minutes of trying to sleep. Keep the lights down, but try reading or doing a quiet activity. When you feel sleepy, go back to bed. General instructions Take over-the-counter and prescription medicines only as told by your health care provider. Exercise regularly, as told by your health care provider. Avoid exercise starting several hours before bedtime. Use relaxation techniques to manage stress. Ask your health care provider to suggest some techniques that may work well for you. These may include: Breathing exercises. Routines to release muscle tension. Visualizing peaceful scenes. Make sure that you drive carefully. Avoid driving if you feel very sleepy. Keep  all follow-up visits as told by your health care provider. This is important. Contact a health care provider if: You are tired throughout the day. You have trouble in your daily routine due to sleepiness. You continue to have sleep problems, or your sleep  problems get worse. Get help right away if: You have serious thoughts about hurting yourself or someone else. If you ever feel like you may hurt yourself or others, or have thoughts about taking your own life, get help right away. You can go to your nearest emergency department or call: Your local emergency services (911 in the [place] A suicide crisis helpline, such as the [facility] at (904)587-3531 or 988 in the [place]. This is open 24 hours a day. Summary Insomnia is a sleep disorder that makes it difficult to fall asleep or stay asleep. Insomnia can be long-term (chronic) or short-term (acute). Treatment for insomnia depends on the cause. Treatment may focus on treating an underlying condition that is causing insomnia. Keep a sleep diary to help you and your health care provider figure out what could be causing your insomnia. This information is not intended to replace advice given to you by your health care provider. Make sure you discuss any questions you have with your health care provider. Document Revised: 09/17/2020 Document Reviewed: 01/03/2020 Elsevier Patient Education  2022 [employer].

## 2021-01-28 NOTE — Progress Notes (Signed)
SLEEP MEDICINE CLINIC    Provider:  Larey Seat, MD  Primary Care Physician:  Elbert Ewings, [place] [address]     Referring Provider: Fanny Bien, Md [address] [place],  [place] [address]          Chief Complaint according to patient   Patient presents with:     New Patient (Initial Visit)      Bipolar patient with chronic insomnia, cyclic insomnia,chronic fatigue. " My brain doesn't shut off" . I feel I don't sleep well at all.       HISTORY OF PRESENT ILLNESS:  Jessica Guzman is a 60 year old Caucasian female patient and was seen here upon a sleep Consultation on 01/28/2021 from Dr. Ival Bible office, [place].  Chief concern according to patient : " I was asked to see a neurologist for a sleep study"   Jessica Guzman  has a past medical history of Bipolar 1 disorder ( age 32 -[place]), psychotic breakthrough, medication induced weight gain ( Depakote) ,  post partum depression ,Diabetes mellitus without complication ([place]), Hyperlipidemia, Hypertension, Lithium medication, and PTSD (post-traumatic stress disorder).   The patient has chronic insomnia and cyclic, too. Episodic insomnia changes related to mania or depression. 3-4 weeks ago last anxiety augmented and she needs more gabapentin.  Sleep relevant medical history: Snoring, Nocturia, Sleep walking, Hip pain, fallen out of bed from fighting  or running away in her dreams, no Tonsillectomy, nasal septum deviation.   Family medical /sleep history: brother and sister on CPAP with OSA, bipolar disorder related insomnia, sleep walkers, obesity in both siblings.     Social history:  Patient is disabled, former Medical illustrator, and lives in a household alone with a cat.  Family status is divorced , with 2 adult children in [place].   Tobacco use: quit 2 weeks ago.  ETOH use ; rare,  Caffeine intake in form of Coffee( 2 cups a day) Soda( /) Tea ( /) or energy drinks. Regular  exercise in form of walking.    Sleep habits are as follows: The patient's dinner time is between 5-6 PM. The patient goes to bed at 10-11 PM and continues ( currently for the last 3 weeks ) to sleep for 7-8 hours. Goes to bathroom 1-2 times.  The preferred sleep position is supine - due to hip pain-, with the support of 1 pillow.  Dreams are reportedly frequent/vivid- and upsetting.  7  AM is the usual rise time. When manic, its as early as 5 AM.  The patient wakes up spontaneously at 6.30-  7 AM>.  She reports not feeling refreshed or restored in AM, with symptoms such as dry mouth, morning stiffness and aches and residual fatigue. Naps are taken in form of "resting - not sleeping- mind keeps racing.    Review of Systems: Out of a complete 14 system review, the patient complains of only the following symptoms, and all other reviewed systems are negative.:  Fatigue, snoring, chronic  Insomnia , parasomnia,    How likely are you to doze in the following situations: 0 = not likely, 1 = slight chance, 2 = moderate chance, 3 = high chance   Sitting and Reading? Watching Television? Sitting inactive in a public place (theater or meeting)? As a passenger in a car for an hour without a break? Lying down in the afternoon when circumstances permit? Sitting and talking to someone? Sitting quietly  after lunch without alcohol? In a car, while stopped for a few minutes in traffic?   Total = 5/ 24 points   FSS endorsed at 50/ 63 points.   Social History   Socioeconomic History   Marital status: Divorced    Spouse name: Not on file   Number of children: Not on file   Years of education: Not on file   Highest education level: Bachelor's degree (e.g., BA, AB, BS)  Occupational History   Not on file  Tobacco Use   Smoking status: Former    Types: Cigarettes    Quit date: 02/01/2019    Years since quitting: 1.9   Smokeless tobacco: Never  Vaping Use   Vaping Use: Never used  Substance and  Sexual Activity   Alcohol use: Yes    Comment: rarely   Drug use: Never   Sexual activity: Not Currently  Other Topics Concern   Not on file  Social History Narrative   Lives alone   Right handed   Caffeine: 2-4 cups of coffee   Social Determinants of Health   Financial Resource Strain: Not on file  Food Insecurity: Not on file  Transportation Needs: Not on file  Physical Activity: Not on file  Stress: Not on file  Social Connections: Not on file    Family History  Problem Relation Age of Onset   Alzheimer's disease Mother    Diabetes Sister    High Cholesterol Sister    High blood pressure Sister    Bipolar disorder Brother    Stroke Maternal Grandfather    Diabetes Maternal Grandfather    Pancreatic cancer Paternal Grandfather     Past Medical History:  Diagnosis Date   Bipolar 1 disorder ([place])    Diabetes mellitus without complication ([place])    Hyperlipidemia    Hypertension    PTSD (post-traumatic stress disorder)     Past Surgical History:  Procedure Laterality Date   PCOS     TUBAL LIGATION       Current Outpatient Medications on File Prior to Visit  Medication Sig Dispense Refill   atorvastatin (LIPITOR) 40 MG tablet Take 40 mg by mouth at bedtime.     carBAMazepine (TEGRETOL PO) Take 1 tablet by mouth in the morning and at bedtime. Get Filled @ [facility], Need to verify Strength     FLUoxetine HCl (PROZAC PO) Take 40 mg by mouth daily.     gabapentin (NEURONTIN) 300 MG capsule Take 1 capsule by mouth 2 (two) times daily.     insulin glargine (LANTUS) 100 UNIT/ML injection Inject 38 Units into the skin at bedtime.     levothyroxine (SYNTHROID) 25 MCG tablet Take 25 mcg by mouth every morning.     lisinopril (PRINIVIL,ZESTRIL) 5 MG tablet Take 5 mg by mouth daily.     meclizine (ANTIVERT) 25 MG tablet Take 1 tablet (25 mg total) by mouth 3 (three) times daily as needed for dizziness. 30 tablet 0   ondansetron (ZOFRAN ODT) 8 MG  disintegrating tablet Take 1 tablet (8 mg total) by mouth every 8 (eight) hours as needed for nausea or vomiting. 20 tablet 0   QUEtiapine (SEROQUEL) 300 MG tablet Take 300 mg by mouth daily.     [DISCONTINUED] fluticasone (FLONASE) 50 MCG/ACT nasal spray Place 2 sprays daily into both nostrils. (Patient not taking: Reported on 03/10/2020)     [DISCONTINUED] omega-3 acid ethyl esters (LOVAZA) 1 g capsule Take 1 g by mouth daily. (Patient  not taking: Reported on 03/10/2020)     [DISCONTINUED] Oxcarbazepine (TRILEPTAL) 300 MG tablet Take 1 tablet (300 mg total) 2 (two) times daily by mouth. (Patient not taking: Reported on 02/09/2018) 60 tablet 0   [DISCONTINUED] PARoxetine (PAXIL) 20 MG tablet Take 20 mg by mouth daily.  (Patient not taking: Reported on 03/10/2020)     [DISCONTINUED] prochlorperazine (COMPAZINE) 10 MG tablet Take 1 tablet (10 mg total) by mouth 2 (two) times daily as needed for nausea (or headache). (Patient not taking: Reported on 03/10/2020) 14 tablet 0   No current facility-administered medications on file prior to visit.    Allergies  Allergen Reactions   Codeine Nausea And Vomiting    Physical exam:  Today's Vitals   01/28/21 0846  BP: 131/73  Pulse: 73  Weight: 179 lb (81.2 kg)  Height: 5\' 2"  (1.575 m)   Body mass index is 32.74 kg/m.   Wt Readings from Last 3 Encounters:  01/28/21 179 lb (81.2 kg)  03/09/20 180 lb (81.6 kg)  02/15/19 184 lb (83.5 kg)     Ht Readings from Last 3 Encounters:  01/28/21 5\' 2"  (1.575 m)  03/09/20 5\' 2"  (1.575 m)  05/03/18 5\' 2"  (1.575 m)      General: The patient is awake, alert and appears not in acute distress. The patient is well groomed. Head: Normocephalic, atraumatic. Neck is supple. Mallampati 2-3,  neck circumference:16 inches . Nasal airflow not fully patent.  Retrognathia is seen.  Dental status: partial dentures.  Cardiovascular:  Regular rate and cardiac rhythm by pulse,  without distended neck veins. Respiratory:  Lungs are clear to auscultation.  Skin:  Without evidence of ankle edema, or rash. Trunk: The patient's posture is erect.   Neurologic exam : The patient is awake and alert, oriented to place and time.   Memory subjective described as intact.  Attention span & concentration ability appears normal.  Speech is fluent,  without  dysarthria, dysphonia or aphasia.  Mood and affect are appropriate.   Cranial nerves: no loss of smell or taste reported  Pupils are equal and briskly reactive to light. Funduscopic exam deferred.  Extraocular movements in vertical and horizontal planes were intact and without nystagmus. No Diplopia. Visual fields by finger perimetry are intact. Hearing was intact to soft voice and finger rubbing.    Facial sensation intact to fine touch.  Facial motor strength is symmetric and tongue and uvula move midline.  Neck ROM : rotation, tilt and flexion extension were normal for age and shoulder shrug was symmetrical.    Motor exam:  Symmetric bulk, tone and ROM.   Normal tone without cog wheeling, symmetric grip strength .   Sensory:  Fine touch, pinprick and vibration were tested  and  normal.  Proprioception tested in the upper extremities was normal. Coordination: Rapid alternating movements in the fingers/hands were of normal speed.  The Finger-to-nose maneuver was intact. Gait and station: Hip pain reported- Patient could rise unassisted from a seated position, walked without assistive device.  Stance is of normal width/ base. Toe and heel walk were deferred.  Deep tendon reflexes: in the upper and lower extremities are symmetric and intact.  Babinski response was deferred.     After spending a total time of  45  minutes face to face and additional time for physical and neurologic examination, review of laboratory studies, all paper notes-  personal review of imaging studies, reports and results of other testing and review of referral information /  records as  far as provided in visit, I have established the following assessments:  1)  Insomnia is cyclic and correlates to mental health  2)  snoring since she became obese, but no history of apnea.  3)  chronic pain forces her to sleep supine.  4) parasomnia reported, can be related to PTSD.   My Plan is to proceed with:  1) attended sleep study with parasomnia montage,  2)non organic  insomnia cannot be addressed here    I would like to thank Fanny Bien, Md [address] [place],  [place] [address] for allowing me to meet with and to take care of this pleasant patient.    I plan to follow up either personally or through our NP within 3-4 month.   CC: I will share my notes with PCP.  Electronically signed by: Larey Seat, MD 01/28/2021 9:19 AM  [facility] Board certified by The [employer] of Sleep Medicine and Diplomate of the [employer] of Sleep Medicine. Board certified In Neurology through the [place], Fellow of the [employer] of Neurology. Medical Director of [facility].

## 2021-02-02 DIAGNOSIS — E559 Vitamin D deficiency, unspecified: Secondary | ICD-10-CM | POA: Diagnosis not present

## 2021-02-02 DIAGNOSIS — E1165 Type 2 diabetes mellitus with hyperglycemia: Secondary | ICD-10-CM | POA: Diagnosis not present

## 2021-02-02 DIAGNOSIS — E039 Hypothyroidism, unspecified: Secondary | ICD-10-CM | POA: Diagnosis not present

## 2021-02-02 DIAGNOSIS — E782 Mixed hyperlipidemia: Secondary | ICD-10-CM | POA: Diagnosis not present

## 2021-02-02 DIAGNOSIS — I10 Essential (primary) hypertension: Secondary | ICD-10-CM | POA: Diagnosis not present

## 2021-02-02 LAB — COMPREHENSIVE METABOLIC PANEL
Albumin: 4.3 (ref 3.5–5.0)
Calcium: 9.5 (ref 8.7–10.7)
GFR calc Af Amer: 105

## 2021-02-02 LAB — BASIC METABOLIC PANEL
BUN: 11 (ref 4–21)
CO2: 27 — AB (ref 13–22)
Chloride: 100 (ref 99–108)
Creatinine: 0.5 (ref 0.5–1.1)
Glucose: 122
Potassium: 4.7 (ref 3.4–5.3)
Sodium: 136 — AB (ref 137–147)

## 2021-02-02 LAB — CBC AND DIFFERENTIAL
HCT: 37 (ref 36–46)
Hemoglobin: 12.4 (ref 12.0–16.0)
WBC: 9.3

## 2021-02-02 LAB — TSH: TSH: 1.32 (ref 0.41–5.90)

## 2021-02-02 LAB — LIPID PANEL
Cholesterol: 195 (ref 0–200)
HDL: 51 (ref 35–70)
LDL Cholesterol: 114
LDl/HDL Ratio: 2.2
Triglycerides: 152 (ref 40–160)

## 2021-02-02 LAB — HEMOGLOBIN A1C: Hemoglobin A1C: 6.3

## 2021-02-03 ENCOUNTER — Telehealth: Payer: Self-pay | Admitting: Neurology

## 2021-02-03 NOTE — Telephone Encounter (Signed)
Called to get pt scheduled for her sleep study but pt has no VM.

## 2021-02-06 DIAGNOSIS — E1165 Type 2 diabetes mellitus with hyperglycemia: Secondary | ICD-10-CM | POA: Diagnosis not present

## 2021-02-06 DIAGNOSIS — E782 Mixed hyperlipidemia: Secondary | ICD-10-CM | POA: Diagnosis not present

## 2021-02-06 DIAGNOSIS — E559 Vitamin D deficiency, unspecified: Secondary | ICD-10-CM | POA: Diagnosis not present

## 2021-02-06 DIAGNOSIS — E039 Hypothyroidism, unspecified: Secondary | ICD-10-CM | POA: Diagnosis not present

## 2021-02-06 DIAGNOSIS — I10 Essential (primary) hypertension: Secondary | ICD-10-CM | POA: Diagnosis not present

## 2021-02-06 DIAGNOSIS — A599 Trichomoniasis, unspecified: Secondary | ICD-10-CM | POA: Diagnosis not present

## 2021-02-10 ENCOUNTER — Telehealth: Payer: Self-pay | Admitting: Neurology

## 2021-02-10 NOTE — Telephone Encounter (Signed)
LVM for pt to call me back to schedule sleep study  

## 2021-02-19 ENCOUNTER — Ambulatory Visit (INDEPENDENT_AMBULATORY_CARE_PROVIDER_SITE_OTHER): Payer: PPO | Admitting: Internal Medicine

## 2021-02-19 ENCOUNTER — Encounter: Payer: Self-pay | Admitting: Internal Medicine

## 2021-02-19 ENCOUNTER — Other Ambulatory Visit: Payer: Self-pay

## 2021-02-19 VITALS — BP 148/82 | HR 80 | Temp 98.9°F | Resp 16 | Ht 62.0 in | Wt 180.0 lb

## 2021-02-19 DIAGNOSIS — I10 Essential (primary) hypertension: Secondary | ICD-10-CM

## 2021-02-19 DIAGNOSIS — E119 Type 2 diabetes mellitus without complications: Secondary | ICD-10-CM | POA: Insufficient documentation

## 2021-02-19 DIAGNOSIS — Z794 Long term (current) use of insulin: Secondary | ICD-10-CM

## 2021-02-19 DIAGNOSIS — E118 Type 2 diabetes mellitus with unspecified complications: Secondary | ICD-10-CM | POA: Insufficient documentation

## 2021-02-19 MED ORDER — GVOKE HYPOPEN 2-PACK 1 MG/0.2ML ~~LOC~~ SOAJ: 1.0000 | Freq: Every day | SUBCUTANEOUS | 5 refills | Status: DC | PRN

## 2021-02-19 MED ORDER — OLMESARTAN MEDOXOMIL 20 MG PO TABS: 20.0000 mg | ORAL_TABLET | Freq: Every day | ORAL | 0 refills | Status: DC

## 2021-02-19 MED ORDER — INSULIN GLARGINE 100 UNIT/ML ~~LOC~~ SOLN: 20.0000 [IU] | Freq: Every day | SUBCUTANEOUS | 0 refills | Status: DC

## 2021-02-19 MED ORDER — DEXCOM G6 TRANSMITTER MISC
1.0000 | Freq: Every day | 5 refills | Status: DC
Start: 2021-02-19 — End: 2021-03-26

## 2021-02-19 MED ORDER — DEXCOM G6 RECEIVER DEVI: 1.0000 | Freq: Every day | 5 refills | Status: DC

## 2021-02-19 MED ORDER — DEXCOM G6 SENSOR MISC: 1.0000 | Freq: Every day | 5 refills | Status: DC

## 2021-02-19 NOTE — Patient Instructions (Signed)

## 2021-02-19 NOTE — Progress Notes (Signed)
Subjective:  Patient ID: Jessica Guzman, female    DOB: [birth year]  Age: 60 y.o. MRN: [id number]  CC: Hypertension and Diabetes   This visit occurred during the SARS-CoV-2 public health emergency.  Safety protocols were in place, including screening questions prior to the visit, additional usage of staff PPE, and extensive cleaning of exam room while observing appropriate contact time as indicated for disinfecting solutions.    HPI Jessica Guzman presents for f/up and to establish.  She has a history of IDDM.  Her recent A1c was 6.3%.  She has had a few episodes recently of mild hypoglycemia.  She is active and denies headache, blurred vision, chest pain, shortness of breath, diaphoresis, dizziness, lightheadedness, or edema.  History Jessica Guzman has a past medical history of Bipolar 1 disorder ([place]), Diabetes mellitus without complication ([place]), Hyperlipidemia, Hypertension, and PTSD (post-traumatic stress disorder).   She has a past surgical history that includes Tubal ligation and PCOS.   Her family history includes Alzheimer's disease in her mother; Bipolar disorder in her brother; Diabetes in her maternal grandfather and sister; High Cholesterol in her sister; High blood pressure in her sister; Pancreatic cancer in her paternal grandfather; Stroke in her maternal grandfather.She reports that she has been smoking cigarettes. She has never used smokeless tobacco. She reports that she does not currently use alcohol. She reports that she does not use drugs.  Outpatient Medications Prior to Visit  Medication Sig Dispense Refill   atorvastatin (LIPITOR) 40 MG tablet Take 40 mg by mouth at bedtime.     carBAMazepine (TEGRETOL PO) Take 1 tablet by mouth in the morning and at bedtime. Get Filled @ [facility], Need to verify Strength     FLUoxetine HCl (PROZAC PO) Take 40 mg by mouth daily.     gabapentin (NEURONTIN) 300 MG capsule Take 1 capsule by mouth 2 (two) times daily.      levothyroxine (SYNTHROID) 25 MCG tablet Take 25 mcg by mouth every morning.     QUEtiapine (SEROQUEL) 300 MG tablet Take 300 mg by mouth daily.     insulin glargine (LANTUS) 100 UNIT/ML injection Inject 38 Units into the skin at bedtime.     lisinopril (PRINIVIL,ZESTRIL) 5 MG tablet Take 5 mg by mouth daily.     meclizine (ANTIVERT) 25 MG tablet Take 1 tablet (25 mg total) by mouth 3 (three) times daily as needed for dizziness. 30 tablet 0   ondansetron (ZOFRAN ODT) 8 MG disintegrating tablet Take 1 tablet (8 mg total) by mouth every 8 (eight) hours as needed for nausea or vomiting. 20 tablet 0   No facility-administered medications prior to visit.    ROS Review of Systems  Constitutional:  Negative for diaphoresis, fatigue and unexpected weight change.  HENT: Negative.    Eyes: Negative.   Respiratory:  Negative for cough, chest tightness, shortness of breath and wheezing.   Cardiovascular:  Negative for chest pain, palpitations and leg swelling.  Gastrointestinal:  Negative for abdominal pain, constipation, diarrhea, nausea and vomiting.  Endocrine: Negative.  Negative for polydipsia, polyphagia and polyuria.  Genitourinary: Negative.  Negative for decreased urine volume and difficulty urinating.  Musculoskeletal: Negative.  Negative for arthralgias and myalgias.  Skin: Negative.   Neurological:  Negative for dizziness, weakness and light-headedness.  Hematological:  Negative for adenopathy. Does not bruise/bleed easily.  Psychiatric/Behavioral: Negative.     Objective:  BP (!) 148/82 (BP Location: Right Arm, Patient Position: Sitting, Cuff Size: Normal)    Pulse  80    Temp 98.9 F (37.2 C) (Oral)    Resp 16    Ht 5\' 2"  (1.575 m)    Wt 180 lb (81.6 kg)    SpO2 99%    BMI 32.92 kg/m   Physical Exam Vitals reviewed.  Constitutional:      Appearance: Normal appearance.  HENT:     Nose: Nose normal.     Mouth/Throat:     Mouth: Mucous membranes are moist.  Eyes:      General: No scleral icterus.    Conjunctiva/sclera: Conjunctivae normal.  Cardiovascular:     Rate and Rhythm: Normal rate and regular rhythm.     Heart sounds: No murmur heard. Pulmonary:     Effort: Pulmonary effort is normal.     Breath sounds: No stridor. No wheezing, rhonchi or rales.  Abdominal:     General: Abdomen is flat.     Palpations: There is no mass.     Tenderness: There is no abdominal tenderness. There is no guarding.     Hernia: No hernia is present.  Musculoskeletal:        General: Normal range of motion.     Cervical back: Neck supple.     Right lower leg: No edema.     Left lower leg: No edema.  Lymphadenopathy:     Cervical: No cervical adenopathy.  Skin:    General: Skin is warm and dry.  Neurological:     General: No focal deficit present.     Mental Status: She is alert.  Psychiatric:        Mood and Affect: Mood normal.        Behavior: Behavior normal.    Lab Results  Component Value Date   WBC 9.3 02/02/2021   HGB 12.4 02/02/2021   HCT 37 02/02/2021   PLT 329 03/09/2020   GLUCOSE 157 (H) 03/09/2020   CHOL 195 02/02/2021   TRIG 152 02/02/2021   HDL 51 02/02/2021   LDLCALC 114 02/02/2021   ALT 22 03/09/2020   AST 21 03/09/2020   NA 136 (A) 02/02/2021   K 4.7 02/02/2021   CL 100 02/02/2021   CREATININE 0.5 02/02/2021   BUN 11 02/02/2021   CO2 27 (A) 02/02/2021   TSH 1.32 02/02/2021   INR 1.0 03/09/2020   HGBA1C 6.3 02/02/2021     Assessment & Plan:   Jessica Guzman was seen today for hypertension and diabetes.  Diagnoses and all orders for this visit:  Type II diabetes mellitus with complication ([place])- See below. -     HM Diabetes Foot Exam  Insulin-requiring or dependent type II diabetes mellitus ([place])- Her A1c is at 6.3%.  I recommended that she reduce her basal insulin dose by 50%. -     HM Diabetes Foot Exam -     Continuous Blood Gluc Receiver (DEXCOM G6 RECEIVER) DEVI; 1 Act by Does not apply route daily. -     Continuous Blood  Gluc Transmit (DEXCOM G6 TRANSMITTER) MISC; 1 Act by Does not apply route daily. -     Continuous Blood Gluc Sensor (DEXCOM G6 SENSOR) MISC; 1 Act by Does not apply route daily. -     Discontinue: insulin glargine (LANTUS) 100 UNIT/ML injection; Inject 0.2 mLs (20 Units total) into the skin at bedtime. -     Glucagon (GVOKE HYPOPEN 2-PACK) 1 MG/0.2ML SOAJ; Inject 1 Act into the skin daily as needed. -     Amb Referral to Nutrition and  Diabetic Education  Primary hypertension- She has not achieved her blood pressure goal of 130/80.  I recommended that she switch to an ARB. -     olmesartan (BENICAR) 20 MG tablet; Take 1 tablet (20 mg total) by mouth daily.   I have discontinued Jess Sulak. [place]'s insulin glargine, lisinopril, ondansetron, and meclizine. I am also having her start on Dexcom G6 Receiver, Dexcom G6 Transmitter, Dexcom G6 Sensor, olmesartan, and Gvoke HypoPen 2-Pack. Additionally, I am having her maintain her atorvastatin, FLUoxetine HCl (PROZAC PO), carBAMazepine (TEGRETOL PO), gabapentin, QUEtiapine, and levothyroxine.  Meds ordered this encounter  Medications   Continuous Blood Gluc Receiver (DEXCOM G6 RECEIVER) DEVI    Sig: 1 Act by Does not apply route daily.    Dispense:  1 each    Refill:  5   Continuous Blood Gluc Transmit (DEXCOM G6 TRANSMITTER) MISC    Sig: 1 Act by Does not apply route daily.    Dispense:  1 each    Refill:  5   Continuous Blood Gluc Sensor (DEXCOM G6 SENSOR) MISC    Sig: 1 Act by Does not apply route daily.    Dispense:  1 each    Refill:  5   olmesartan (BENICAR) 20 MG tablet    Sig: Take 1 tablet (20 mg total) by mouth daily.    Dispense:  90 tablet    Refill:  0   DISCONTD: insulin glargine (LANTUS) 100 UNIT/ML injection    Sig: Inject 0.2 mLs (20 Units total) into the skin at bedtime.    Dispense:  18 mL    Refill:  0   Glucagon (GVOKE HYPOPEN 2-PACK) 1 MG/0.2ML SOAJ    Sig: Inject 1 Act into the skin daily as needed.    Dispense:  2 mL     Refill:  5     Follow-up: Return in about 3 months (around 05/20/2021).  Scarlette Calico, MD

## 2021-02-20 ENCOUNTER — Telehealth: Payer: Self-pay

## 2021-02-20 NOTE — Telephone Encounter (Signed)
Key: [id number]

## 2021-02-24 ENCOUNTER — Other Ambulatory Visit: Payer: Self-pay | Admitting: Internal Medicine

## 2021-02-24 ENCOUNTER — Telehealth: Payer: Self-pay | Admitting: Internal Medicine

## 2021-02-24 DIAGNOSIS — Z794 Long term (current) use of insulin: Secondary | ICD-10-CM

## 2021-02-24 DIAGNOSIS — F3161 Bipolar disorder, current episode mixed, mild: Secondary | ICD-10-CM | POA: Diagnosis not present

## 2021-02-24 DIAGNOSIS — F419 Anxiety disorder, unspecified: Secondary | ICD-10-CM | POA: Diagnosis not present

## 2021-02-24 MED ORDER — LANTUS SOLOSTAR 100 UNIT/ML ~~LOC~~ SOPN: 20.0000 [IU] | PEN_INJECTOR | Freq: Every day | SUBCUTANEOUS | 1 refills | Status: DC

## 2021-02-24 NOTE — Telephone Encounter (Signed)
Per CoverMyMeds:  Pa was denied.

## 2021-02-24 NOTE — Telephone Encounter (Signed)
Patient states provider prescribed insulin glargine (LANTUS) 100 UNIT/ML injection  Patient states she is not familiar w/ this form of injection  Patient states she was prescribed the lantus solostar pen in the past  Patient requesting a call back to discuss the change in prescription

## 2021-02-25 ENCOUNTER — Encounter: Payer: PPO | Attending: Internal Medicine | Admitting: Nutrition

## 2021-02-25 ENCOUNTER — Other Ambulatory Visit: Payer: Self-pay

## 2021-02-25 DIAGNOSIS — E118 Type 2 diabetes mellitus with unspecified complications: Secondary | ICD-10-CM | POA: Diagnosis not present

## 2021-03-08 NOTE — Patient Instructions (Signed)
Read over manual Call 800 help line if questions, or if sensor falls off for a free replacement Call office if blood sugars remain over 200.

## 2021-03-08 NOTE — Progress Notes (Signed)
Patient was trained on the use of the Dexcom G6 SBGM system. We discussed the difference between sensor readings and blood sugar testing, and when to do a finger stick.  She reported good understanding of this. The readings are being sent to her phone and the phone app was set up.  Alarms were put in for blood sugar readings: over 250, and below 70.   Patient also reports no difficulty in giving insulin.  We reviewed how to use, store, the insulin pen and treatments for low blood sugars.  She reported good understanding of this and had no final quesitons. She she had no final questions.

## 2021-03-15 ENCOUNTER — Ambulatory Visit (INDEPENDENT_AMBULATORY_CARE_PROVIDER_SITE_OTHER): Payer: PPO | Admitting: Neurology

## 2021-03-15 ENCOUNTER — Other Ambulatory Visit: Payer: Self-pay

## 2021-03-15 DIAGNOSIS — F3112 Bipolar disorder, current episode manic without psychotic features, moderate: Secondary | ICD-10-CM

## 2021-03-15 DIAGNOSIS — R0683 Snoring: Secondary | ICD-10-CM

## 2021-03-15 DIAGNOSIS — G4733 Obstructive sleep apnea (adult) (pediatric): Secondary | ICD-10-CM | POA: Diagnosis not present

## 2021-03-15 DIAGNOSIS — F5105 Insomnia due to other mental disorder: Secondary | ICD-10-CM

## 2021-03-15 DIAGNOSIS — G4759 Other parasomnia: Secondary | ICD-10-CM

## 2021-03-15 DIAGNOSIS — F99 Mental disorder, not otherwise specified: Secondary | ICD-10-CM

## 2021-03-16 ENCOUNTER — Telehealth: Payer: Self-pay

## 2021-03-16 NOTE — Telephone Encounter (Signed)
Pt states that [facility] has raised out a number of times for orders and chart notes for pt to continue getting continuous  glucose monitoring. This matter needs immediate attention as monitoring will stop in 10 days.  [facility] number [phone]

## 2021-03-17 NOTE — Telephone Encounter (Signed)
I have spoke to Beatty at [facility]. I have provided the [facility] fax number as we have not received any request for chart notes or orders. She stated they will send the request over soon.

## 2021-03-17 NOTE — Telephone Encounter (Signed)
Forms have been received and given to PCP for review and signature.

## 2021-03-18 NOTE — Telephone Encounter (Signed)
Forms has been completed and returned a/w 12/15 OV note

## 2021-03-24 DIAGNOSIS — F3161 Bipolar disorder, current episode mixed, mild: Secondary | ICD-10-CM | POA: Diagnosis not present

## 2021-03-24 DIAGNOSIS — F419 Anxiety disorder, unspecified: Secondary | ICD-10-CM | POA: Diagnosis not present

## 2021-03-25 NOTE — Procedures (Signed)
PATIENT'S NAME:  Syla, Devoss DOB:      03/22/1960      MR#:    [id number]     DATE OF RECORDING: 03/15/2021 [facility]. REFERRING M.D.:  Rachell Cipro, MD Study Performed:   Baseline Polysomnogram HISTORY:  Laneka Mcgrory is a 61 year old Caucasian female patient and was seen here upon a sleep Consultation on 01/28/2021 from Dr. Ival Bible office, [place].  Chief concern according to patient: " I was asked to see a neurologist for a sleep study"   Shepardson Macil Crady has a past medical history of Bipolar 1 disorder ( age 33 -[place]), psychotic breakthrough, medication induced weight gain ( Depakote) , postpartum depression ,Diabetes mellitus without complication ([place]), Hyperlipidemia, Hypertension, Lithium medication, and PTSD (post-traumatic stress disorder).   The patient has chronic insomnia and cyclic, too. Episodic insomnia changes related to mania or depression. 3-4 weeks ago last anxiety augmented and she needs more gabapentin.  Sleep relevant medical history: Snoring, Nocturia, Sleep walking, Hip pain, fallen out of bed from fighting or running away in her dreams.  The patient endorsed the Epworth Sleepiness Scale at 5 points.   The patient's weight 179 pounds with a height of 62 (inches), resulting in a BMI of 32.9 kg/m2. The patient's neck circumference measured 16 inches.  CURRENT MEDICATIONS: Lipitor, Tegretol, Prozac, Neurontin, Synthroid, Zestril, Antivert, Zofran ODT, Seroquel, Lantus   PROCEDURE:  This is a multichannel digital polysomnogram utilizing the Somnostar 11.2 system.  Electrodes and sensors were applied and monitored per AASM Specifications.   EEG, EOG, Chin and Limb EMG, were sampled at 200 Hz.  ECG, Snore and Nasal Pressure, Thermal Airflow, Respiratory Effort, CPAP Flow and Pressure, Oximetry was sampled at 50 Hz. Digital video and audio were recorded.      BASELINE STUDY: Lights Out was at 20:52 and Lights On at 05:18.  Total recording time (TRT) was 507  minutes, with a total sleep time (TST) of 417 minutes.   The patient's sleep latency was 31.5 minutes.  REM latency was 116 minutes.  The sleep efficiency was 82.2 %.     SLEEP ARCHITECTURE: WASO (Wake after sleep onset) was 72 minutes.  There were 23.5 minutes in Stage N1, 246 minutes Stage N2, 69 minutes Stage N3 and 78.5 minutes in Stage REM.  The percentage of Stage N1 was 5.6%, Stage N2 was 59.%, Stage N3 was 16.5% and Stage R (REM sleep) was 18.8%.   RESPIRATORY ANALYSIS:  There were a total of 35 respiratory events:  2 obstructive apneas, 0 central apneas and 0 mixed apneas with a total of 2 apneas and an apnea index (AI) of .3 /hour. There were 33 hypopneas with a hypopnea index of 4.7 /hour  The total APNEA/HYPOPNEA INDEX (AHI) was 5/h. 20 events occurred in REM sleep and 28 events in NREM. The REM AHI was 15.3 /hour, versus a non-REM AHI of 2.7. The patient spent all 417 minutes of total sleep time in the supine position and 0 minutes in non-supine.  The supine AHI was 5.0/h versus a non-supine AHI of 0.0.  OXYGEN SATURATION & C02:  The Wake baseline 02 saturation was 94%, with the lowest being 84%. Time spent below 89% saturation equaled 7 minutes.   PERIODIC LIMB MOVEMENTS:  The patient had a total of 0 Periodic Limb Movements. The arousals were noted as: 45 were spontaneous, 0 were associated with PLMs, 2 were associated with respiratory events. Audio and video analysis did not show any abnormal or unusual  movements, behaviors, phonations or vocalizations.   Mild Snoring was noted. EKG was in keeping with normal sinus rhythm (NSR).  IMPRESSION:  Very, very Obstructive Sleep Apnea (OSA) which only exceeded the cut off AHI at 5/h during REM sleep.  NO Periodic Limb Movement Disorder (PLMD) and no movements during REM sleep.  Mild Snoring Non-specific abnormal EKG.  RECOMMENDATIONS:  I can offer a CPAP trial, but CPAP is optional.  No evidence of parasomnia, REM BD.  I certify that  I have reviewed the entire raw data recording prior to the issuance of this report in accordance with the Standards of Accreditation of the American Academy of Sleep Medicine (AASM)   Larey Seat, MD Diplomat, American Board of Psychiatry and Neurology  Diplomat, American Board of Sleep Medicine Market researcher, [facility] at [employer]

## 2021-03-25 NOTE — Progress Notes (Signed)
IMPRESSION:  1. Very, very Obstructive Sleep Apnea (OSA) which only exceeded the cut off AHI at 5/h during REM sleep.  2. NO Periodic Limb Movement Disorder (PLMD) and no movements during REM sleep.  3. Mild Snoring 4. Non-specific abnormal EKG.  RECOMMENDATIONS:  I can offer a CPAP trial, but CPAP is optional.  No evidence of parasomnia, REM BD.  I certify that I have reviewed the entire raw data recording prior to the issuance of this report in accordance with the Standards of Accreditation of the [place] Academy of Sleep Medicine (AASM)

## 2021-03-26 ENCOUNTER — Other Ambulatory Visit: Payer: Self-pay

## 2021-03-26 ENCOUNTER — Ambulatory Visit: Payer: PPO

## 2021-03-26 ENCOUNTER — Other Ambulatory Visit: Payer: Self-pay | Admitting: Internal Medicine

## 2021-03-26 ENCOUNTER — Telehealth: Payer: Self-pay | Admitting: Neurology

## 2021-03-26 DIAGNOSIS — E119 Type 2 diabetes mellitus without complications: Secondary | ICD-10-CM

## 2021-03-26 MED ORDER — FREESTYLE LIBRE 2 READER DEVI: 1.0000 | Freq: Every day | 5 refills | Status: DC

## 2021-03-26 MED ORDER — FREESTYLE LIBRE 2 SENSOR MISC: 1.0000 | Freq: Every day | 5 refills | Status: DC

## 2021-03-26 NOTE — Telephone Encounter (Signed)
-----   Message from Larey Seat, MD sent at 03/25/2021  5:47 PM EST ----- IMPRESSION:  1. Very, very Obstructive Sleep Apnea (OSA) which only exceeded the cut off AHI at 5/h during REM sleep.  2. NO Periodic Limb Movement Disorder (PLMD) and no movements during REM sleep.  3. Mild Snoring 4. Non-specific abnormal EKG.  RECOMMENDATIONS:  I can offer a CPAP trial, but CPAP is optional.  No evidence of parasomnia, REM BD.  I certify that I have reviewed the entire raw data recording prior to the issuance of this report in accordance with the Standards of Accreditation of the [place] [employer] of [employer] Medicine (AASM)

## 2021-03-26 NOTE — Telephone Encounter (Signed)
Patient states her insurance company will cover the cost of any one touch monitor  Patient states insurance will not cover cost of the Dexcom monitor and would like to request the freestyle [place]

## 2021-03-26 NOTE — Telephone Encounter (Signed)
Called the pt and reviewed the sleep study results. Advised that overall the study was in very mild range/borderline. Advised that the other findings. Pt had no questions and verbalized understanding. Informed based off this study there was a good sleep time. Advised if insomnia remains concern she would need to discuss with pcp about cognitive behavior therapy for insomnia treatment. Pt verbalized understanding.

## 2021-03-27 ENCOUNTER — Other Ambulatory Visit: Payer: Self-pay | Admitting: Internal Medicine

## 2021-03-27 DIAGNOSIS — E785 Hyperlipidemia, unspecified: Secondary | ICD-10-CM

## 2021-03-27 MED ORDER — ATORVASTATIN CALCIUM 40 MG PO TABS: 40.0000 mg | ORAL_TABLET | Freq: Every day | ORAL | 1 refills | Status: DC

## 2021-03-27 NOTE — Telephone Encounter (Signed)
Pharmacy called stating directions onContinuous Blood Gluc Receiver (FREESTYLE LIBRE 2 READER) DEVI  states to reapply once a day  Pharmacy requesting verbals to direct patient to reapply every 14 days, Verdis Frederickson clarified w/ pharmacy and agreed

## 2021-03-27 NOTE — Progress Notes (Signed)
Pt was in office 03/26/21 to have a hepatitis A vaccine per Dr. Ronnald Ramp.

## 2021-04-07 DIAGNOSIS — F3161 Bipolar disorder, current episode mixed, mild: Secondary | ICD-10-CM | POA: Diagnosis not present

## 2021-04-14 ENCOUNTER — Other Ambulatory Visit: Payer: Self-pay | Admitting: Internal Medicine

## 2021-04-28 DIAGNOSIS — F419 Anxiety disorder, unspecified: Secondary | ICD-10-CM | POA: Diagnosis not present

## 2021-04-28 DIAGNOSIS — F3161 Bipolar disorder, current episode mixed, mild: Secondary | ICD-10-CM | POA: Diagnosis not present

## 2021-05-01 ENCOUNTER — Other Ambulatory Visit: Payer: Self-pay | Admitting: Internal Medicine

## 2021-05-05 DIAGNOSIS — F439 Reaction to severe stress, unspecified: Secondary | ICD-10-CM | POA: Diagnosis not present

## 2021-05-13 ENCOUNTER — Other Ambulatory Visit: Payer: Self-pay | Admitting: Internal Medicine

## 2021-05-13 DIAGNOSIS — I10 Essential (primary) hypertension: Secondary | ICD-10-CM

## 2021-05-26 ENCOUNTER — Encounter: Payer: Self-pay | Admitting: Internal Medicine

## 2021-05-26 ENCOUNTER — Ambulatory Visit (INDEPENDENT_AMBULATORY_CARE_PROVIDER_SITE_OTHER): Payer: PPO | Admitting: Internal Medicine

## 2021-05-26 ENCOUNTER — Other Ambulatory Visit: Payer: Self-pay

## 2021-05-26 VITALS — BP 142/78 | HR 81 | Temp 98.5°F | Resp 16 | Ht 62.0 in | Wt 182.0 lb

## 2021-05-26 DIAGNOSIS — I10 Essential (primary) hypertension: Secondary | ICD-10-CM | POA: Diagnosis not present

## 2021-05-26 DIAGNOSIS — E119 Type 2 diabetes mellitus without complications: Secondary | ICD-10-CM

## 2021-05-26 DIAGNOSIS — Z794 Long term (current) use of insulin: Secondary | ICD-10-CM | POA: Diagnosis not present

## 2021-05-26 DIAGNOSIS — E118 Type 2 diabetes mellitus with unspecified complications: Secondary | ICD-10-CM

## 2021-05-26 LAB — BASIC METABOLIC PANEL
BUN: 9 mg/dL (ref 6–23)
CO2: 29 mEq/L (ref 19–32)
Calcium: 9.5 mg/dL (ref 8.4–10.5)
Chloride: 94 mEq/L — ABNORMAL LOW (ref 96–112)
Creatinine, Ser: 0.5 mg/dL (ref 0.40–1.20)
GFR: 103.38 mL/min (ref >60.00)
Glucose, Bld: 119 mg/dL — ABNORMAL HIGH (ref 70–99)
Potassium: 4.1 mEq/L (ref 3.5–5.1)
Sodium: 131 mEq/L — ABNORMAL LOW (ref 135–145)

## 2021-05-26 LAB — URINALYSIS, ROUTINE W REFLEX MICROSCOPIC
Bilirubin Urine: NEGATIVE
Hgb urine dipstick: NEGATIVE
Ketones, ur: NEGATIVE
Nitrite: NEGATIVE
RBC / HPF: NONE SEEN (ref 0–?)
Specific Gravity, Urine: 1.01 (ref 1.000–1.030)
Total Protein, Urine: NEGATIVE
Urine Glucose: NEGATIVE
Urobilinogen, UA: 0.2 (ref 0.0–1.0)
pH: 7 (ref 5.0–8.0)

## 2021-05-26 LAB — MICROALBUMIN / CREATININE URINE RATIO
Creatinine,U: 34.6 mg/dL
Microalb Creat Ratio: 2 mg/g (ref 0.0–30.0)
Microalb, Ur: <0.7 mg/dL (ref 0.0–1.9)

## 2021-05-26 LAB — HEMOGLOBIN A1C: Hgb A1c MFr Bld: 7.1 % — ABNORMAL HIGH (ref 4.6–6.5)

## 2021-05-26 MED ORDER — OLMESARTAN MEDOXOMIL 40 MG PO TABS: 40.0000 mg | ORAL_TABLET | Freq: Every day | ORAL | 1 refills | Status: DC

## 2021-05-26 NOTE — Progress Notes (Signed)
? ?Subjective:  ?Patient ID: Jessica Guzman, female    DOB: [birth year]  Age: 61 y.o. MRN: [id number] ? ?CC: Hypertension and Diabetes ? ?This visit occurred during the SARS-CoV-2 public health emergency.  Safety protocols were in place, including screening questions prior to the visit, additional usage of staff PPE, and extensive cleaning of exam room while observing appropriate contact time as indicated for disinfecting solutions.   ? ?HPI ?Jessica Guzman presents for f/up -  ? ?She exercises and though she is limited by hip pain she does not experience chest pain, shortness of breath, diaphoresis, dizziness, lightheadedness, or edema. ? ?Outpatient Medications Prior to Visit  ?Medication Sig Dispense Refill  ? atorvastatin (LIPITOR) 40 MG tablet Take 1 tablet (40 mg total) by mouth at bedtime. 90 tablet 1  ? B-D UF III MINI PEN NEEDLES 31G X 5 MM MISC USE ONCE DAILY 100 each 1  ? carBAMazepine (TEGRETOL PO) Take 1 tablet by mouth in the morning and at bedtime. Get Filled @ [place], Need to verify Strength    ? Continuous Blood Gluc Receiver (FREESTYLE LIBRE 2 READER) DEVI 1 Act by Does not apply route daily. 2 each 5  ? Continuous Blood Gluc Sensor (FREESTYLE LIBRE 2 SENSOR) MISC 1 Act by Does not apply route daily. 2 each 5  ? FLUoxetine HCl (PROZAC PO) Take 40 mg by mouth daily.    ? gabapentin (NEURONTIN) 300 MG capsule Take 1 capsule by mouth 2 (two) times daily.    ? Glucagon (GVOKE HYPOPEN 2-PACK) 1 MG/0.2ML SOAJ Inject 1 Act into the skin daily as needed. 2 mL 5  ? insulin glargine (LANTUS SOLOSTAR) 100 UNIT/ML Solostar Pen Inject 20 Units into the skin daily. 18 mL 1  ? levothyroxine (SYNTHROID) 25 MCG tablet Take 25 mcg by mouth every morning.    ? QUEtiapine (SEROQUEL) 300 MG tablet Take 300 mg by mouth daily.    ? olmesartan (BENICAR) 20 MG tablet TAKE 1 TABLET(20 MG) BY MOUTH DAILY 90 tablet 0  ? ?No facility-administered medications prior to visit.  ? ? ?ROS ?Review of  Systems  ?Constitutional:  Positive for unexpected weight change (wt gain). Negative for diaphoresis and fatigue.  ?HENT: Negative.    ?Eyes: Negative.   ?Respiratory:  Negative for cough, chest tightness, shortness of breath and wheezing.   ?Cardiovascular:  Negative for chest pain, palpitations and leg swelling.  ?Gastrointestinal:  Negative for abdominal pain, constipation, diarrhea, nausea and vomiting.  ?Endocrine: Negative.   ?Genitourinary: Negative.  Negative for difficulty urinating and dysuria.  ?Musculoskeletal:  Positive for arthralgias. Negative for joint swelling and myalgias.  ?Skin: Negative.   ?Neurological: Negative.  Negative for dizziness, weakness, light-headedness and headaches.  ?Hematological:  Negative for adenopathy. Does not bruise/bleed easily.  ?Psychiatric/Behavioral: Negative.    ? ?Objective:  ?BP (!) 142/78 (BP Location: Left Arm, Patient Position: Sitting, Cuff Size: Large)   Pulse 81   Temp 98.5 ?F (36.9 ?C) (Oral)   Resp 16   Ht '5\' 2"'$  (1.575 m)   Wt 182 lb (82.6 kg)   SpO2 98%   BMI 33.29 kg/m?  ? ?BP Readings from Last 3 Encounters:  ?05/26/21 (!) 142/78  ?02/19/21 (!) 148/82  ?01/28/21 131/73  ? ? ?Wt Readings from Last 3 Encounters:  ?05/26/21 182 lb (82.6 kg)  ?02/19/21 180 lb (81.6 kg)  ?01/28/21 179 lb (81.2 kg)  ? ? ?Physical Exam ?Vitals reviewed.  ?HENT:  ?   Nose: Nose normal.  ?  Mouth/Throat:  ?   Mouth: Mucous membranes are moist.  ?Eyes:  ?   General: No scleral icterus. ?   Conjunctiva/sclera: Conjunctivae normal.  ?Cardiovascular:  ?   Rate and Rhythm: Normal rate and regular rhythm.  ?   Heart sounds: No murmur heard. ?Pulmonary:  ?   Effort: Pulmonary effort is normal.  ?   Breath sounds: No stridor. No wheezing, rhonchi or rales.  ?Abdominal:  ?   General: Abdomen is flat.  ?   Palpations: There is no mass.  ?   Tenderness: There is no abdominal tenderness. There is no guarding.  ?   Hernia: No hernia is present.  ?Musculoskeletal:     ?   General:  Normal range of motion.  ?   Cervical back: Neck supple.  ?   Right lower leg: No edema.  ?   Left lower leg: No edema.  ?Lymphadenopathy:  ?   Cervical: No cervical adenopathy.  ?Skin: ?   General: Skin is warm and dry.  ?Neurological:  ?   General: No focal deficit present.  ?   Mental Status: She is alert.  ?Psychiatric:     ?   Mood and Affect: Mood normal.     ?   Behavior: Behavior normal.  ? ? ?Lab Results  ?Component Value Date  ? WBC 9.3 02/02/2021  ? HGB 12.4 02/02/2021  ? HCT 37 02/02/2021  ? PLT 329 03/09/2020  ? GLUCOSE 119 (H) 05/26/2021  ? CHOL 195 02/02/2021  ? TRIG 152 02/02/2021  ? HDL 51 02/02/2021  ? [place] 114 02/02/2021  ? ALT 22 03/09/2020  ? AST 21 03/09/2020  ? NA 131 (L) 05/26/2021  ? K 4.1 05/26/2021  ? CL 94 (L) 05/26/2021  ? CREATININE 0.50 05/26/2021  ? BUN 9 05/26/2021  ? CO2 29 05/26/2021  ? TSH 1.32 02/02/2021  ? INR 1.0 03/09/2020  ? HGBA1C 7.1 (H) 05/26/2021  ? MICROALBUR <0.7 05/26/2021  ? ? ?MM 3D SCREEN BREAST BILATERAL ? ?Result Date: 09/20/2020 ?CLINICAL DATA:  Screening. EXAM: DIGITAL SCREENING BILATERAL MAMMOGRAM WITH TOMOSYNTHESIS AND CAD TECHNIQUE: Bilateral screening digital craniocaudal and mediolateral oblique mammograms were obtained. Bilateral screening digital breast tomosynthesis was performed. The images were evaluated with computer-aided detection. COMPARISON:  Previous exam(s). ACR Breast Density Category a: The breast tissue is almost entirely fatty. FINDINGS: There are no findings suspicious for malignancy. IMPRESSION: No mammographic evidence of malignancy. A result letter of this screening mammogram will be mailed directly to the patient. RECOMMENDATION: Screening mammogram in one year. (Code:SM-B-01Y) BI-RADS CATEGORY  1: Negative. Electronically Signed   By: Ammie Ferrier M.D.   On: 09/20/2020 12:23  ? ? ?Assessment & Plan:  ? ?Jessica Guzman was seen today for hypertension and diabetes. ? ?Diagnoses and all orders for this visit: ? ?Primary hypertension- She  has not achieved her blood pressure goal of 130/80.  Will increase the dose of the ARB. ?-     Basic metabolic panel; Future ?-     Urinalysis, Routine w reflex microscopic; Future ?-     Urinalysis, Routine w reflex microscopic ?-     Basic metabolic panel ?-     olmesartan (BENICAR) 40 MG tablet; Take 1 tablet (40 mg total) by mouth daily. ? ?Type II diabetes mellitus with complication ([place]) ?-     Microalbumin / creatinine urine ratio; Future ?-     Hemoglobin A1c; Future ?-     Hemoglobin A1c ?-  Microalbumin / creatinine urine ratio ?-     olmesartan (BENICAR) 40 MG tablet; Take 1 tablet (40 mg total) by mouth daily. ? ?Insulin-requiring or dependent type II diabetes mellitus ([place])- Her A1c is at 7.1%.  I recommended that she add metformin to the basal insulin. ?-     Microalbumin / creatinine urine ratio; Future ?-     Hemoglobin A1c; Future ?-     Hemoglobin A1c ?-     Microalbumin / creatinine urine ratio ? ? ?I have discontinued Piedad Standiford. Martus's olmesartan. I am also having her start on olmesartan and metFORMIN. Additionally, I am having her maintain her FLUoxetine HCl (PROZAC PO), carBAMazepine (TEGRETOL PO), gabapentin, QUEtiapine, levothyroxine, Gvoke HypoPen 2-Pack, Lantus SoloStar, FreeStyle Libre 2 Sensor, [employer] 2 Reader, atorvastatin, and B-D UF III MINI PEN NEEDLES. ? ?Meds ordered this encounter  ?Medications  ? olmesartan (BENICAR) 40 MG tablet  ?  Sig: Take 1 tablet (40 mg total) by mouth daily.  ?  Dispense:  90 tablet  ?  Refill:  1  ? metFORMIN (GLUCOPHAGE-XR) 750 MG 24 hr tablet  ?  Sig: Take 1 tablet (750 mg total) by mouth daily with breakfast.  ?  Dispense:  90 tablet  ?  Refill:  1  ? ? ? ?Follow-up: Return in about 6 months (around 11/26/2021). ? ?Scarlette Calico, MD ?

## 2021-05-26 NOTE — Patient Instructions (Signed)

## 2021-05-31 ENCOUNTER — Encounter: Payer: Self-pay | Admitting: Internal Medicine

## 2021-05-31 ENCOUNTER — Other Ambulatory Visit: Payer: Self-pay | Admitting: Internal Medicine

## 2021-05-31 MED ORDER — METFORMIN HCL ER 750 MG PO TB24: 750.0000 mg | ORAL_TABLET | Freq: Every day | ORAL | 1 refills | Status: DC

## 2021-06-01 ENCOUNTER — Other Ambulatory Visit: Payer: Self-pay | Admitting: Internal Medicine

## 2021-06-01 DIAGNOSIS — E118 Type 2 diabetes mellitus with unspecified complications: Secondary | ICD-10-CM

## 2021-06-01 MED ORDER — METFORMIN HCL 1000 MG PO TABS: 1000.0000 mg | ORAL_TABLET | Freq: Two times a day (BID) | ORAL | 1 refills | Status: DC

## 2021-06-02 DIAGNOSIS — F419 Anxiety disorder, unspecified: Secondary | ICD-10-CM | POA: Diagnosis not present

## 2021-06-08 ENCOUNTER — Other Ambulatory Visit: Payer: Self-pay | Admitting: Internal Medicine

## 2021-06-08 ENCOUNTER — Telehealth: Payer: Self-pay | Admitting: Internal Medicine

## 2021-06-08 DIAGNOSIS — E871 Hypo-osmolality and hyponatremia: Secondary | ICD-10-CM | POA: Insufficient documentation

## 2021-06-08 NOTE — Telephone Encounter (Signed)
Please place order in for Sodium Check lab.  ? ?Pt states that she was told to come in.   ?

## 2021-06-08 NOTE — Telephone Encounter (Signed)
Pt has been informed that sodium lab has been ordered. She stated she would come by in 3-4 weeks as instructed by PCP.  ?

## 2021-06-09 DIAGNOSIS — F3161 Bipolar disorder, current episode mixed, mild: Secondary | ICD-10-CM | POA: Diagnosis not present

## 2021-06-09 DIAGNOSIS — F419 Anxiety disorder, unspecified: Secondary | ICD-10-CM | POA: Diagnosis not present

## 2021-06-16 ENCOUNTER — Encounter: Payer: Self-pay | Admitting: Internal Medicine

## 2021-06-16 ENCOUNTER — Other Ambulatory Visit: Payer: Self-pay | Admitting: Internal Medicine

## 2021-06-16 DIAGNOSIS — A6004 Herpesviral vulvovaginitis: Secondary | ICD-10-CM | POA: Insufficient documentation

## 2021-06-16 MED ORDER — VALACYCLOVIR HCL 500 MG PO TABS: 500.0000 mg | ORAL_TABLET | Freq: Two times a day (BID) | ORAL | 1 refills | Status: DC

## 2021-06-29 ENCOUNTER — Other Ambulatory Visit: Payer: Self-pay | Admitting: Internal Medicine

## 2021-06-29 ENCOUNTER — Telehealth: Payer: Self-pay | Admitting: Internal Medicine

## 2021-06-29 ENCOUNTER — Other Ambulatory Visit (INDEPENDENT_AMBULATORY_CARE_PROVIDER_SITE_OTHER): Payer: PPO

## 2021-06-29 DIAGNOSIS — E119 Type 2 diabetes mellitus without complications: Secondary | ICD-10-CM

## 2021-06-29 DIAGNOSIS — E871 Hypo-osmolality and hyponatremia: Secondary | ICD-10-CM

## 2021-06-29 LAB — BASIC METABOLIC PANEL
BUN: 9 mg/dL (ref 6–23)
CO2: 27 mEq/L (ref 19–32)
Calcium: 9.5 mg/dL (ref 8.4–10.5)
Chloride: 95 mEq/L — ABNORMAL LOW (ref 96–112)
Creatinine, Ser: 0.54 mg/dL (ref 0.40–1.20)
GFR: 101.41 mL/min (ref >60.00)
Glucose, Bld: 126 mg/dL — ABNORMAL HIGH (ref 70–99)
Potassium: 4.7 mEq/L (ref 3.5–5.1)
Sodium: 131 mEq/L — ABNORMAL LOW (ref 135–145)

## 2021-06-29 MED ORDER — LANTUS SOLOSTAR 100 UNIT/ML ~~LOC~~ SOPN: 20.0000 [IU] | PEN_INJECTOR | Freq: Every day | SUBCUTANEOUS | 1 refills | Status: DC

## 2021-06-29 NOTE — Telephone Encounter (Signed)
PT visits today requesting a refill for their insulin glargine (LANTUS SOLOSTAR) 100 Unit/ML Solostar Pen. PT would like this prescription sent out to the [facility] on file. ? ?CB: [phone] ?

## 2021-06-30 LAB — SODIUM, URINE, RANDOM: Sodium, Ur: 49 mmol/L (ref 28–272)

## 2021-07-02 DIAGNOSIS — F439 Reaction to severe stress, unspecified: Secondary | ICD-10-CM | POA: Diagnosis not present

## 2021-07-14 ENCOUNTER — Encounter: Payer: Self-pay | Admitting: Family Medicine

## 2021-07-14 ENCOUNTER — Ambulatory Visit (INDEPENDENT_AMBULATORY_CARE_PROVIDER_SITE_OTHER): Payer: PPO | Admitting: Family Medicine

## 2021-07-14 VITALS — BP 140/85 | HR 72 | Ht 62.0 in | Wt 175.5 lb

## 2021-07-14 DIAGNOSIS — G4733 Obstructive sleep apnea (adult) (pediatric): Secondary | ICD-10-CM

## 2021-07-14 DIAGNOSIS — F5104 Psychophysiologic insomnia: Secondary | ICD-10-CM | POA: Diagnosis not present

## 2021-07-14 NOTE — Patient Instructions (Signed)
Below is our plan:  We will continue to monitor.   Please make sure you are staying well hydrated. I recommend 50-60 ounces daily. Well balanced diet and regular exercise encouraged. Consistent sleep schedule with 6-8 hours recommended.   Please continue follow up with care team as directed.   Follow up with me as needed   You may receive a survey regarding today's visit. I encourage you to leave honest feed back as I do use this information to improve patient care. Thank you for seeing me today!    

## 2021-07-14 NOTE — Progress Notes (Signed)
? ? ?PATIENT: Jessica Guzman ?DOB: [birth year] ? ?REASON FOR VISIT: follow up ?HISTORY FROM: patient ? ?Chief Complaint  ?Patient presents with  ? Follow-up  ?  Rm 1, alone. Pt reports doing well. Not having fatigue.    ?  ? ?HISTORY OF PRESENT ILLNESS: ? ?07/14/21 ALL:  ?Jessica Guzman is a 61 y.o. female here today for follow up for OSA on CPAP. She was seen in consult with Dr Brett Fairy in 01/2021 for concerns of insomnia and snoring. PCP requested eval for sleep study. PSG 03/15/2021 showed total AHI 5/hr, REM AHI 15.3/hr with no significant hypoxia. CPAP optional. No PLMD or parasomnia noted. She reports declining CPAP orders. She does not feel that she needs to use CPAP. She feels that energy has normalized. She is not having significant fatigue. She reports fatigue improved with vitamin D replacement. She does have significant insomnia and contributes this to her mood. She feels mood is very well managed with psychiatry ([facility]). She is seeing a counselor regularly.  ? ? ?HISTORY: (copied from Dr Dohmeier's previous note) ? ?Jessica Guzman is a 61 year old Caucasian female patient and was seen here upon a sleep Consultation on 01/28/2021 from Dr. Ival Bible office, [place].  ?Chief concern according to patient : " I was asked to see a neurologist for a sleep study" ?  ?Deklyn Trachtenberg  has a past medical history of Bipolar 1 disorder ( age 61 -[place]), psychotic breakthrough, medication induced weight gain ( Depakote) ,  post partum depression ,Diabetes mellitus without complication ([place]), Hyperlipidemia, Hypertension, Lithium medication, and PTSD (post-traumatic stress disorder). ?  ?The patient has chronic insomnia and cyclic, too. Episodic insomnia changes related to mania or depression. 3-4 weeks ago last anxiety augmented and she needs more gabapentin.  ?Sleep relevant medical history: Snoring, Nocturia, Sleep walking, Hip pain, fallen out of bed from fighting  or running away in her  dreams, no Tonsillectomy, nasal septum deviation.  ?  ?Family medical /sleep history: brother and sister on CPAP with OSA, bipolar disorder related insomnia, sleep walkers, obesity in both siblings.   ?  ?Social history:  Patient is disabled, former Medical illustrator, and lives in a household alone with a cat.  ?Family status is divorced , with 2 adult children in [place].  ?  ?Tobacco use: quit 2 weeks ago.  ETOH use ; rare,  ?Caffeine intake in form of Coffee( 2 cups a day) Soda( /) Tea ( /) or energy drinks. ?Regular exercise in form of walking.   ?  ?Sleep habits are as follows: The patient's dinner time is between 5-6 PM. The patient goes to bed at 10-11 PM and continues ( currently for the last 3 weeks ) to sleep for 7-8 hours. Goes to bathroom 1-2 times.  ?The preferred sleep position is supine - due to hip pain-, with the support of 1 pillow.  ?Dreams are reportedly frequent/vivid- and upsetting.  ?7  AM is the usual rise time. When manic, its as early as 5 AM.  The patient wakes up spontaneously at 6.30-  7 AM>.  ?She reports not feeling refreshed or restored in AM, with symptoms such as dry mouth, morning stiffness and aches and residual fatigue. Naps are taken in form of "resting - not sleeping- mind keeps racing.  ? ? ?REVIEW OF SYSTEMS: Out of a complete 14 system review of symptoms, the patient complains only of the following symptoms, insomnia and all other reviewed systems are negative. ? ?  ESS: not completed  ? ?ALLERGIES: ?Allergies  ?Allergen Reactions  ? Codeine Nausea And Vomiting  ? ? ?HOME MEDICATIONS: ?Outpatient Medications Prior to Visit  ?Medication Sig Dispense Refill  ? atorvastatin (LIPITOR) 40 MG tablet Take 1 tablet (40 mg total) by mouth at bedtime. 90 tablet 1  ? B-D UF III MINI PEN NEEDLES 31G X 5 MM MISC USE ONCE DAILY 100 each 1  ? carbamazepine (TEGRETOL) 200 MG tablet Take 1 tablet by mouth 3 (three) times daily. AM, noon, and Bedtime    ? Continuous Blood Gluc Receiver (FREESTYLE  LIBRE 2 READER) DEVI 1 Act by Does not apply route daily. 2 each 5  ? Continuous Blood Gluc Sensor (FREESTYLE LIBRE 2 SENSOR) MISC 1 Act by Does not apply route daily. 2 each 5  ? Glucagon (GVOKE HYPOPEN 2-PACK) 1 MG/0.2ML SOAJ Inject 1 Act into the skin daily as needed. 2 mL 5  ? insulin glargine (LANTUS SOLOSTAR) 100 UNIT/ML Solostar Pen Inject 20 Units into the skin daily. 18 mL 1  ? levothyroxine (SYNTHROID) 25 MCG tablet Take 25 mcg by mouth every morning.    ? metFORMIN (GLUCOPHAGE) 1000 MG tablet Take 1 tablet (1,000 mg total) by mouth 2 (two) times daily with a meal. 180 tablet 1  ? olmesartan (BENICAR) 40 MG tablet Take 1 tablet (40 mg total) by mouth daily. 90 tablet 1  ? QUEtiapine (SEROQUEL) 300 MG tablet Take 300 mg by mouth daily.    ? valACYclovir (VALTREX) 500 MG tablet Take 1 tablet (500 mg total) by mouth 2 (two) times daily. 90 tablet 1  ? carBAMazepine (TEGRETOL PO) Take 1 tablet by mouth in the morning and at bedtime. Get Filled @ [place], Need to verify Strength    ? FLUoxetine HCl (PROZAC PO) Take 40 mg by mouth daily.    ? gabapentin (NEURONTIN) 300 MG capsule Take 1 capsule by mouth 2 (two) times daily.    ? ?No facility-administered medications prior to visit.  ? ? ?PAST MEDICAL HISTORY: ?Past Medical History:  ?Diagnosis Date  ? Bipolar 1 disorder ([place])   ? Diabetes mellitus without complication ([place])   ? Hyperlipidemia   ? Hypertension   ? PTSD (post-traumatic stress disorder)   ? ? ?PAST SURGICAL HISTORY: ?Past Surgical History:  ?Procedure Laterality Date  ? PCOS    ? TUBAL LIGATION    ? ? ?FAMILY HISTORY: ?Family History  ?Problem Relation Age of Onset  ? Alzheimer's disease Mother   ? Diabetes Sister   ? High Cholesterol Sister   ? High blood pressure Sister   ? Bipolar disorder Brother   ? Stroke Maternal Grandfather   ? Diabetes Maternal Grandfather   ? Pancreatic cancer Paternal Grandfather   ? ? ?SOCIAL HISTORY: ?Social History  ? ?Socioeconomic History  ? Marital  status: Divorced  ?  Spouse name: Not on file  ? Number of children: Not on file  ? Years of education: Not on file  ? Highest education level: Bachelor's degree (e.g., BA, AB, BS)  ?Occupational History  ? Not on file  ?Tobacco Use  ? Smoking status: Some Days  ?  Types: Cigarettes  ?  Last attempt to quit: 02/01/2019  ?  Years since quitting: 2.4  ? Smokeless tobacco: Never  ?Vaping Use  ? Vaping Use: Never used  ?Substance and Sexual Activity  ? Alcohol use: Not Currently  ?  Comment: rarely  ? Drug use: Never  ? Sexual activity: Not  Currently  ?Other Topics Concern  ? Not on file  ?Social History Narrative  ? Lives alone  ? Right handed  ? Caffeine: 2-4 cups of coffee  ? ?Social Determinants of Health  ? ?Financial Resource Strain: Not on file  ?Food Insecurity: Not on file  ?Transportation Needs: Not on file  ?Physical Activity: Not on file  ?Stress: Not on file  ?Social Connections: Not on file  ?Intimate Partner Violence: Not on file  ? ? ? ?PHYSICAL EXAM ? ?Vitals:  ? 07/14/21 1014  ?BP: 140/85  ?Pulse: 72  ?Weight: 175 lb 8 oz (79.6 kg)  ?Height: '5\' 2"'$  (1.575 m)  ? ?Body mass index is 32.1 kg/m?. ? ?Generalized: Well developed, in no acute distress  ?Cardiology: normal rate and rhythm, no murmur noted ?Respiratory: clear to auscultation bilaterally  ?Neurological examination  ?Mentation: Alert oriented to time, place, history taking. Follows all commands speech and language fluent ?Cranial nerve II-XII: Pupils were equal round reactive to light. Extraocular movements were full, visual field were full  ?Motor: The motor testing reveals 5 over 5 strength of all 4 extremities. Good symmetric motor tone is noted throughout.  ?Gait and station: Gait is normal.  ? ? ?DIAGNOSTIC DATA (LABS, IMAGING, TESTING) ?- I reviewed patient records, labs, notes, testing and imaging myself where available. ? ?   ? View : No data to display.  ?  ?  ?  ?  ? ?Lab Results  ?Component Value Date  ? WBC 9.3 02/02/2021  ? HGB 12.4  02/02/2021  ? HCT 37 02/02/2021  ? MCV 81.7 03/09/2020  ? PLT 329 03/09/2020  ? ?   ?Component Value Date/Time  ? NA 131 (L) 06/29/2021 0853  ? NA 136 (A) 02/02/2021 0000  ? K 4.7 06/29/2021 0853  ? CL 95 (L) 04

## 2021-07-17 ENCOUNTER — Encounter: Payer: Self-pay | Admitting: Internal Medicine

## 2021-07-21 DIAGNOSIS — F3161 Bipolar disorder, current episode mixed, mild: Secondary | ICD-10-CM | POA: Diagnosis not present

## 2021-07-22 DIAGNOSIS — I10 Essential (primary) hypertension: Secondary | ICD-10-CM | POA: Diagnosis not present

## 2021-07-22 DIAGNOSIS — H35033 Hypertensive retinopathy, bilateral: Secondary | ICD-10-CM | POA: Diagnosis not present

## 2021-07-22 DIAGNOSIS — E119 Type 2 diabetes mellitus without complications: Secondary | ICD-10-CM | POA: Diagnosis not present

## 2021-07-22 DIAGNOSIS — H524 Presbyopia: Secondary | ICD-10-CM | POA: Diagnosis not present

## 2021-07-22 DIAGNOSIS — H35363 Drusen (degenerative) of macula, bilateral: Secondary | ICD-10-CM | POA: Diagnosis not present

## 2021-07-22 LAB — HM DIABETES EYE EXAM

## 2021-08-06 ENCOUNTER — Ambulatory Visit (INDEPENDENT_AMBULATORY_CARE_PROVIDER_SITE_OTHER): Payer: PPO

## 2021-08-06 ENCOUNTER — Other Ambulatory Visit: Payer: Self-pay | Admitting: Internal Medicine

## 2021-08-06 ENCOUNTER — Encounter: Payer: Self-pay | Admitting: Internal Medicine

## 2021-08-06 ENCOUNTER — Ambulatory Visit (INDEPENDENT_AMBULATORY_CARE_PROVIDER_SITE_OTHER): Payer: PPO | Admitting: Internal Medicine

## 2021-08-06 VITALS — BP 136/76 | HR 71 | Temp 98.5°F | Resp 16 | Ht 62.0 in | Wt 169.8 lb

## 2021-08-06 DIAGNOSIS — M1611 Unilateral primary osteoarthritis, right hip: Secondary | ICD-10-CM

## 2021-08-06 DIAGNOSIS — R011 Cardiac murmur, unspecified: Secondary | ICD-10-CM

## 2021-08-06 DIAGNOSIS — G8929 Other chronic pain: Secondary | ICD-10-CM

## 2021-08-06 DIAGNOSIS — M25551 Pain in right hip: Secondary | ICD-10-CM

## 2021-08-06 DIAGNOSIS — I10 Essential (primary) hypertension: Secondary | ICD-10-CM | POA: Diagnosis not present

## 2021-08-06 DIAGNOSIS — F439 Reaction to severe stress, unspecified: Secondary | ICD-10-CM | POA: Diagnosis not present

## 2021-08-06 DIAGNOSIS — R5382 Chronic fatigue, unspecified: Secondary | ICD-10-CM

## 2021-08-06 LAB — CBC WITH DIFFERENTIAL/PLATELET
Basophils Absolute: 0 10*3/uL (ref 0.0–0.1)
Basophils Relative: 0.4 % (ref 0.0–3.0)
Eosinophils Absolute: 0.3 10*3/uL (ref 0.0–0.7)
Eosinophils Relative: 2.6 % (ref 0.0–5.0)
HCT: 40.2 % (ref 36.0–46.0)
Hemoglobin: 13.4 g/dL (ref 12.0–15.0)
Lymphocytes Relative: 23.7 % (ref 12.0–46.0)
Lymphs Abs: 2.6 10*3/uL (ref 0.7–4.0)
MCHC: 33.3 g/dL (ref 30.0–36.0)
MCV: 83.2 fl (ref 78.0–100.0)
Monocytes Absolute: 0.6 10*3/uL (ref 0.1–1.0)
Monocytes Relative: 5.1 % (ref 3.0–12.0)
Neutro Abs: 7.4 10*3/uL (ref 1.4–7.7)
Neutrophils Relative %: 68.2 % (ref 43.0–77.0)
Platelets: 321 10*3/uL (ref 150.0–400.0)
RBC: 4.84 Mil/uL (ref 3.87–5.11)
RDW: 15.7 % — ABNORMAL HIGH (ref 11.5–15.5)
WBC: 10.9 10*3/uL — ABNORMAL HIGH (ref 4.0–10.5)

## 2021-08-06 LAB — BASIC METABOLIC PANEL
BUN: 10 mg/dL (ref 6–23)
CO2: 30 mEq/L (ref 19–32)
Calcium: 10.3 mg/dL (ref 8.4–10.5)
Chloride: 97 mEq/L (ref 96–112)
Creatinine, Ser: 0.6 mg/dL (ref 0.40–1.20)
GFR: 98.8 mL/min (ref >60.00)
Glucose, Bld: 116 mg/dL — ABNORMAL HIGH (ref 70–99)
Potassium: 4.3 mEq/L (ref 3.5–5.1)
Sodium: 137 mEq/L (ref 135–145)

## 2021-08-06 LAB — TSH: TSH: 0.76 u[IU]/mL (ref 0.35–5.50)

## 2021-08-06 LAB — CORTISOL: Cortisol, Plasma: 10.6 ug/dL

## 2021-08-06 LAB — TROPONIN I (HIGH SENSITIVITY): High Sens Troponin I: 5 ng/L (ref 2–17)

## 2021-08-06 NOTE — Progress Notes (Signed)
Subjective:  Patient ID: Jessica Guzman, female    DOB: [birth year]  Age: 62 y.o. MRN: [id number]  CC: Hypertension, Diabetes, and Hip Pain   HPI Jessica Guzman presents for f/up -  She complains of chronic, worsening right hip pain. No recent T/I. She complains of a one month hx of fatigue. She is active and denies CP, DOE, edema.  Outpatient Medications Prior to Visit  Medication Sig Dispense Refill   atorvastatin (LIPITOR) 40 MG tablet Take 1 tablet (40 mg total) by mouth at bedtime. 90 tablet 1   B-D UF III MINI PEN NEEDLES 31G X 5 MM MISC USE ONCE DAILY 100 each 1   carbamazepine (TEGRETOL) 200 MG tablet Take 1 tablet by mouth 3 (three) times daily. AM, noon, and Bedtime     Continuous Blood Gluc Receiver (FREESTYLE LIBRE 2 READER) DEVI 1 Act by Does not apply route daily. 2 each 5   Continuous Blood Gluc Sensor (FREESTYLE LIBRE 2 SENSOR) MISC 1 Act by Does not apply route daily. 2 each 5   insulin glargine (LANTUS SOLOSTAR) 100 UNIT/ML Solostar Pen Inject 20 Units into the skin daily. 18 mL 1   levothyroxine (SYNTHROID) 25 MCG tablet Take 25 mcg by mouth every morning.     metFORMIN (GLUCOPHAGE) 1000 MG tablet Take 1 tablet (1,000 mg total) by mouth 2 (two) times daily with a meal. 180 tablet 1   olmesartan (BENICAR) 40 MG tablet Take 1 tablet (40 mg total) by mouth daily. 90 tablet 1   QUEtiapine (SEROQUEL) 300 MG tablet Take 300 mg by mouth daily.     valACYclovir (VALTREX) 500 MG tablet Take 1 tablet (500 mg total) by mouth 2 (two) times daily. 90 tablet 1   Glucagon (GVOKE HYPOPEN 2-PACK) 1 MG/0.2ML SOAJ Inject 1 Act into the skin daily as needed. 2 mL 5   No facility-administered medications prior to visit.    ROS Review of Systems  Constitutional:  Positive for fatigue. Negative for appetite change, chills, diaphoresis, fever and unexpected weight change.  HENT: Negative.    Eyes: Negative.   Respiratory:  Negative for cough, chest tightness, shortness of  breath and wheezing.   Cardiovascular:  Negative for chest pain, palpitations and leg swelling.  Gastrointestinal:  Negative for abdominal pain, diarrhea, nausea and vomiting.  Endocrine: Negative.   Genitourinary: Negative.  Negative for difficulty urinating.  Musculoskeletal:  Positive for arthralgias. Negative for back pain and myalgias.  Skin: Negative.   Neurological:  Negative for dizziness, weakness, light-headedness and headaches.  Hematological:  Negative for adenopathy. Does not bruise/bleed easily.  Psychiatric/Behavioral: Negative.     Objective:  BP 136/76   Pulse 71   Temp 98.5 F (36.9 C) (Oral)   Resp 16   Ht '5\' 2"'$  (1.575 m)   Wt 169 lb 12.8 oz (77 kg)   SpO2 97%   BMI 31.06 kg/m   BP Readings from Last 3 Encounters:  08/06/21 136/76  07/14/21 140/85  05/26/21 (!) 142/78    Wt Readings from Last 3 Encounters:  08/06/21 169 lb 12.8 oz (77 kg)  07/14/21 175 lb 8 oz (79.6 kg)  05/26/21 182 lb (82.6 kg)    Physical Exam Vitals reviewed.  Constitutional:      Appearance: She is not ill-appearing.  HENT:     Mouth/Throat:     Mouth: Mucous membranes are moist.  Eyes:     General: No scleral icterus.    Conjunctiva/sclera: Conjunctivae normal.  Cardiovascular:  Rate and Rhythm: Normal rate and regular rhythm.     Pulses: Normal pulses.     Heart sounds: No murmur heard.    Comments: EKG- NSR, 71 bpm Normal EKG Pulmonary:     Effort: Pulmonary effort is normal.     Breath sounds: No stridor. No wheezing, rhonchi or rales.  Abdominal:     General: Abdomen is flat.     Palpations: There is no mass.     Tenderness: There is no abdominal tenderness. There is no guarding.     Hernia: No hernia is present.  Musculoskeletal:     Right hip: Normal. No deformity or tenderness. Normal range of motion.     Left hip: Normal. No deformity or tenderness. Normal range of motion.     Right lower leg: No edema.     Left lower leg: No edema.  Skin:     General: Skin is warm and dry.  Neurological:     General: No focal deficit present.     Mental Status: She is alert. Mental status is at baseline.  Psychiatric:        Mood and Affect: Mood normal.        Behavior: Behavior normal.    Lab Results  Component Value Date   WBC 10.9 (H) 08/06/2021   HGB 13.4 08/06/2021   HCT 40.2 08/06/2021   PLT 321.0 08/06/2021   GLUCOSE 116 (H) 08/06/2021   CHOL 195 02/02/2021   TRIG 152 02/02/2021   HDL 51 02/02/2021   LDLCALC 114 02/02/2021   ALT 22 03/09/2020   AST 21 03/09/2020   NA 137 08/06/2021   K 4.3 08/06/2021   CL 97 08/06/2021   CREATININE 0.60 08/06/2021   BUN 10 08/06/2021   CO2 30 08/06/2021   TSH 0.76 08/06/2021   INR 1.0 03/09/2020   HGBA1C 7.1 (H) 05/26/2021   MICROALBUR <0.7 05/26/2021    MM 3D SCREEN BREAST BILATERAL  Result Date: 09/20/2020 CLINICAL DATA:  Screening. EXAM: DIGITAL SCREENING BILATERAL MAMMOGRAM WITH TOMOSYNTHESIS AND CAD TECHNIQUE: Bilateral screening digital craniocaudal and mediolateral oblique mammograms were obtained. Bilateral screening digital breast tomosynthesis was performed. The images were evaluated with computer-aided detection. COMPARISON:  Previous exam(s). ACR Breast Density Category a: The breast tissue is almost entirely fatty. FINDINGS: There are no findings suspicious for malignancy. IMPRESSION: No mammographic evidence of malignancy. A result letter of this screening mammogram will be mailed directly to the patient. RECOMMENDATION: Screening mammogram in one year. (Code:SM-B-01Y) BI-RADS CATEGORY  1: Negative. Electronically Signed   By: Ammie Ferrier M.D.   On: 09/20/2020 12:23   No results found.   Assessment & Plan:   Jessica Guzman was seen today for hypertension, diabetes and hip pain.  Diagnoses and all orders for this visit:  Primary hypertension- Her BP is well controlled. -     Basic metabolic panel; Future -     TSH; Future -     CBC with Differential/Platelet; Future -      Cortisol; Future -     Cortisol -     CBC with Differential/Platelet -     TSH -     Basic metabolic panel  Chronic fatigue- Labs are negative for secondary causes -     Basic metabolic panel; Future -     TSH; Future -     Troponin I (High Sensitivity); Future -     CBC with Differential/Platelet; Future -     Cortisol; Future -  Cortisol -     CBC with Differential/Platelet -     Troponin I (High Sensitivity) -     TSH -     Basic metabolic panel  Chronic hip pain, right- Films show OA. -     Cancel: DG Hip Unilat W OR W/O Pelvis Min 4 Views Right; Future  Murmur, cardiac -     ECHOCARDIOGRAM COMPLETE; Future  Primary osteoarthritis of right hip- She is controlling the pain with OTC remedies.   I have discontinued Jessica Guzman's Gvoke HypoPen 2-Pack. I am also having her maintain her QUEtiapine, levothyroxine, FreeStyle Libre 2 Sensor, [employer] 2 Reader, atorvastatin, B-D UF III MINI PEN NEEDLES, olmesartan, metFORMIN, valACYclovir, Lantus SoloStar, and carbamazepine.  No orders of the defined types were placed in this encounter.    Follow-up: Return in about 3 months (around 11/06/2021).  Scarlette Calico, MD

## 2021-08-06 NOTE — Patient Instructions (Signed)
Hypertension, Adult High blood pressure (hypertension) is when the force of blood pumping through the arteries is too strong. The arteries are the blood vessels that carry blood from the heart throughout the body. Hypertension forces the heart to work harder to pump blood and may cause arteries to become narrow or stiff. Untreated or uncontrolled hypertension can lead to a heart attack, heart failure, a stroke, kidney disease, and other problems. A blood pressure reading consists of a higher number over a lower number. Ideally, your blood pressure should be below 120/80. The first ("top") number is called the systolic pressure. It is a measure of the pressure in your arteries as your heart beats. The second ("bottom") number is called the diastolic pressure. It is a measure of the pressure in your arteries as the heart relaxes. What are the causes? The exact cause of this condition is not known. There are some conditions that result in high blood pressure. What increases the risk? Certain factors may make you more likely to develop high blood pressure. Some of these risk factors are under your control, including: Smoking. Not getting enough exercise or physical activity. Being overweight. Having too much fat, sugar, calories, or salt (sodium) in your diet. Drinking too much alcohol. Other risk factors include: Having a personal history of heart disease, diabetes, high cholesterol, or kidney disease. Stress. Having a family history of high blood pressure and high cholesterol. Having obstructive sleep apnea. Age. The risk increases with age. What are the signs or symptoms? High blood pressure may not cause symptoms. Very high blood pressure (hypertensive crisis) may cause: Headache. Fast or irregular heartbeats (palpitations). Shortness of breath. Nosebleed. Nausea and vomiting. Vision changes. Severe chest pain, dizziness, and seizures. How is this diagnosed? This condition is diagnosed by  measuring your blood pressure while you are seated, with your arm resting on a flat surface, your legs uncrossed, and your feet flat on the floor. The cuff of the blood pressure monitor will be placed directly against the skin of your upper arm at the level of your heart. Blood pressure should be measured at least twice using the same arm. Certain conditions can cause a difference in blood pressure between your right and left arms. If you have a high blood pressure reading during one visit or you have normal blood pressure with other risk factors, you may be asked to: Return on a different day to have your blood pressure checked again. Monitor your blood pressure at home for 1 week or longer. If you are diagnosed with hypertension, you may have other blood or imaging tests to help your health care provider understand your overall risk for other conditions. How is this treated? This condition is treated by making healthy lifestyle changes, such as eating healthy foods, exercising more, and reducing your alcohol intake. You may be referred for counseling on a healthy diet and physical activity. Your health care provider may prescribe medicine if lifestyle changes are not enough to get your blood pressure under control and if: Your systolic blood pressure is above 130. Your diastolic blood pressure is above 80. Your personal target blood pressure may vary depending on your medical conditions, your age, and other factors. Follow these instructions at home: Eating and drinking  Eat a diet that is high in fiber and potassium, and low in sodium, added sugar, and fat. An example of this eating plan is called the DASH diet. DASH stands for Dietary Approaches to Stop Hypertension. To eat this way: Eat   plenty of fresh fruits and vegetables. Try to fill one half of your plate at each meal with fruits and vegetables. Eat whole grains, such as whole-wheat pasta, brown rice, or whole-grain bread. Fill about one  fourth of your plate with whole grains. Eat or drink low-fat dairy products, such as skim milk or low-fat yogurt. Avoid fatty cuts of meat, processed or cured meats, and poultry with skin. Fill about one fourth of your plate with lean proteins, such as fish, chicken without skin, beans, eggs, or tofu. Avoid pre-made and processed foods. These tend to be higher in sodium, added sugar, and fat. Reduce your daily sodium intake. Many people with hypertension should eat less than 1,500 mg of sodium a day. Do not drink alcohol if: Your health care provider tells you not to drink. You are pregnant, may be pregnant, or are planning to become pregnant. If you drink alcohol: Limit how much you have to: 0-1 drink a day for women. 0-2 drinks a day for men. Know how much alcohol is in your drink. In the [place] one drink equals one 12 oz bottle of beer (355 mL), one 5 oz glass of wine (148 mL), or one 1 oz glass of hard liquor (44 mL). Lifestyle  Work with your health care provider to maintain a healthy body weight or to lose weight. Ask what an ideal weight is for you. Get at least 30 minutes of exercise that causes your heart to beat faster (aerobic exercise) most days of the week. Activities may include walking, swimming, or biking. Include exercise to strengthen your muscles (resistance exercise), such as Pilates or lifting weights, as part of your weekly exercise routine. Try to do these types of exercises for 30 minutes at least 3 days a week. Do not use any products that contain nicotine or tobacco. These products include cigarettes, chewing tobacco, and vaping devices, such as e-cigarettes. If you need help quitting, ask your health care provider. Monitor your blood pressure at home as told by your health care provider. Keep all follow-up visits. This is important. Medicines Take over-the-counter and prescription medicines only as told by your health care provider. Follow directions carefully. Blood  pressure medicines must be taken as prescribed. Do not skip doses of blood pressure medicine. Doing this puts you at risk for problems and can make the medicine less effective. Ask your health care provider about side effects or reactions to medicines that you should watch for. Contact a health care provider if you: Think you are having a reaction to a medicine you are taking. Have headaches that keep coming back (recurring). Feel dizzy. Have swelling in your ankles. Have trouble with your vision. Get help right away if you: Develop a severe headache or confusion. Have unusual weakness or numbness. Feel faint. Have severe pain in your chest or abdomen. Vomit repeatedly. Have trouble breathing. These symptoms may be an emergency. Get help right away. Call 911. Do not wait to see if the symptoms will go away. Do not drive yourself to the hospital. Summary Hypertension is when the force of blood pumping through your arteries is too strong. If this condition is not controlled, it may put you at risk for serious complications. Your personal target blood pressure may vary depending on your medical conditions, your age, and other factors. For most people, a normal blood pressure is less than 120/80. Hypertension is treated with lifestyle changes, medicines, or a combination of both. Lifestyle changes include losing weight, eating a healthy,   low-sodium diet, exercising more, and limiting alcohol. This information is not intended to replace advice given to you by your health care provider. Make sure you discuss any questions you have with your health care provider. Document Revised: 12/30/2020 Document Reviewed: 12/30/2020 Elsevier Patient Education  2023 Else[employer] Inc.  

## 2021-08-07 ENCOUNTER — Encounter: Payer: Self-pay | Admitting: Internal Medicine

## 2021-08-08 ENCOUNTER — Encounter: Payer: Self-pay | Admitting: Internal Medicine

## 2021-08-10 DIAGNOSIS — M1611 Unilateral primary osteoarthritis, right hip: Secondary | ICD-10-CM | POA: Insufficient documentation

## 2021-08-11 DIAGNOSIS — F3161 Bipolar disorder, current episode mixed, mild: Secondary | ICD-10-CM | POA: Diagnosis not present

## 2021-08-11 DIAGNOSIS — F419 Anxiety disorder, unspecified: Secondary | ICD-10-CM | POA: Diagnosis not present

## 2021-08-31 ENCOUNTER — Ambulatory Visit (INDEPENDENT_AMBULATORY_CARE_PROVIDER_SITE_OTHER): Payer: PPO

## 2021-08-31 DIAGNOSIS — R011 Cardiac murmur, unspecified: Secondary | ICD-10-CM

## 2021-08-31 LAB — ECHOCARDIOGRAM COMPLETE
Area-P 1/2: 2.54 cm2
S' Lateral: 2.47 cm

## 2021-09-11 ENCOUNTER — Other Ambulatory Visit: Payer: Self-pay | Admitting: Internal Medicine

## 2021-09-11 DIAGNOSIS — E118 Type 2 diabetes mellitus with unspecified complications: Secondary | ICD-10-CM

## 2021-09-15 DIAGNOSIS — F4310 Post-traumatic stress disorder, unspecified: Secondary | ICD-10-CM | POA: Diagnosis not present

## 2021-09-20 ENCOUNTER — Other Ambulatory Visit: Payer: Self-pay | Admitting: Internal Medicine

## 2021-09-20 DIAGNOSIS — E785 Hyperlipidemia, unspecified: Secondary | ICD-10-CM

## 2021-09-23 DIAGNOSIS — E1165 Type 2 diabetes mellitus with hyperglycemia: Secondary | ICD-10-CM | POA: Diagnosis not present

## 2021-09-23 DIAGNOSIS — F419 Anxiety disorder, unspecified: Secondary | ICD-10-CM | POA: Diagnosis not present

## 2021-09-23 DIAGNOSIS — E039 Hypothyroidism, unspecified: Secondary | ICD-10-CM | POA: Diagnosis not present

## 2021-09-23 DIAGNOSIS — E669 Obesity, unspecified: Secondary | ICD-10-CM | POA: Diagnosis not present

## 2021-09-23 DIAGNOSIS — E785 Hyperlipidemia, unspecified: Secondary | ICD-10-CM | POA: Diagnosis not present

## 2021-09-23 DIAGNOSIS — E1142 Type 2 diabetes mellitus with diabetic polyneuropathy: Secondary | ICD-10-CM | POA: Diagnosis not present

## 2021-09-23 DIAGNOSIS — E1169 Type 2 diabetes mellitus with other specified complication: Secondary | ICD-10-CM | POA: Diagnosis not present

## 2021-09-23 DIAGNOSIS — F319 Bipolar disorder, unspecified: Secondary | ICD-10-CM | POA: Diagnosis not present

## 2021-09-23 DIAGNOSIS — G629 Polyneuropathy, unspecified: Secondary | ICD-10-CM | POA: Diagnosis not present

## 2021-09-23 DIAGNOSIS — E038 Other specified hypothyroidism: Secondary | ICD-10-CM | POA: Diagnosis not present

## 2021-09-23 DIAGNOSIS — Z794 Long term (current) use of insulin: Secondary | ICD-10-CM | POA: Diagnosis not present

## 2021-09-23 DIAGNOSIS — G63 Polyneuropathy in diseases classified elsewhere: Secondary | ICD-10-CM | POA: Diagnosis not present

## 2021-09-26 ENCOUNTER — Other Ambulatory Visit: Payer: Self-pay | Admitting: Internal Medicine

## 2021-09-26 DIAGNOSIS — E119 Type 2 diabetes mellitus without complications: Secondary | ICD-10-CM

## 2021-09-28 ENCOUNTER — Encounter: Payer: Self-pay | Admitting: Internal Medicine

## 2021-09-29 DIAGNOSIS — F3161 Bipolar disorder, current episode mixed, mild: Secondary | ICD-10-CM | POA: Diagnosis not present

## 2021-09-29 DIAGNOSIS — F4310 Post-traumatic stress disorder, unspecified: Secondary | ICD-10-CM | POA: Diagnosis not present

## 2021-09-29 DIAGNOSIS — F419 Anxiety disorder, unspecified: Secondary | ICD-10-CM | POA: Diagnosis not present

## 2021-09-30 ENCOUNTER — Ambulatory Visit (HOSPITAL_COMMUNITY)
Admission: EM | Admit: 2021-09-30 | Discharge: 2021-09-30 | Disposition: A | Discharge status: Home / Self Care | Payer: PPO | Attending: Emergency Medicine | Admitting: Emergency Medicine

## 2021-09-30 ENCOUNTER — Encounter (HOSPITAL_COMMUNITY): Payer: Self-pay | Admitting: Emergency Medicine

## 2021-09-30 DIAGNOSIS — R112 Nausea with vomiting, unspecified: Secondary | ICD-10-CM

## 2021-09-30 MED ORDER — ONDANSETRON 4 MG PO TBDP: 4.0000 mg | ORAL_TABLET | Freq: Three times a day (TID) | ORAL | 0 refills | Status: DC | PRN

## 2021-09-30 MED ORDER — ONDANSETRON HCL 4 MG/2ML IJ SOLN
INTRAMUSCULAR | Status: AC
  Filled 2021-09-30: qty 2

## 2021-09-30 MED ORDER — ONDANSETRON HCL 4 MG/2ML IJ SOLN
4.0000 mg | Freq: Once | INTRAMUSCULAR | Status: AC
  Administered 2021-09-30: 4 mg via INTRAMUSCULAR

## 2021-09-30 NOTE — ED Provider Notes (Signed)
[facility]    CSN: [id number] Arrival date & time: 09/30/21  0962      History   Chief Complaint Chief Complaint  Patient presents with   Emesis    HPI Chesley Valls Zwack is a 61 y.o. female.   Patient presents with persistent vomiting beginning around 830 this morning.  Feels weak as she has been unable to tolerate food or liquids since.  Associated soreness to the stomach but denies pain.  No known sick contacts.  Denies fever, chills or body aches, diarrhea, dietary changes, consumption of raw foods or recent travel.  History of diabetes mellitus, endorses blood sugar check today was 145.    Past Medical History:  Diagnosis Date   Bipolar 1 disorder ([place])    Diabetes mellitus without complication ([place])    Hyperlipidemia    Hypertension    PTSD (post-traumatic stress disorder)     Patient Active Problem List   Diagnosis Date Noted   Primary osteoarthritis of right hip 08/10/2021   Murmur, cardiac 08/06/2021   Herpes simplex vulvovaginitis 06/16/2021   Chronic hyponatremia 06/08/2021   Type II diabetes mellitus with complication ([place]) 83/66/2947   Primary hypertension 02/19/2021   Insulin-requiring or dependent type II diabetes mellitus ([place]) 02/19/2021   Insomnia due to other mental disorder 01/28/2021   Snoring 01/28/2021   Other parasomnia 01/28/2021   Screening breast examination 02/15/2019   Bipolar disorder, curr episode manic w/o psychotic features, moderate ([place]) 01/21/2017    Past Surgical History:  Procedure Laterality Date   PCOS     TUBAL LIGATION      OB History     Gravida  4   Para      Term      Preterm      AB  2   Living  2      SAB  2   IAB      Ectopic      Multiple      Live Births  2            Home Medications    Prior to Admission medications   Medication Sig Start Date End Date Taking? Authorizing Provider  atorvastatin (LIPITOR) 40 MG tablet TAKE 1 TABLET(40 MG) BY MOUTH AT BEDTIME 09/20/21    Janith Lima, MD  B-D UF III MINI PEN NEEDLES 31G X 5 MM MISC USE ONCE DAILY 05/01/21   Janith Lima, MD  carbamazepine (TEGRETOL) 200 MG tablet Take 1 tablet by mouth 3 (three) times daily. AM, noon, and Bedtime 07/12/21   [provider]  Continuous Blood Gluc Receiver (FREESTYLE LIBRE 2 READER) DEVI 1 Act by Does not apply route daily. 03/26/21   Janith Lima, MD  Continuous Blood Gluc Sensor (FREESTYLE LIBRE 2 SENSOR) MISC APPLY A NEW SENSOR EVERY 14 DAYS 09/26/21   Janith Lima, MD  insulin glargine (LANTUS SOLOSTAR) 100 UNIT/ML Solostar Pen Inject 20 Units into the skin daily. 06/29/21   Janith Lima, MD  levothyroxine (SYNTHROID) 25 MCG tablet Take 25 mcg by mouth every morning. 12/25/20   [provider]  metFORMIN (GLUCOPHAGE) 1000 MG tablet TAKE 1 TABLET(1000 MG) BY MOUTH TWICE DAILY WITH A MEAL 09/11/21   Janith Lima, MD  olmesartan (BENICAR) 40 MG tablet Take 1 tablet (40 mg total) by mouth daily. 05/26/21   Janith Lima, MD  QUEtiapine (SEROQUEL) 300 MG tablet Take 300 mg by mouth daily. 01/16/21   [provider]  valACYclovir (VALTREX) 500 MG tablet Take 1 tablet (500 mg total) by mouth 2 (two) times daily. 06/16/21   Janith Lima, MD  fluticasone (FLONASE) 50 MCG/ACT nasal spray Place 2 sprays daily into both nostrils. Patient not taking: Reported on 03/10/2020  03/10/20  [provider]  omega-3 acid ethyl esters (LOVAZA) 1 g capsule Take 1 g by mouth daily. Patient not taking: Reported on 03/10/2020  03/10/20  [provider]  Oxcarbazepine (TRILEPTAL) 300 MG tablet Take 1 tablet (300 mg total) 2 (two) times daily by mouth. Patient not taking: Reported on 02/09/2018 01/22/17 03/10/20  Patrecia Pour, NP  PARoxetine (PAXIL) 20 MG tablet Take 20 mg by mouth daily.  Patient not taking: Reported on 03/10/2020  03/10/20  [provider]  prochlorperazine (COMPAZINE) 10 MG tablet Take 1 tablet (10 mg total) by mouth 2 (two) times  daily as needed for nausea (or headache). Patient not taking: Reported on 03/10/2020 05/14/18 03/10/20  Duffy Bruce, MD    Family History Family History  Problem Relation Age of Onset   Alzheimer's disease Mother    Diabetes Sister    High Cholesterol Sister    High blood pressure Sister    Bipolar disorder Brother    Stroke Maternal Grandfather    Diabetes Maternal Grandfather    Pancreatic cancer Paternal Grandfather     Social History Social History   Tobacco Use   Smoking status: Some Days    Types: Cigarettes    Last attempt to quit: 02/01/2019    Years since quitting: 2.6   Smokeless tobacco: Never  Vaping Use   Vaping Use: Never used  Substance Use Topics   Alcohol use: Not Currently    Comment: rarely   Drug use: Never     Allergies   Codeine   Review of Systems Review of Systems  Constitutional: Negative.   Respiratory: Negative.    Cardiovascular: Negative.   Gastrointestinal:  Positive for vomiting. Negative for abdominal distention, abdominal pain, anal bleeding, blood in stool, constipation, diarrhea, nausea and rectal pain.  Skin: Negative.   Neurological: Negative.      Physical Exam Triage Vital Signs ED Triage Vitals  Enc Vitals Group     BP 09/30/21 1837 131/81     Pulse Rate 09/30/21 1837 78     Resp 09/30/21 1837 20     Temp 09/30/21 1837 98.1 F (36.7 C)     Temp Source 09/30/21 1837 Oral     SpO2 09/30/21 1837 95 %     Weight --      Height --      Head Circumference --      Peak Flow --      Pain Score 09/30/21 1836 0     Pain Loc --      Pain Edu? --      Excl. in [place]? --    No data found.  Updated Vital Signs BP 131/81 (BP Location: Right Arm)   Pulse 78   Temp 98.1 F (36.7 C) (Oral)   Resp 20   SpO2 95%   Visual Acuity Right Eye Distance:   Left Eye Distance:   Bilateral Distance:    Right Eye Near:   Left Eye Near:    Bilateral Near:     Physical Exam Constitutional:      Appearance: Normal appearance.   HENT:     Head: Normocephalic.  Eyes:     Extraocular Movements: Extraocular movements  intact.  Pulmonary:     Effort: Pulmonary effort is normal.  Abdominal:     General: Abdomen is flat. Bowel sounds are normal. There is no distension.     Palpations: Abdomen is soft.     Tenderness: There is no abdominal tenderness. There is no guarding.  Skin:    General: Skin is warm and dry.  Neurological:     Mental Status: She is alert and oriented to person, place, and time. Mental status is at baseline.  Psychiatric:        Mood and Affect: Mood normal.        Behavior: Behavior normal.      UC Treatments / Results  Labs (all labs ordered are listed, but only abnormal results are displayed) Labs Reviewed - No data to display  EKG   Radiology No results found.  Procedures Procedures (including critical care time)  Medications Ordered in UC Medications - No data to display  Initial Impression / Assessment and Plan / UC Course  I have reviewed the triage vital signs and the nursing notes.  Pertinent labs & imaging results that were available during my care of the patient were reviewed by me and considered in my medical decision making (see chart for details).  Nausea and vomiting  Vital signs are stable and well ill-appearing patient is in no signs of distress, 4 mg of Zofran given in office along with a couple water and patient is asked to sit up slowly, on reevaluation after 15 to 20 minutes patient has been able to keep water down, Zofran prescribed for outpatient management and advised increase fluid intake until able to tolerate foods, once advancing diet advised bland foods to prevent stomach irritation, may follow-up with urgent care as needed if symptoms persist or worsen Final Clinical Impressions(s) / UC Diagnoses   Final diagnoses:  None   Discharge Instructions   None    ED Prescriptions   None    PDMP not reviewed this encounter.   Hans Eden,  NP 09/30/21 1941

## 2021-09-30 NOTE — Discharge Instructions (Addendum)
Today you are being treated for vomiting which is most likely being caused by a virus, based on your exam there is no tenderness to your abdomen and your vital signs stables I have a low suspicion that there is more serious organ involvement at this time  You have been given an injection of Zofran here in the office today to help minimize your nausea and vomiting  At home you may use Zofran every 8 hours, place tablet underneath the tongue and allow to dissolve wait at least 30 minutes before attempting to eat or drink  Please increase your fluid intake until able to tolerate foods, use water and electrolyte replacement substances such as Gatorade or similar product to prevent dehydration  If you attempt to eat, please try bland foods, avoid spicy or greasy foods which may cause stomach irritation  You may follow-up with urgent care as needed if symptoms persist or worsen

## 2021-09-30 NOTE — ED Triage Notes (Signed)
Pt reports vomiting since around 630am today. Very weak. Denies diarrhea or pains.

## 2021-10-02 ENCOUNTER — Encounter: Payer: Self-pay | Admitting: Internal Medicine

## 2021-10-06 DIAGNOSIS — F4310 Post-traumatic stress disorder, unspecified: Secondary | ICD-10-CM | POA: Diagnosis not present

## 2021-10-15 DIAGNOSIS — F4310 Post-traumatic stress disorder, unspecified: Secondary | ICD-10-CM | POA: Diagnosis not present

## 2021-10-27 DIAGNOSIS — F4310 Post-traumatic stress disorder, unspecified: Secondary | ICD-10-CM | POA: Diagnosis not present

## 2021-11-24 DIAGNOSIS — F4310 Post-traumatic stress disorder, unspecified: Secondary | ICD-10-CM | POA: Diagnosis not present

## 2021-11-24 DIAGNOSIS — F3161 Bipolar disorder, current episode mixed, mild: Secondary | ICD-10-CM | POA: Diagnosis not present

## 2021-11-26 ENCOUNTER — Ambulatory Visit: Payer: PPO | Admitting: Internal Medicine

## 2021-12-08 ENCOUNTER — Other Ambulatory Visit: Payer: Self-pay | Admitting: Internal Medicine

## 2021-12-08 DIAGNOSIS — E118 Type 2 diabetes mellitus with unspecified complications: Secondary | ICD-10-CM

## 2021-12-16 DIAGNOSIS — F419 Anxiety disorder, unspecified: Secondary | ICD-10-CM | POA: Diagnosis not present

## 2021-12-17 ENCOUNTER — Encounter: Payer: Self-pay | Admitting: Internal Medicine

## 2021-12-17 ENCOUNTER — Ambulatory Visit (INDEPENDENT_AMBULATORY_CARE_PROVIDER_SITE_OTHER): Payer: PPO | Admitting: Internal Medicine

## 2021-12-17 VITALS — BP 138/66 | HR 79 | Temp 98.3°F | Resp 16 | Ht 62.0 in | Wt 168.0 lb

## 2021-12-17 DIAGNOSIS — E119 Type 2 diabetes mellitus without complications: Secondary | ICD-10-CM

## 2021-12-17 DIAGNOSIS — Z1231 Encounter for screening mammogram for malignant neoplasm of breast: Secondary | ICD-10-CM | POA: Insufficient documentation

## 2021-12-17 DIAGNOSIS — I10 Essential (primary) hypertension: Secondary | ICD-10-CM

## 2021-12-17 DIAGNOSIS — Z23 Encounter for immunization: Secondary | ICD-10-CM

## 2021-12-17 DIAGNOSIS — E118 Type 2 diabetes mellitus with unspecified complications: Secondary | ICD-10-CM

## 2021-12-17 DIAGNOSIS — Z0001 Encounter for general adult medical examination with abnormal findings: Secondary | ICD-10-CM | POA: Diagnosis not present

## 2021-12-17 DIAGNOSIS — Z794 Long term (current) use of insulin: Secondary | ICD-10-CM

## 2021-12-17 DIAGNOSIS — Z1159 Encounter for screening for other viral diseases: Secondary | ICD-10-CM | POA: Diagnosis not present

## 2021-12-17 DIAGNOSIS — Z7251 High risk heterosexual behavior: Secondary | ICD-10-CM | POA: Diagnosis not present

## 2021-12-17 LAB — CBC WITH DIFFERENTIAL/PLATELET
Basophils Absolute: 0 10*3/uL (ref 0.0–0.1)
Basophils Relative: 0.4 % (ref 0.0–3.0)
Eosinophils Absolute: 0.2 10*3/uL (ref 0.0–0.7)
Eosinophils Relative: 2.2 % (ref 0.0–5.0)
HCT: 39 % (ref 36.0–46.0)
Hemoglobin: 13.2 g/dL (ref 12.0–15.0)
Lymphocytes Relative: 28.1 % (ref 12.0–46.0)
Lymphs Abs: 2.3 10*3/uL (ref 0.7–4.0)
MCHC: 33.7 g/dL (ref 30.0–36.0)
MCV: 83.5 fl (ref 78.0–100.0)
Monocytes Absolute: 0.6 10*3/uL (ref 0.1–1.0)
Monocytes Relative: 7.3 % (ref 3.0–12.0)
Neutro Abs: 5.1 10*3/uL (ref 1.4–7.7)
Neutrophils Relative %: 62 % (ref 43.0–77.0)
Platelets: 270 10*3/uL (ref 150.0–400.0)
RBC: 4.68 Mil/uL (ref 3.87–5.11)
RDW: 14.2 % (ref 11.5–15.5)
WBC: 8.2 10*3/uL (ref 4.0–10.5)

## 2021-12-17 LAB — BASIC METABOLIC PANEL
BUN: 11 mg/dL (ref 6–23)
CO2: 29 mEq/L (ref 19–32)
Calcium: 9.7 mg/dL (ref 8.4–10.5)
Chloride: 96 mEq/L (ref 96–112)
Creatinine, Ser: 0.55 mg/dL (ref 0.40–1.20)
GFR: 100.63 mL/min (ref >60.00)
Glucose, Bld: 110 mg/dL — ABNORMAL HIGH (ref 70–99)
Potassium: 4.6 mEq/L (ref 3.5–5.1)
Sodium: 131 mEq/L — ABNORMAL LOW (ref 135–145)

## 2021-12-17 LAB — HEMOGLOBIN A1C: Hgb A1c MFr Bld: 6.5 % (ref 4.6–6.5)

## 2021-12-17 NOTE — Progress Notes (Signed)
Subjective:  Patient ID: Jessica Guzman, female    DOB: 1961/02/14  Age: 61 y.o. MRN: [id number]  CC: Annual Exam, Hypertension, Diabetes, and Hyperlipidemia   HPI Lavine Hargrove Kilpatrick presents for a CPX and f/up -   She has had multiple female sexual partners recently and wants to be screened for HIV and syphilis.  She complains of chronic fatigue but walks about 2 to 3 miles a day and denies chest pain, shortness of breath, diaphoresis, or edema.  Outpatient Medications Prior to Visit  Medication Sig Dispense Refill   atorvastatin (LIPITOR) 40 MG tablet TAKE 1 TABLET(40 MG) BY MOUTH AT BEDTIME 90 tablet 1   B-D UF III MINI PEN NEEDLES 31G X 5 MM MISC USE ONCE DAILY 100 each 1   carbamazepine (TEGRETOL) 200 MG tablet Take 1 tablet by mouth 3 (three) times daily. AM, noon, and Bedtime     Continuous Blood Gluc Receiver (FREESTYLE LIBRE 2 READER) DEVI 1 Act by Does not apply route daily. 2 each 5   Continuous Blood Gluc Sensor (FREESTYLE LIBRE 2 SENSOR) MISC APPLY A NEW SENSOR EVERY 14 DAYS 2 each 5   insulin glargine (LANTUS SOLOSTAR) 100 UNIT/ML Solostar Pen Inject 20 Units into the skin daily. 18 mL 1   levothyroxine (SYNTHROID) 25 MCG tablet Take 25 mcg by mouth every morning.     metFORMIN (GLUCOPHAGE) 1000 MG tablet TAKE 1 TABLET(1000 MG) BY MOUTH TWICE DAILY WITH A MEAL 180 tablet 0   olmesartan (BENICAR) 40 MG tablet Take 1 tablet (40 mg total) by mouth daily. 90 tablet 1   ondansetron (ZOFRAN-ODT) 4 MG disintegrating tablet Take 1 tablet (4 mg total) by mouth every 8 (eight) hours as needed for nausea or vomiting. 20 tablet 0   QUEtiapine (SEROQUEL) 300 MG tablet Take 300 mg by mouth daily.     valACYclovir (VALTREX) 500 MG tablet Take 1 tablet (500 mg total) by mouth 2 (two) times daily. 90 tablet 1   fluticasone (FLONASE) 50 MCG/ACT nasal spray Place 2 sprays daily into both nostrils. (Patient not taking: Reported on 03/10/2020)     omega-3 acid ethyl esters (LOVAZA) 1 g  capsule Take 1 g by mouth daily. (Patient not taking: Reported on 03/10/2020)     Oxcarbazepine (TRILEPTAL) 300 MG tablet Take 1 tablet (300 mg total) 2 (two) times daily by mouth. (Patient not taking: Reported on 02/09/2018) 60 tablet 0   PARoxetine (PAXIL) 20 MG tablet Take 20 mg by mouth daily.  (Patient not taking: Reported on 03/10/2020)     prochlorperazine (COMPAZINE) 10 MG tablet Take 1 tablet (10 mg total) by mouth 2 (two) times daily as needed for nausea (or headache). (Patient not taking: Reported on 03/10/2020) 14 tablet 0   No facility-administered medications prior to visit.    ROS Review of Systems  Constitutional:  Negative for appetite change, chills, diaphoresis, fatigue and fever.  HENT: Negative.    Eyes: Negative.   Respiratory:  Negative for cough, chest tightness, shortness of breath and wheezing.   Cardiovascular:  Negative for chest pain, palpitations and leg swelling.  Gastrointestinal:  Negative for abdominal pain, constipation, diarrhea, nausea and vomiting.  Endocrine: Negative.   Genitourinary: Negative.  Negative for difficulty urinating.  Musculoskeletal: Negative.  Negative for myalgias.  Skin: Negative.   Neurological:  Negative for dizziness, weakness, light-headedness and headaches.  Hematological:  Negative for adenopathy. Does not bruise/bleed easily.  Psychiatric/Behavioral: Negative.      Objective:  BP 138/66 (  BP Location: Left Arm, Patient Position: Sitting, Cuff Size: Large)   Pulse 79   Temp 98.3 F (36.8 C) (Oral)   Resp 16   Ht '5\' 2"'$  (1.575 m)   Wt 168 lb (76.2 kg)   SpO2 96%   BMI 30.73 kg/m   BP Readings from Last 3 Encounters:  12/17/21 138/66  09/30/21 131/81  08/06/21 136/76    Wt Readings from Last 3 Encounters:  12/17/21 168 lb (76.2 kg)  08/06/21 169 lb 12.8 oz (77 kg)  07/14/21 175 lb 8 oz (79.6 kg)    Physical Exam Vitals reviewed.  HENT:     Nose: Nose normal.     Mouth/Throat:     Mouth: Mucous membranes are  moist.  Eyes:     General: No scleral icterus.    Conjunctiva/sclera: Conjunctivae normal.  Cardiovascular:     Rate and Rhythm: Normal rate and regular rhythm.     Heart sounds: Murmur heard.     Systolic murmur is present with a grade of 1/6.     No diastolic murmur is present.     No friction rub. No gallop.  Pulmonary:     Effort: Pulmonary effort is normal.     Breath sounds: No stridor. No wheezing, rhonchi or rales.  Abdominal:     General: Abdomen is flat.     Palpations: There is no mass.     Tenderness: There is no abdominal tenderness. There is no guarding.     Hernia: No hernia is present.  Musculoskeletal:        General: Normal range of motion.     Cervical back: Neck supple.     Right lower leg: No edema.     Left lower leg: No edema.  Lymphadenopathy:     Cervical: No cervical adenopathy.  Skin:    General: Skin is warm and dry.  Neurological:     General: No focal deficit present.     Mental Status: She is alert.  Psychiatric:        Mood and Affect: Mood normal.        Behavior: Behavior normal.        Thought Content: Thought content normal.        Judgment: Judgment normal.     Lab Results  Component Value Date   WBC 8.2 12/17/2021   HGB 13.2 12/17/2021   HCT 39.0 12/17/2021   PLT 270.0 12/17/2021   GLUCOSE 110 (H) 12/17/2021   CHOL 195 02/02/2021   TRIG 152 02/02/2021   HDL 51 02/02/2021   LDLCALC 114 02/02/2021   ALT 22 03/09/2020   AST 21 03/09/2020   NA 131 (L) 12/17/2021   K 4.6 12/17/2021   CL 96 12/17/2021   CREATININE 0.55 12/17/2021   BUN 11 12/17/2021   CO2 29 12/17/2021   TSH 0.76 08/06/2021   INR 1.0 03/09/2020   HGBA1C 6.5 12/17/2021   MICROALBUR <0.7 05/26/2021    No results found.  Assessment & Plan:   Shonita was seen today for annual exam, hypertension, diabetes and hyperlipidemia.  Diagnoses and all orders for this visit:  Insulin-requiring or dependent type II diabetes mellitus ([place])- Her blood sugar is well  controlled. -     Basic metabolic panel; Future -     Hemoglobin A1c; Future -     Hemoglobin A1c -     Basic metabolic panel  Type II diabetes mellitus with complication ([facility]) -     Basic  metabolic panel; Future -     Hemoglobin A1c; Future -     HM Diabetes Foot Exam -     Hemoglobin A1c -     Basic metabolic panel  Primary hypertension- Her blood pressure is well controlled. -     Basic metabolic panel; Future -     CBC with Differential/Platelet; Future -     CBC with Differential/Platelet -     Basic metabolic panel  High risk heterosexual behavior -     RPR; Future -     HIV Antibody (routine testing w rflx); Future -     Hepatitis C antibody; Future -     Hepatitis C antibody -     HIV Antibody (routine testing w rflx) -     RPR  Encounter for general adult medical examination with abnormal findings- Exam completed, labs reviewed, vaccines reviewed and updated, cancer screenings addressed, patient education was given.  Visit for screening mammogram -     MM DIGITAL SCREENING BILATERAL; Future  Need for hepatitis C screening test -     Hepatitis C antibody; Future -     Hepatitis C antibody  Other orders -     Pneumococcal polysaccharide vaccine 23-valent greater than or equal to 2yo subcutaneous/IM -     Flu Vaccine QUAD 6+ mos PF IM (Fluarix Quad PF)   I have discontinued Loura Pardon. [place]'s PARoxetine, fluticasone, Oxcarbazepine, omega-3 acid ethyl esters, and prochlorperazine. I am also having her maintain her QUEtiapine, levothyroxine, FreeStyle Libre 2 Reader, B-D UF III MINI PEN NEEDLES, olmesartan, valACYclovir, Lantus SoloStar, carbamazepine, atorvastatin, FreeStyle Libre 2 Sensor, ondansetron, and metFORMIN.  No orders of the defined types were placed in this encounter.    Follow-up: Return in about 6 months (around 06/18/2022).  Scarlette Calico, MD

## 2021-12-17 NOTE — Patient Instructions (Signed)

## 2021-12-18 LAB — RPR: RPR Ser Ql: NONREACTIVE

## 2021-12-18 LAB — HIV ANTIBODY (ROUTINE TESTING W REFLEX): HIV 1&2 Ab, 4th Generation: NONREACTIVE

## 2021-12-18 LAB — HEPATITIS C ANTIBODY: Hepatitis C Ab: NONREACTIVE

## 2021-12-20 ENCOUNTER — Other Ambulatory Visit: Payer: Self-pay | Admitting: Internal Medicine

## 2021-12-20 DIAGNOSIS — E119 Type 2 diabetes mellitus without complications: Secondary | ICD-10-CM

## 2022-01-10 ENCOUNTER — Encounter (INDEPENDENT_AMBULATORY_CARE_PROVIDER_SITE_OTHER): Payer: Self-pay

## 2022-01-12 DIAGNOSIS — F4310 Post-traumatic stress disorder, unspecified: Secondary | ICD-10-CM | POA: Diagnosis not present

## 2022-01-12 DIAGNOSIS — F3161 Bipolar disorder, current episode mixed, mild: Secondary | ICD-10-CM | POA: Diagnosis not present

## 2022-01-12 DIAGNOSIS — F419 Anxiety disorder, unspecified: Secondary | ICD-10-CM | POA: Diagnosis not present

## 2022-01-13 ENCOUNTER — Other Ambulatory Visit: Payer: Self-pay | Admitting: Internal Medicine

## 2022-01-13 DIAGNOSIS — I10 Essential (primary) hypertension: Secondary | ICD-10-CM

## 2022-01-13 DIAGNOSIS — E118 Type 2 diabetes mellitus with unspecified complications: Secondary | ICD-10-CM

## 2022-01-21 ENCOUNTER — Telehealth: Payer: Self-pay | Admitting: Internal Medicine

## 2022-01-21 NOTE — Telephone Encounter (Signed)
Left message for patient to call back to schedule Medicare Annual Wellness Visit   No hx of AWV eligible as of 06/06/21  Please schedule at anytime with [facility] Advisor if patient calls the office back.     Any questions, please call me at [phone]

## 2022-02-08 ENCOUNTER — Ambulatory Visit: Payer: PPO

## 2022-02-09 DIAGNOSIS — F4310 Post-traumatic stress disorder, unspecified: Secondary | ICD-10-CM | POA: Diagnosis not present

## 2022-02-12 DIAGNOSIS — Z79899 Other long term (current) drug therapy: Secondary | ICD-10-CM | POA: Diagnosis not present

## 2022-02-16 ENCOUNTER — Ambulatory Visit: Payer: PPO

## 2022-02-16 DIAGNOSIS — F419 Anxiety disorder, unspecified: Secondary | ICD-10-CM | POA: Diagnosis not present

## 2022-02-16 DIAGNOSIS — F3161 Bipolar disorder, current episode mixed, mild: Secondary | ICD-10-CM | POA: Diagnosis not present

## 2022-03-03 DIAGNOSIS — R059 Cough, unspecified: Secondary | ICD-10-CM | POA: Diagnosis not present

## 2022-03-03 DIAGNOSIS — J208 Acute bronchitis due to other specified organisms: Secondary | ICD-10-CM | POA: Diagnosis not present

## 2022-03-03 DIAGNOSIS — R509 Fever, unspecified: Secondary | ICD-10-CM | POA: Diagnosis not present

## 2022-03-03 DIAGNOSIS — R5383 Other fatigue: Secondary | ICD-10-CM | POA: Diagnosis not present

## 2022-03-04 ENCOUNTER — Other Ambulatory Visit: Payer: Self-pay | Admitting: Internal Medicine

## 2022-03-04 DIAGNOSIS — E119 Type 2 diabetes mellitus without complications: Secondary | ICD-10-CM

## 2022-03-07 ENCOUNTER — Other Ambulatory Visit: Payer: Self-pay | Admitting: Internal Medicine

## 2022-03-07 DIAGNOSIS — E118 Type 2 diabetes mellitus with unspecified complications: Secondary | ICD-10-CM

## 2022-03-30 DIAGNOSIS — F4310 Post-traumatic stress disorder, unspecified: Secondary | ICD-10-CM | POA: Diagnosis not present

## 2022-03-30 DIAGNOSIS — F3161 Bipolar disorder, current episode mixed, mild: Secondary | ICD-10-CM | POA: Diagnosis not present

## 2022-04-02 ENCOUNTER — Ambulatory Visit
Admission: RE | Admit: 2022-04-02 | Discharge: 2022-04-02 | Disposition: A | Discharge status: Home / Self Care | Payer: PPO | Source: Ambulatory Visit | Attending: Internal Medicine | Admitting: Internal Medicine

## 2022-04-02 DIAGNOSIS — Z1231 Encounter for screening mammogram for malignant neoplasm of breast: Secondary | ICD-10-CM

## 2022-04-05 ENCOUNTER — Telehealth: Payer: Self-pay | Admitting: Internal Medicine

## 2022-04-05 NOTE — Telephone Encounter (Signed)
Left message for patient to call back and schedule Medicare Annual Wellness Visit (AWV) in office.   Please offer to do virtually or by telephone.   due 06/06/21 awvi per [place]    Please schedule at any time with [facility].  30 minute appointment  Any questions, please contact me at [phone]   Thank you,   [place] [facility] [place] [facility] ??[phone] or ??[phone]

## 2022-04-19 ENCOUNTER — Other Ambulatory Visit: Payer: Self-pay | Admitting: Internal Medicine

## 2022-04-19 ENCOUNTER — Telehealth: Payer: Self-pay | Admitting: Internal Medicine

## 2022-04-19 DIAGNOSIS — E039 Hypothyroidism, unspecified: Secondary | ICD-10-CM

## 2022-04-19 MED ORDER — LEVOTHYROXINE SODIUM 25 MCG PO TABS: 25.0000 ug | ORAL_TABLET | Freq: Every morning | ORAL | 0 refills | Status: DC

## 2022-04-19 NOTE — Telephone Encounter (Signed)
Caller & Relationship to patient: self   Call back number:  [phone]   Date of last office visit:   Date of next office visit:  06/11/2022   Medication(s) to be refilled:  levothyroxine 25 mcg        Preferred Pharmacy:   [facility] on [address] and [address]

## 2022-04-27 ENCOUNTER — Ambulatory Visit: Payer: PPO

## 2022-04-27 DIAGNOSIS — F4310 Post-traumatic stress disorder, unspecified: Secondary | ICD-10-CM | POA: Diagnosis not present

## 2022-05-11 ENCOUNTER — Ambulatory Visit: Payer: PPO

## 2022-05-11 DIAGNOSIS — F3161 Bipolar disorder, current episode mixed, mild: Secondary | ICD-10-CM | POA: Diagnosis not present

## 2022-05-11 DIAGNOSIS — F419 Anxiety disorder, unspecified: Secondary | ICD-10-CM | POA: Diagnosis not present

## 2022-05-13 ENCOUNTER — Telehealth: Payer: Self-pay | Admitting: Internal Medicine

## 2022-05-13 DIAGNOSIS — E785 Hyperlipidemia, unspecified: Secondary | ICD-10-CM

## 2022-05-13 MED ORDER — ATORVASTATIN CALCIUM 40 MG PO TABS: ORAL_TABLET | ORAL | 2 refills | Status: DC

## 2022-05-13 NOTE — Telephone Encounter (Signed)
Sent refill to [facility]. Pt due in Oct for cpx.Marland KitchenJohny Guzman

## 2022-05-13 NOTE — Telephone Encounter (Signed)
Prescription Request  05/13/2022  LOV: 12/17/2021  What is the name of the medication or equipment? Atorvastatin '40mg'$   Have you contacted your pharmacy to request a refill? Yes   Which pharmacy would you like this sent to?  [facility] [place], [place] AT [facility] [address] [place] [address] Phone: [phone] Fax: [phone]    Patient notified that their request is being sent to the clinical staff for review and that they should receive a response within 2 business days.   Please advise at Mobile [phone] (mobile)

## 2022-05-18 ENCOUNTER — Ambulatory Visit (INDEPENDENT_AMBULATORY_CARE_PROVIDER_SITE_OTHER): Payer: PPO

## 2022-05-18 VITALS — Ht 62.0 in | Wt 168.0 lb

## 2022-05-18 DIAGNOSIS — Z0000 Encounter for general adult medical examination without abnormal findings: Secondary | ICD-10-CM

## 2022-05-18 NOTE — Patient Instructions (Signed)
Jessica Guzman , Thank you for taking time to come for your Medicare Wellness Visit. I appreciate your ongoing commitment to your health goals. Please review the following plan we discussed and let me know if I can assist you in the future.   These are the goals we discussed:  Goals      Remain active        This is a list of the screening recommended for you and due dates:  Health Maintenance  Topic Date Due   Yearly kidney health urinalysis for diabetes  05/27/2022   Hemoglobin A1C  06/18/2022   Eye exam for diabetics  07/23/2022   Yearly kidney function blood test for diabetes  12/18/2022   Complete foot exam   12/18/2022   Medicare Annual Wellness Visit  05/18/2023   Pap Smear  12/12/2023   Mammogram  04/02/2024   DTaP/Tdap/Td vaccine (2 - Td or Tdap) 06/17/2026   Colon Cancer Screening  11/27/2029   Flu Shot  Completed   Hepatitis C Screening: USPSTF Recommendation to screen - Ages 18-79 yo.  Completed   HIV Screening  Completed   Zoster (Shingles) Vaccine  Completed   HPV Vaccine  Aged Out   COVID-19 Vaccine  Discontinued    Advanced directives: Advance directive discussed with you today. I have provided a copy for you to complete at home and have notarized. Once this is complete please bring a copy in to our office so we can scan it into your chart.   Conditions/risks identified: Aim for 30 minutes of exercise or brisk walking, 6-8 glasses of water, and 5 servings of fruits and vegetables each day.   Next appointment: Follow up in one year for your annual wellness visit.   Preventive Care 40-64 Years, Female Preventive care refers to lifestyle choices and visits with your health care provider that can promote health and wellness. What does preventive care include? A yearly physical exam. This is also called an annual well check. Dental exams once or twice a year. Routine eye exams. Ask your health care provider how often you should have your eyes checked. Personal  lifestyle choices, including: Daily care of your teeth and gums. Regular physical activity. Eating a healthy diet. Avoiding tobacco and drug use. Limiting alcohol use. Practicing safe sex. Taking low-dose aspirin daily starting at age 20. Taking vitamin and mineral supplements as recommended by your health care provider. What happens during an annual well check? The services and screenings done by your health care provider during your annual well check will depend on your age, overall health, lifestyle risk factors, and family history of disease. Counseling  Your health care provider may ask you questions about your: Alcohol use. Tobacco use. Drug use. Emotional well-being. Home and relationship well-being. Sexual activity. Eating habits. Work and work Statistician. Method of birth control. Menstrual cycle. Pregnancy history. Screening  You may have the following tests or measurements: Height, weight, and BMI. Blood pressure. Lipid and cholesterol levels. These may be checked every 5 years, or more frequently if you are over 1 years old. Skin check. Lung cancer screening. You may have this screening every year starting at age 67 if you have a 30-pack-year history of smoking and currently smoke or have quit within the past 15 years. Fecal occult blood test (FOBT) of the stool. You may have this test every year starting at age 78. Flexible sigmoidoscopy or colonoscopy. You may have a sigmoidoscopy every 5 years or a colonoscopy every 10 years  starting at age 73. Hepatitis C blood test. Hepatitis B blood test. Sexually transmitted disease (STD) testing. Diabetes screening. This is done by checking your blood sugar (glucose) after you have not eaten for a while (fasting). You may have this done every 1-3 years. Mammogram. This may be done every 1-2 years. Talk to your health care provider about when you should start having regular mammograms. This may depend on whether you have a  family history of breast cancer. BRCA-related cancer screening. This may be done if you have a family history of breast, ovarian, tubal, or peritoneal cancers. Pelvic exam and Pap test. This may be done every 3 years starting at age 61. Starting at age 32, this may be done every 5 years if you have a Pap test in combination with an HPV test. Bone density scan. This is done to screen for osteoporosis. You may have this scan if you are at high risk for osteoporosis. Discuss your test results, treatment options, and if necessary, the need for more tests with your health care provider. Vaccines  Your health care provider may recommend certain vaccines, such as: Influenza vaccine. This is recommended every year. Tetanus, diphtheria, and acellular pertussis (Tdap, Td) vaccine. You may need a Td booster every 10 years. Zoster vaccine. You may need this after age 53. Pneumococcal 13-valent conjugate (PCV13) vaccine. You may need this if you have certain conditions and were not previously vaccinated. Pneumococcal polysaccharide (PPSV23) vaccine. You may need one or two doses if you smoke cigarettes or if you have certain conditions. Talk to your health care provider about which screenings and vaccines you need and how often you need them. This information is not intended to replace advice given to you by your health care provider. Make sure you discuss any questions you have with your health care provider. Document Released: 03/21/2015 Document Revised: 11/12/2015 Document Reviewed: 12/24/2014 Elsevier Interactive Patient Education  2017 [place] Prevention in the Home Falls can cause injuries. They can happen to people of all ages. There are many things you can do to make your home safe and to help prevent falls. What can I do on the outside of my home? Regularly fix the edges of walkways and driveways and fix any cracks. Remove anything that might make you trip as you walk through a  door, such as a raised step or threshold. Trim any bushes or trees on the path to your home. Use bright outdoor lighting. Clear any walking paths of anything that might make someone trip, such as rocks or tools. Regularly check to see if handrails are loose or broken. Make sure that both sides of any steps have handrails. Any raised decks and porches should have guardrails on the edges. Have any leaves, snow, or ice cleared regularly. Use sand or salt on walking paths during winter. Clean up any spills in your garage right away. This includes oil or grease spills. What can I do in the bathroom? Use night lights. Install grab bars by the toilet and in the tub and shower. Do not use towel bars as grab bars. Use non-skid mats or decals in the tub or shower. If you need to sit down in the shower, use a plastic, non-slip stool. Keep the floor dry. Clean up any water that spills on the floor as soon as it happens. Remove soap buildup in the tub or shower regularly. Attach bath mats securely with double-sided non-slip rug tape. Do not have throw  rugs and other things on the floor that can make you trip. What can I do in the bedroom? Use night lights. Make sure that you have a light by your bed that is easy to reach. Do not use any sheets or blankets that are too big for your bed. They should not hang down onto the floor. Have a firm chair that has side arms. You can use this for support while you get dressed. Do not have throw rugs and other things on the floor that can make you trip. What can I do in the kitchen? Clean up any spills right away. Avoid walking on wet floors. Keep items that you use a lot in easy-to-reach places. If you need to reach something above you, use a strong step stool that has a grab bar. Keep electrical cords out of the way. Do not use floor polish or wax that makes floors slippery. If you must use wax, use non-skid floor wax. Do not have throw rugs and other things  on the floor that can make you trip. What can I do with my stairs? Do not leave any items on the stairs. Make sure that there are handrails on both sides of the stairs and use them. Fix handrails that are broken or loose. Make sure that handrails are as long as the stairways. Check any carpeting to make sure that it is firmly attached to the stairs. Fix any carpet that is loose or worn. Avoid having throw rugs at the top or bottom of the stairs. If you do have throw rugs, attach them to the floor with carpet tape. Make sure that you have a light switch at the top of the stairs and the bottom of the stairs. If you do not have them, ask someone to add them for you. What else can I do to help prevent falls? Wear shoes that: Do not have high heels. Have rubber bottoms. Are comfortable and fit you well. Are closed at the toe. Do not wear sandals. If you use a stepladder: Make sure that it is fully opened. Do not climb a closed stepladder. Make sure that both sides of the stepladder are locked into place. Ask someone to hold it for you, if possible. Clearly mark and make sure that you can see: Any grab bars or handrails. First and last steps. Where the edge of each step is. Use tools that help you move around (mobility aids) if they are needed. These include: Canes. Walkers. Scooters. Crutches. Turn on the lights when you go into a dark area. Replace any light bulbs as soon as they burn out. Set up your furniture so you have a clear path. Avoid moving your furniture around. If any of your floors are uneven, fix them. If there are any pets around you, be aware of where they are. Review your medicines with your doctor. Some medicines can make you feel dizzy. This can increase your chance of falling. Ask your doctor what other things that you can do to help prevent falls. This information is not intended to replace advice given to you by your health care provider. Make sure you discuss any  questions you have with your health care provider. Document Released: 12/19/2008 Document Revised: 07/31/2015 Document Reviewed: 03/29/2014 Elsevier Interactive Patient Education  2017 [employer].

## 2022-05-18 NOTE — Progress Notes (Signed)
Subjective:   Jessica Guzman is a 62 y.o. female who presents for an Initial Medicare Annual Wellness Visit.  I connected with  Jessica Guzman on 05/18/22 by a audio enabled telemedicine application and verified that I am speaking with the correct person using two identifiers.  Patient Location: Home  Provider Location: Home Office  I discussed the limitations of evaluation and management by telemedicine. The patient expressed understanding and agreed to proceed.  Review of Systems     Cardiac Risk Factors include: diabetes mellitus;hypertension     Objective:    Today's Vitals   05/18/22 1023  Weight: 168 lb (76.2 kg)  Height: '5\' 2"'$  (1.575 m)   Body mass index is 30.73 kg/m.     05/18/2022    4:45 PM 03/09/2020    2:38 PM 05/03/2018    8:31 AM 01/20/2017    8:22 PM 01/20/2017    4:56 PM 05/20/2014   11:15 AM  Advanced Directives  Does Patient Have a Medical Advance Directive? No No No No No No  Would patient like information on creating a medical advance directive? Yes (MAU/Ambulatory/Procedural Areas - Information given)  No - Patient declined   Yes - Educational materials given    Current Medications (verified) Outpatient Encounter Medications as of 05/18/2022  Medication Sig   atorvastatin (LIPITOR) 40 MG tablet TAKE 1 TABLET(40 MG) BY MOUTH AT BEDTIME   B-D UF III MINI PEN NEEDLES 31G X 5 MM MISC USE ONCE DAILY   carbamazepine (TEGRETOL) 200 MG tablet Take 1 tablet by mouth 3 (three) times daily. AM, noon, and Bedtime   Continuous Blood Gluc Receiver (FREESTYLE LIBRE 2 READER) DEVI 1 Act by Does not apply route daily.   Continuous Blood Gluc Sensor (FREESTYLE LIBRE 2 SENSOR) MISC APPLY EVERY 14 DAYS   LANTUS SOLOSTAR 100 UNIT/ML Solostar Pen ADMINISTER 20 UNITS UNDER THE SKIN DAILY   levothyroxine (SYNTHROID) 25 MCG tablet Take 1 tablet (25 mcg total) by mouth every morning.   metFORMIN (GLUCOPHAGE) 1000 MG tablet TAKE 1 TABLET(1000 MG) BY MOUTH TWICE  DAILY WITH A MEAL   olmesartan (BENICAR) 40 MG tablet TAKE 1 TABLET(40 MG) BY MOUTH DAILY   QUEtiapine (SEROQUEL) 300 MG tablet Take 300 mg by mouth daily.   valACYclovir (VALTREX) 500 MG tablet Take 1 tablet (500 mg total) by mouth 2 (two) times daily.   [DISCONTINUED] ondansetron (ZOFRAN-ODT) 4 MG disintegrating tablet Take 1 tablet (4 mg total) by mouth every 8 (eight) hours as needed for nausea or vomiting.   No facility-administered encounter medications on file as of 05/18/2022.    Allergies (verified) Codeine   History: Past Medical History:  Diagnosis Date   Bipolar 1 disorder ([place])    Diabetes mellitus without complication ([place])    Hyperlipidemia    Hypertension    PTSD (post-traumatic stress disorder)    Past Surgical History:  Procedure Laterality Date   PCOS     TUBAL LIGATION     Family History  Problem Relation Age of Onset   Alzheimer's disease Mother    Diabetes Sister    High Cholesterol Sister    High blood pressure Sister    Bipolar disorder Brother    Stroke Maternal Grandfather    Diabetes Maternal Grandfather    Pancreatic cancer Paternal Grandfather    Social History   Socioeconomic History   Marital status: Divorced    Spouse name: Not on file   Number of children: Not on file  Years of education: Not on file   Highest education level: Bachelor's degree (e.g., BA, AB, BS)  Occupational History   Not on file  Tobacco Use   Smoking status: Some Days    Types: Cigarettes    Last attempt to quit: 02/01/2019    Years since quitting: 3.2   Smokeless tobacco: Never  Vaping Use   Vaping Use: Never used  Substance and Sexual Activity   Alcohol use: Not Currently    Comment: rarely   Drug use: Never   Sexual activity: Not Currently  Other Topics Concern   Not on file  Social History Narrative   Lives alone   Right handed   Caffeine: 2-4 cups of coffee   Social Determinants of Health   Financial Resource Strain: Low Risk  (05/18/2022)    Overall Financial Resource Strain (CARDIA)    Difficulty of Paying Living Expenses: Not hard at all  Food Insecurity: No Food Insecurity (05/18/2022)   Hunger Vital Sign    Worried About Running Out of Food in the Last Year: Never true    Ran Out of Food in the Last Year: Never true  Transportation Needs: No Transportation Needs (05/18/2022)   PRAPARE - Hydrologist (Medical): No    Lack of Transportation (Non-Medical): No  Physical Activity: Insufficiently Active (05/18/2022)   Exercise Vital Sign    Days of Exercise per Week: 4 days    Minutes of Exercise per Session: 30 min  Stress: Stress Concern Present (05/18/2022)   [place]    Feeling of Stress : To some extent  Social Connections: Socially Isolated (05/18/2022)   Social Connection and Isolation Panel [NHANES]    Frequency of Communication with Friends and Family: More than three times a week    Frequency of Social Gatherings with Friends and Family: More than three times a week    Attends Religious Services: Never    Marine scientist or Organizations: No    Attends Music therapist: Never    Marital Status: Divorced    Tobacco Counseling Ready to quit: Not Answered Counseling given: Not Answered   Clinical Intake:  Pre-visit preparation completed: Yes  Pain : No/denies pain  Diabetes: Yes CBG done?: No Did pt. bring in CBG monitor from home?: No  How often do you need to have someone help you when you read instructions, pamphlets, or other written materials from your doctor or pharmacy?: 1 - Never  Diabetic?Yes   Nutrition Risk Assessment:  Has the patient had any N/V/D within the last 2 months?  No  Does the patient have any non-healing wounds?  No  Has the patient had any unintentional weight loss or weight gain?  No   Diabetes:  Is the patient diabetic?  Yes  If diabetic, was a CBG  obtained today?  No  Did the patient bring in their glucometer from home?  No  How often do you monitor your CBG's? Libre.   Financial Strains and Diabetes Management:  Are you having any financial strains with the device, your supplies or your medication? No .  Does the patient want to be seen by Chronic Care Management for management of their diabetes?  No  Would the patient like to be referred to a Nutritionist or for Diabetic Management?  No   Diabetic Exams:  Diabetic Eye Exam: Completed 07/22/21 Diabetic Foot Exam: Completed 12/17/21   Interpreter Needed?:  No  Information entered by :: Denman George LPN   Activities of Daily Living    05/18/2022    4:45 PM 05/17/2022    8:04 PM  In your present state of health, do you have any difficulty performing the following activities:  Hearing? 0 0  Vision? 0 0  Difficulty concentrating or making decisions? 0 0  Walking or climbing stairs? 0 0  Dressing or bathing? 0 0  Doing errands, shopping? 0 0  Preparing Food and eating ? N N  Using the Toilet? N N  In the past six months, have you accidently leaked urine? N N  Do you have problems with loss of bowel control? N N  Managing your Medications? N N  Managing your Finances? N N  Housekeeping or managing your Housekeeping? N N    Patient Care Team: Janith Lima, MD as PCP - General (Internal Medicine)  Indicate any recent Medical Services you may have received from other than [facility] providers in the past year (date may be approximate).     Assessment:   This is a routine wellness examination for Jessica Guzman.  Hearing/Vision screen Hearing Screening - Comments:: Denies hearing difficulties  Vision Screening - Comments:: Wears rx glasses - up to date with routine eye exams with [facility]    Dietary issues and exercise activities discussed: Current Exercise Habits: Home exercise routine, Type of exercise: walking, Time (Minutes): 30, Frequency (Times/Week): 4, Weekly  Exercise (Minutes/Week): 120, Intensity: Mild   Goals Addressed             This Visit's Progress    Remain active        Depression Screen    05/18/2022    4:44 PM 05/20/2014   11:16 AM  PHQ 2/9 Scores  PHQ - 2 Score 0 0    Fall Risk    05/18/2022   10:25 AM 05/17/2022    8:04 PM 05/20/2014   11:16 AM  Fall Risk   Falls in the past year? 0 0 No  Number falls in past yr: 0    Injury with Fall? 0 0   Risk for fall due to : No Fall Risks    Follow up Falls prevention discussed;Education provided;Falls evaluation completed      FALL RISK PREVENTION PERTAINING TO THE HOME:  Any stairs in or around the home? No  If so, are there any without handrails? No  Home free of loose throw rugs in walkways, pet beds, electrical cords, etc? Yes  Adequate lighting in your home to reduce risk of falls? Yes   ASSISTIVE DEVICES UTILIZED TO PREVENT FALLS:  Life alert? No  Use of a cane, walker or w/c? No  Grab bars in the bathroom? Yes  Shower chair or bench in shower? No  Elevated toilet seat or a handicapped toilet? Yes   TIMED UP AND GO:  Was the test performed? No . Telephonic visit   Cognitive Function:        05/18/2022    4:45 PM  6CIT Screen  What Year? 0 points  What month? 0 points  What time? 0 points  Count back from 20 0 points  Months in reverse 0 points  Repeat phrase 0 points  Total Score 0 points    Immunizations Immunization History  Administered Date(s) Administered   Influenza,inj,Quad PF,6+ Mos 12/17/2021   Influenza-Unspecified 12/03/2020   PFIZER(Purple Top)SARS-COV-2 Vaccination 05/18/2019, 06/08/2019, 08/05/2020, 12/03/2020   Pneumococcal Conjugate-13 09/09/2020   Pneumococcal Polysaccharide-23  12/17/2021   Tdap 06/16/2016   Zoster Recombinat (Shingrix) 12/03/2020, 01/12/2021    TDAP status: Up to date  Flu Vaccine status: Up to date  Pneumococcal vaccine status: Up to date  Covid-19 vaccine status: Information provided on how to  obtain vaccines.   Qualifies for Shingles Vaccine? Yes   Zostavax completed No   Shingrix Completed?: Yes  Screening Tests Health Maintenance  Topic Date Due   Diabetic kidney evaluation - Urine ACR  05/27/2022   HEMOGLOBIN A1C  06/18/2022   OPHTHALMOLOGY EXAM  07/23/2022   Diabetic kidney evaluation - eGFR measurement  12/18/2022   FOOT EXAM  12/18/2022   Medicare Annual Wellness (AWV)  05/18/2023   PAP SMEAR-Modifier  12/12/2023   MAMMOGRAM  04/02/2024   DTaP/Tdap/Td (2 - Td or Tdap) 06/17/2026   COLONOSCOPY (Pts 45-20yr Insurance coverage will need to be confirmed)  11/27/2029   INFLUENZA VACCINE  Completed   Hepatitis C Screening  Completed   HIV Screening  Completed   Zoster Vaccines- Shingrix  Completed   HPV VACCINES  Aged Out   COVID-19 Vaccine  Discontinued    Health Maintenance  Health Maintenance Due  Topic Date Due   Diabetic kidney evaluation - Urine ACR  05/27/2022    Colorectal cancer screening: Type of screening: Colonoscopy. Completed 11/28/19. Repeat every 10 years  Mammogram status: Completed 04/02/22. Repeat every year  Lung Cancer Screening: (Low Dose CT Chest recommended if Age 60-80years, 30 pack-year currently smoking OR have quit w/in 15years.) does not qualify.   Lung Cancer Screening Referral: n/a  Additional Screening:  Hepatitis C Screening: does qualify; Completed 12/17/21  Vision Screening: Recommended annual ophthalmology exams for early detection of glaucoma and other disorders of the eye. Is the patient up to date with their annual eye exam?  Yes  Who is the provider or what is the name of the office in which the patient attends annual eye exams? [facility] If pt is not established with a provider, would they like to be referred to a provider to establish care? No .   Dental Screening: Recommended annual dental exams for proper oral hygiene  Community Resource Referral / Chronic Care Management: CRR required this visit?  No    CCM required this visit?  No      Plan:     I have personally reviewed and noted the following in the patient's chart:   Medical and social history Use of alcohol, tobacco or illicit drugs  Current medications and supplements including opioid prescriptions. Patient is not currently taking opioid prescriptions. Functional ability and status Nutritional status Physical activity Advanced directives List of other physicians Hospitalizations, surgeries, and ER visits in previous 12 months Vitals Screenings to include cognitive, depression, and falls Referrals and appointments  In addition, I have reviewed and discussed with patient certain preventive protocols, quality metrics, and best practice recommendations. A written personalized care plan for preventive services as well as general preventive health recommendations were provided to patient.     SVanetta Mulders Guzman  [id number]  Due to this being a virtual visit, the after visit summary with patients personalized plan was offered to patient via mail or my-chart. Patient would like to access on my-chart  Nurse Notes: No concerns

## 2022-05-25 DIAGNOSIS — F4310 Post-traumatic stress disorder, unspecified: Secondary | ICD-10-CM | POA: Diagnosis not present

## 2022-05-31 ENCOUNTER — Other Ambulatory Visit: Payer: Self-pay | Admitting: Internal Medicine

## 2022-05-31 DIAGNOSIS — Z794 Long term (current) use of insulin: Secondary | ICD-10-CM

## 2022-05-31 DIAGNOSIS — A6004 Herpesviral vulvovaginitis: Secondary | ICD-10-CM

## 2022-06-03 ENCOUNTER — Telehealth: Payer: Self-pay | Admitting: Internal Medicine

## 2022-06-03 ENCOUNTER — Other Ambulatory Visit: Payer: Self-pay | Admitting: Internal Medicine

## 2022-06-03 DIAGNOSIS — E119 Type 2 diabetes mellitus without complications: Secondary | ICD-10-CM

## 2022-06-03 DIAGNOSIS — A6004 Herpesviral vulvovaginitis: Secondary | ICD-10-CM

## 2022-06-03 MED ORDER — VALACYCLOVIR HCL 500 MG PO TABS: 500.0000 mg | ORAL_TABLET | Freq: Every day | ORAL | 0 refills | Status: DC

## 2022-06-03 MED ORDER — LANTUS SOLOSTAR 100 UNIT/ML ~~LOC~~ SOPN: PEN_INJECTOR | SUBCUTANEOUS | 0 refills | Status: DC

## 2022-06-03 NOTE — Telephone Encounter (Signed)
Prescription Request  06/03/2022  LOV: 12/17/2021  What is the name of the medication or equipment?  LANTUS SOLOSTAR 100 UNIT/ML Solostar Pen   valACYclovir (VALTREX) 500 MG tablet  Have you contacted your pharmacy to request a refill? No   Which pharmacy would you like this sent to?    [facility] [place], [place] AT [facility] OF [address] [place] [address] Phone: [phone] Fax: [place] (New Address) - [place], [place] notified that their request is being sent to the clinical staff for review and that they should receive a response within 2 business days.   Please advise at Mobile [phone] (mobile)

## 2022-06-08 DIAGNOSIS — F3161 Bipolar disorder, current episode mixed, mild: Secondary | ICD-10-CM | POA: Diagnosis not present

## 2022-06-08 DIAGNOSIS — F419 Anxiety disorder, unspecified: Secondary | ICD-10-CM | POA: Diagnosis not present

## 2022-06-14 ENCOUNTER — Other Ambulatory Visit: Payer: Self-pay | Admitting: Internal Medicine

## 2022-06-14 DIAGNOSIS — E118 Type 2 diabetes mellitus with unspecified complications: Secondary | ICD-10-CM

## 2022-06-21 ENCOUNTER — Encounter: Payer: Self-pay | Admitting: Internal Medicine

## 2022-06-21 ENCOUNTER — Ambulatory Visit (INDEPENDENT_AMBULATORY_CARE_PROVIDER_SITE_OTHER): Payer: PPO | Admitting: Internal Medicine

## 2022-06-21 VITALS — BP 144/78 | HR 73 | Temp 98.6°F | Resp 16 | Ht 62.0 in | Wt 161.0 lb

## 2022-06-21 DIAGNOSIS — E118 Type 2 diabetes mellitus with unspecified complications: Secondary | ICD-10-CM | POA: Diagnosis not present

## 2022-06-21 DIAGNOSIS — I10 Essential (primary) hypertension: Secondary | ICD-10-CM

## 2022-06-21 DIAGNOSIS — R0609 Other forms of dyspnea: Secondary | ICD-10-CM

## 2022-06-21 DIAGNOSIS — E785 Hyperlipidemia, unspecified: Secondary | ICD-10-CM | POA: Diagnosis not present

## 2022-06-21 DIAGNOSIS — E039 Hypothyroidism, unspecified: Secondary | ICD-10-CM | POA: Diagnosis not present

## 2022-06-21 DIAGNOSIS — Z23 Encounter for immunization: Secondary | ICD-10-CM | POA: Diagnosis not present

## 2022-06-21 DIAGNOSIS — Z794 Long term (current) use of insulin: Secondary | ICD-10-CM | POA: Diagnosis not present

## 2022-06-21 DIAGNOSIS — E119 Type 2 diabetes mellitus without complications: Secondary | ICD-10-CM

## 2022-06-21 LAB — MICROALBUMIN / CREATININE URINE RATIO
Creatinine,U: 49.2 mg/dL
Microalb Creat Ratio: 1.4 mg/g (ref 0.0–30.0)
Microalb, Ur: <0.7 mg/dL (ref 0.0–1.9)

## 2022-06-21 LAB — CBC WITH DIFFERENTIAL/PLATELET
Basophils Absolute: 0 10*3/uL (ref 0.0–0.1)
Basophils Relative: 0.3 % (ref 0.0–3.0)
Eosinophils Absolute: 0.3 10*3/uL (ref 0.0–0.7)
Eosinophils Relative: 3.6 % (ref 0.0–5.0)
HCT: 40.4 % (ref 36.0–46.0)
Hemoglobin: 13.7 g/dL (ref 12.0–15.0)
Lymphocytes Relative: 28 % (ref 12.0–46.0)
Lymphs Abs: 2.2 10*3/uL (ref 0.7–4.0)
MCHC: 34 g/dL (ref 30.0–36.0)
MCV: 85.5 fl (ref 78.0–100.0)
Monocytes Absolute: 0.7 10*3/uL (ref 0.1–1.0)
Monocytes Relative: 8.7 % (ref 3.0–12.0)
Neutro Abs: 4.6 10*3/uL (ref 1.4–7.7)
Neutrophils Relative %: 59.4 % (ref 43.0–77.0)
Platelets: 290 10*3/uL (ref 150.0–400.0)
RBC: 4.73 Mil/uL (ref 3.87–5.11)
RDW: 14.1 % (ref 11.5–15.5)
WBC: 7.7 10*3/uL (ref 4.0–10.5)

## 2022-06-21 LAB — LIPID PANEL
Cholesterol: 182 mg/dL (ref 0–200)
HDL: 67.1 mg/dL (ref >39.00)
LDL Cholesterol: 97 mg/dL (ref 0–99)
NonHDL: 115.33
Total CHOL/HDL Ratio: 3
Triglycerides: 90 mg/dL (ref 0.0–149.0)
VLDL: 18 mg/dL (ref 0.0–40.0)

## 2022-06-21 LAB — URINALYSIS, ROUTINE W REFLEX MICROSCOPIC
Bilirubin Urine: NEGATIVE
Hgb urine dipstick: NEGATIVE
Ketones, ur: NEGATIVE
Nitrite: NEGATIVE
Specific Gravity, Urine: 1.01 (ref 1.000–1.030)
Total Protein, Urine: NEGATIVE
Urine Glucose: NEGATIVE
Urobilinogen, UA: 0.2 (ref 0.0–1.0)
pH: 7 (ref 5.0–8.0)

## 2022-06-21 LAB — HEPATIC FUNCTION PANEL
ALT: 33 U/L (ref 0–35)
AST: 25 U/L (ref 0–37)
Albumin: 4.6 g/dL (ref 3.5–5.2)
Alkaline Phosphatase: 70 U/L (ref 39–117)
Bilirubin, Direct: 0.1 mg/dL (ref 0.0–0.3)
Total Bilirubin: 0.3 mg/dL (ref 0.2–1.2)
Total Protein: 7.2 g/dL (ref 6.0–8.3)

## 2022-06-21 LAB — BASIC METABOLIC PANEL
BUN: 8 mg/dL (ref 6–23)
CO2: 28 mEq/L (ref 19–32)
Calcium: 9.7 mg/dL (ref 8.4–10.5)
Chloride: 93 mEq/L — ABNORMAL LOW (ref 96–112)
Creatinine, Ser: 0.51 mg/dL (ref 0.40–1.20)
GFR: 102.11 mL/min (ref >60.00)
Glucose, Bld: 108 mg/dL — ABNORMAL HIGH (ref 70–99)
Potassium: 4.7 mEq/L (ref 3.5–5.1)
Sodium: 129 mEq/L — ABNORMAL LOW (ref 135–145)

## 2022-06-21 LAB — TSH: TSH: 0.85 u[IU]/mL (ref 0.35–5.50)

## 2022-06-21 LAB — HEMOGLOBIN A1C: Hgb A1c MFr Bld: 6 % (ref 4.6–6.5)

## 2022-06-21 MED ORDER — LEVOTHYROXINE SODIUM 25 MCG PO TABS
25.0000 ug | ORAL_TABLET | Freq: Every morning | ORAL | 1 refills | Status: DC
Start: 2022-06-21 — End: 2022-11-18

## 2022-06-21 MED ORDER — FREESTYLE LIBRE 3 READER DEVI
1.0000 | Freq: Every day | 3 refills | Status: DC
Start: 2022-06-21 — End: 2023-02-23

## 2022-06-21 MED ORDER — OLMESARTAN MEDOXOMIL 40 MG PO TABS: 40.0000 mg | ORAL_TABLET | Freq: Every day | ORAL | 1 refills | Status: DC

## 2022-06-21 MED ORDER — FREESTYLE LIBRE 3 SENSOR MISC
1.0000 | Freq: Every day | 5 refills | Status: DC
Start: 2022-06-21 — End: 2022-08-23

## 2022-06-21 NOTE — Progress Notes (Signed)
Subjective:  Patient ID: Jessica Guzman, female    DOB: 1960/11/30  Age: 62 y.o. MRN: [id number]  CC: Diabetes, Hyperlipidemia, Hypertension, and Hypothyroidism   HPI Jessica Guzman presents for f/up ----  She has had a few episodes of asymptomatic hypoglycemia. During her visit she told me that she was walking 3 miles every other day and denied chest pain, shortness of breath, diaphoresis, or edema.  I later got the following email.  "So you asked about SOB I believe and my thyroid medicine?  I do get abnormally out of breath not for general activities but for instance I did a 15 minute arm workout this morning with 5 pound weights and I was extremely out of breath which isn't my norm.  At [place]ent[place] I was performing a simple dance at a [employer] and it took me two songs to recover.  Perhaps my thyroid med is off?  I think that is what you were asking.  Perhaps I do need to increase and monitor my thyroid more closely.  What do you think? Normal walking is fine but I know I'm not that out of shape in relation to the breathlessness I'm experiencing. Thanks"    Outpatient Medications Prior to Visit  Medication Sig Dispense Refill   atorvastatin (LIPITOR) 40 MG tablet TAKE 1 TABLET(40 MG) BY MOUTH AT BEDTIME 90 tablet 2   carbamazepine (TEGRETOL) 200 MG tablet Take 1 tablet by mouth 3 (three) times daily. AM, noon, and Bedtime     metFORMIN (GLUCOPHAGE) 1000 MG tablet TAKE 1 TABLET(1000 MG) BY MOUTH TWICE DAILY WITH A MEAL 180 tablet 0   QUEtiapine (SEROQUEL) 300 MG tablet Take 300 mg by mouth daily.     valACYclovir (VALTREX) 500 MG tablet Take 1 tablet (500 mg total) by mouth daily. 30 tablet 0   B-D UF III MINI PEN NEEDLES 31G X 5 MM MISC USE ONCE DAILY 100 each 1   Continuous Blood Gluc Receiver (FREESTYLE LIBRE 2 READER) DEVI 1 Act by Does not apply route daily. 2 each 5   Continuous Blood Gluc Sensor (FREESTYLE LIBRE 2 SENSOR) MISC APPLY EVERY 14 DAYS 2 each 5    insulin glargine (LANTUS SOLOSTAR) 100 UNIT/ML Solostar Pen ADMINISTER 20 UNITS UNDER THE SKIN DAILY 6 mL 0   levothyroxine (SYNTHROID) 25 MCG tablet Take 1 tablet (25 mcg total) by mouth every morning. 90 tablet 0   olmesartan (BENICAR) 40 MG tablet TAKE 1 TABLET(40 MG) BY MOUTH DAILY 90 tablet 1   No facility-administered medications prior to visit.    ROS Review of Systems  Constitutional:  Positive for fatigue. Negative for appetite change, chills, diaphoresis, fever and unexpected weight change.  HENT: Negative.    Eyes: Negative.  Negative for visual disturbance.  Respiratory:  Positive for shortness of breath. Negative for cough, chest tightness and wheezing.   Gastrointestinal:  Negative for abdominal pain, constipation, diarrhea, nausea and vomiting.  Genitourinary: Negative.  Negative for difficulty urinating.  Musculoskeletal: Negative.  Negative for myalgias.  Skin: Negative.   Neurological:  Negative for dizziness, weakness, light-headedness and headaches.  Hematological:  Negative for adenopathy. Does not bruise/bleed easily.  Psychiatric/Behavioral:  Positive for dysphoric mood and sleep disturbance. Negative for behavioral problems, confusion, decreased concentration and suicidal ideas. The patient is not nervous/anxious and is not hyperactive.     Objective:  BP (!) 144/78 (BP Location: Left Arm, Patient Position: Sitting, Cuff Size: Large)   Pulse 73   Temp 98.6 F (  37 C) (Oral)   Resp 16   Ht 5\' 2"  (1.575 m)   Wt 161 lb (73 kg)   SpO2 98%   BMI 29.45 kg/m   BP Readings from Last 3 Encounters:  06/21/22 (!) 144/78  12/17/21 138/66  09/30/21 131/81    Wt Readings from Last 3 Encounters:  06/21/22 161 lb (73 kg)  05/18/22 168 lb (76.2 kg)  12/17/21 168 lb (76.2 kg)    Physical Exam Vitals reviewed.  Constitutional:      Appearance: Normal appearance.  HENT:     Mouth/Throat:     Mouth: Mucous membranes are moist.  Eyes:     General: No scleral  icterus.    Conjunctiva/sclera: Conjunctivae normal.  Cardiovascular:     Rate and Rhythm: Normal rate and regular rhythm.     Heart sounds: S1 normal and S2 normal. Murmur heard.     Systolic murmur is present with a grade of 1/6.     No diastolic murmur is present.     No friction rub. No gallop.  Pulmonary:     Effort: Pulmonary effort is normal.     Breath sounds: No stridor. No wheezing, rhonchi or rales.  Abdominal:     General: Abdomen is flat.     Palpations: There is no mass.     Tenderness: There is no abdominal tenderness. There is no guarding.     Hernia: No hernia is present.  Musculoskeletal:     Cervical back: Neck supple.     Right lower leg: No edema.     Left lower leg: No edema.  Lymphadenopathy:     Cervical: No cervical adenopathy.  Skin:    General: Skin is warm and dry.     Coloration: Skin is not pale.     Findings: No rash.  Neurological:     General: No focal deficit present.     Mental Status: She is alert. Mental status is at baseline.  Psychiatric:        Mood and Affect: Mood normal.        Behavior: Behavior normal.     Lab Results  Component Value Date   WBC 7.7 06/21/2022   HGB 13.7 06/21/2022   HCT 40.4 06/21/2022   PLT 290.0 06/21/2022   GLUCOSE 108 (H) 06/21/2022   CHOL 182 06/21/2022   TRIG 90.0 06/21/2022   HDL 67.10 06/21/2022   LDLCALC 97 06/21/2022   ALT 33 06/21/2022   AST 25 06/21/2022   NA 129 (L) 06/21/2022   K 4.7 06/21/2022   CL 93 (L) 06/21/2022   CREATININE 0.51 06/21/2022   BUN 8 06/21/2022   CO2 28 06/21/2022   TSH 0.85 06/21/2022   INR 1.0 03/09/2020   HGBA1C 6.0 06/21/2022   MICROALBUR <0.7 06/21/2022    MM 3D SCREEN BREAST BILATERAL  Result Date: 04/06/2022 CLINICAL DATA:  Screening. EXAM: DIGITAL SCREENING BILATERAL MAMMOGRAM WITH TOMOSYNTHESIS AND CAD TECHNIQUE: Bilateral screening digital craniocaudal and mediolateral oblique mammograms were obtained. Bilateral screening digital breast tomosynthesis  was performed. The images were evaluated with computer-aided detection. COMPARISON:  Previous exam(s). ACR Breast Density Category a: The breast tissue is almost entirely fatty. FINDINGS: There are no findings suspicious for malignancy. IMPRESSION: No mammographic evidence of malignancy. A result letter of this screening mammogram will be mailed directly to the patient. RECOMMENDATION: Screening mammogram in one year. (Code:SM-B-01Y) BI-RADS CATEGORY  1: Negative. Electronically Signed   By: Frederico Hamman M.D.   On:  04/06/2022 11:43    Assessment & Plan:   Insulin-requiring or dependent type II diabetes mellitus- Her A1c is down to 6.0%.  Will discontinue the basal insulin. -     Microalbumin / creatinine urine ratio; Future -     Basic metabolic panel; Future -     Hemoglobin A1c; Future -     Urinalysis, Routine w reflex microscopic; Future -     FreeStyle Libre 3 Sensor; 1 Act by Does not apply route daily. Place 1 sensor on the skin every 14 days. Use to check glucose continuously  Dispense: 2 each; Refill: 5 -     FreeStyle Libre 3 Reader; 1 Act by Does not apply route daily.  Dispense: 1 each; Refill: 3 -     Gvoke HypoPen 2-Pack; Inject 1 Act into the skin daily as needed.  Dispense: 2 mL; Refill: 5  Primary hypertension Her BP is well controlled. -     Basic metabolic panel; Future -     CBC with Differential/Platelet; Future -     TSH; Future -     Urinalysis, Routine w reflex microscopic; Future -     Olmesartan Medoxomil; Take 1 tablet (40 mg total) by mouth daily.  Dispense: 90 tablet; Refill: 1  Dyslipidemia, goal LDL below 100 - LDL goal achieved. Doing well on the statin  -     Lipid panel; Future -     Hepatic function panel; Future -     TSH; Future  Acquired hypothyroidism- She is euthyroid. -     CBC with Differential/Platelet; Future -     TSH; Future -     Levothyroxine Sodium; Take 1 tablet (25 mcg total) by mouth every morning.  Dispense: 90 tablet; Refill:  1  Type II diabetes mellitus with complication -     Olmesartan Medoxomil; Take 1 tablet (40 mg total) by mouth daily.  Dispense: 90 tablet; Refill: 1  DOE (dyspnea on exertion) -     CT CARDIAC SCORING (SELF PAY ONLY); Future  Other orders -     Pneumococcal polysaccharide vaccine 23-valent greater than or equal to 2yo subcutaneous/IM     Follow-up: Return in about 6 months (around 12/21/2022).  Sanda Linger, MD

## 2022-06-21 NOTE — Patient Instructions (Signed)

## 2022-06-22 ENCOUNTER — Encounter: Payer: Self-pay | Admitting: Internal Medicine

## 2022-06-24 DIAGNOSIS — F4310 Post-traumatic stress disorder, unspecified: Secondary | ICD-10-CM | POA: Diagnosis not present

## 2022-06-25 ENCOUNTER — Encounter: Payer: Self-pay | Admitting: Internal Medicine

## 2022-06-25 MED ORDER — GVOKE HYPOPEN 2-PACK 1 MG/0.2ML ~~LOC~~ SOAJ
1.0000 | Freq: Every day | SUBCUTANEOUS | 5 refills | Status: AC | PRN
Start: 2022-06-25 — End: ?

## 2022-06-25 NOTE — Telephone Encounter (Signed)
I was able to speak with the pt and inform of her Dr. Yetta Barre advice. I was able to add pt on for Monday with [place] at 9 am to get an EKG as provider requested.

## 2022-06-28 ENCOUNTER — Encounter: Payer: Self-pay | Admitting: Family Medicine

## 2022-06-28 ENCOUNTER — Ambulatory Visit (INDEPENDENT_AMBULATORY_CARE_PROVIDER_SITE_OTHER): Payer: PPO | Admitting: Family Medicine

## 2022-06-28 VITALS — BP 138/68 | HR 72 | Temp 98.4°F | Resp 20 | Ht 62.0 in | Wt 159.0 lb

## 2022-06-28 DIAGNOSIS — R0609 Other forms of dyspnea: Secondary | ICD-10-CM

## 2022-06-28 NOTE — Progress Notes (Signed)
Assessment & Plan:  1. Dyspnea on exertion Education provided on shortness of breath.  Reassurance provided that her EKG shows no indication of why she is experiencing dyspnea on exertion.  Encouraged patient to listen to her body and slow down.  Discussed that during manic episode she needs to know her body limits.  Also discussed that this could be residual from her 6-week respiratory illness earlier this year.  She will still complete the CT coronary calcium score. - EKG 12-Lead   Follow up plan: Return if symptoms worsen or fail to improve.  Deliah Boston, MSN, APRN, FNP-C  Subjective:  HPI: Jessica Guzman is a 62 y.o. female presenting on 06/28/2022 for Shortness of Breath (EKG needed per PCP )  Patient is here for an EKG as recommended by her PCP due to dyspnea on exertion.  A CT coronary calcium score was ordered, but not yet scheduled.  Patient reports she has had two big losses this year and had a severe manic episode after the first.  She wonders if her body is just telling her to slow down.  She does report a respiratory illness at the beginning of the year at which time she was sick x6 weeks.    ROS: Negative unless specifically indicated above in HPI.   Relevant past medical history reviewed and updated as indicated.   Allergies and medications reviewed and updated.   Current Outpatient Medications:    atorvastatin (LIPITOR) 40 MG tablet, TAKE 1 TABLET(40 MG) BY MOUTH AT BEDTIME, Disp: 90 tablet, Rfl: 2   carbamazepine (TEGRETOL) 200 MG tablet, Take 1 tablet by mouth 3 (three) times daily. AM, noon, and Bedtime, Disp: , Rfl:    Continuous Blood Gluc Receiver (FREESTYLE LIBRE 3 READER) DEVI, 1 Act by Does not apply route daily., Disp: 1 each, Rfl: 3   Continuous Blood Gluc Sensor (FREESTYLE LIBRE 3 SENSOR) MISC, 1 Act by Does not apply route daily. Place 1 sensor on the skin every 14 days. Use to check glucose continuously, Disp: 2 each, Rfl: 5   levothyroxine  (SYNTHROID) 25 MCG tablet, Take 1 tablet (25 mcg total) by mouth every morning., Disp: 90 tablet, Rfl: 1   metFORMIN (GLUCOPHAGE) 1000 MG tablet, TAKE 1 TABLET(1000 MG) BY MOUTH TWICE DAILY WITH A MEAL, Disp: 180 tablet, Rfl: 0   olmesartan (BENICAR) 40 MG tablet, Take 1 tablet (40 mg total) by mouth daily., Disp: 90 tablet, Rfl: 1   QUEtiapine (SEROQUEL) 300 MG tablet, Take 300 mg by mouth daily., Disp: , Rfl:    valACYclovir (VALTREX) 500 MG tablet, Take 1 tablet (500 mg total) by mouth daily., Disp: 30 tablet, Rfl: 0   Glucagon (GVOKE HYPOPEN 2-PACK) 1 MG/0.2ML SOAJ, Inject 1 Act into the skin daily as needed. (Patient not taking: Reported on 06/28/2022), Disp: 2 mL, Rfl: 5  Allergies  Allergen Reactions   Codeine Nausea And Vomiting    Objective:   BP 138/68   Pulse 72   Temp 98.4 F (36.9 C)   Resp 20   Ht  (1.575 m)   Wt 159 lb (72.1 kg)   BMI 29.08 kg/m    Physical Exam Vitals reviewed.  Constitutional:      General: She is not in acute distress.    Appearance: Normal appearance. She is overweight. She is not ill-appearing, toxic-appearing or diaphoretic.  HENT:     Head: Normocephalic and atraumatic.  Eyes:     General: No scleral icterus.  Right eye: No discharge.        Left eye: No discharge.     Conjunctiva/sclera: Conjunctivae normal.  Cardiovascular:     Rate and Rhythm: Normal rate and regular rhythm.     Heart sounds: Murmur heard.     No friction rub. No gallop.  Pulmonary:     Effort: Pulmonary effort is normal. No respiratory distress.     Breath sounds: Normal breath sounds. No stridor. No wheezing, rhonchi or rales.  Musculoskeletal:        General: Normal range of motion.     Cervical back: Normal range of motion.  Skin:    General: Skin is warm and dry.     Capillary Refill: Capillary refill takes less than 2 seconds.  Neurological:     General: No focal deficit present.     Mental Status: She is alert and oriented to person, place,  and time. Mental status is at baseline.  Psychiatric:        Mood and Affect: Mood normal.        Behavior: Behavior normal.        Thought Content: Thought content normal.        Judgment: Judgment normal.    EKG: normal EKG, normal sinus rhythm.

## 2022-07-17 ENCOUNTER — Encounter: Payer: Self-pay | Admitting: Internal Medicine

## 2022-07-20 DIAGNOSIS — F3161 Bipolar disorder, current episode mixed, mild: Secondary | ICD-10-CM | POA: Diagnosis not present

## 2022-07-21 ENCOUNTER — Ambulatory Visit: Payer: PPO | Admitting: Internal Medicine

## 2022-07-23 DIAGNOSIS — Z79899 Other long term (current) drug therapy: Secondary | ICD-10-CM | POA: Diagnosis not present

## 2022-07-30 ENCOUNTER — Ambulatory Visit
Admission: RE | Admit: 2022-07-30 | Discharge: 2022-07-30 | Disposition: A | Discharge status: Home / Self Care | Payer: No Typology Code available for payment source | Source: Ambulatory Visit | Attending: Internal Medicine | Admitting: Internal Medicine

## 2022-07-30 DIAGNOSIS — R0609 Other forms of dyspnea: Secondary | ICD-10-CM

## 2022-07-31 ENCOUNTER — Other Ambulatory Visit: Payer: Self-pay | Admitting: Internal Medicine

## 2022-07-31 DIAGNOSIS — R5382 Chronic fatigue, unspecified: Secondary | ICD-10-CM

## 2022-07-31 DIAGNOSIS — R0609 Other forms of dyspnea: Secondary | ICD-10-CM

## 2022-07-31 DIAGNOSIS — R931 Abnormal findings on diagnostic imaging of heart and coronary circulation: Secondary | ICD-10-CM | POA: Insufficient documentation

## 2022-07-31 MED ORDER — NITROGLYCERIN 0.4 MG SL SUBL
0.4000 mg | SUBLINGUAL_TABLET | SUBLINGUAL | 0 refills | Status: AC | PRN
Start: 2022-07-31 — End: ?

## 2022-08-10 ENCOUNTER — Encounter (HOSPITAL_COMMUNITY): Payer: Self-pay

## 2022-08-10 ENCOUNTER — Other Ambulatory Visit (HOSPITAL_COMMUNITY): Payer: Self-pay | Admitting: *Deleted

## 2022-08-10 DIAGNOSIS — F3161 Bipolar disorder, current episode mixed, mild: Secondary | ICD-10-CM | POA: Diagnosis not present

## 2022-08-10 DIAGNOSIS — F419 Anxiety disorder, unspecified: Secondary | ICD-10-CM | POA: Diagnosis not present

## 2022-08-10 MED ORDER — METOPROLOL TARTRATE 100 MG PO TABS: ORAL_TABLET | ORAL | 0 refills | Status: DC

## 2022-08-11 ENCOUNTER — Telehealth (HOSPITAL_COMMUNITY): Payer: Self-pay | Admitting: *Deleted

## 2022-08-11 NOTE — Telephone Encounter (Signed)
Patient returning call about her upcoming cardiac imaging study; pt verbalizes understanding of appt date/time, parking situation and where to check in, pre-test NPO status and medications ordered, and verified current allergies; name and call back number provided for further questions should they arise  Lupe Bonner RN Navigator Cardiac Imaging [facility] [phone] office [phone] cell  Patient to take 100mg metoprolol tartrate two hours prior to her cardiac CT scan. She is aware to arrive at 11am. 

## 2022-08-11 NOTE — Telephone Encounter (Signed)
Attempted to call patient regarding upcoming cardiac CT appointment. °Left message on voicemail with name and callback number ° °Tammela Bales RN Navigator Cardiac Imaging °[facility] °[phone] Office °[phone] Cell ° °

## 2022-08-12 ENCOUNTER — Ambulatory Visit (HOSPITAL_BASED_OUTPATIENT_CLINIC_OR_DEPARTMENT_OTHER)
Admission: RE | Admit: 2022-08-12 | Discharge: 2022-08-12 | Disposition: A | Discharge status: Home / Self Care | Payer: PPO | Source: Ambulatory Visit | Attending: Cardiology | Admitting: Cardiology

## 2022-08-12 ENCOUNTER — Other Ambulatory Visit: Payer: Self-pay | Admitting: Cardiology

## 2022-08-12 ENCOUNTER — Ambulatory Visit (HOSPITAL_COMMUNITY)
Admission: RE | Admit: 2022-08-12 | Discharge: 2022-08-12 | Disposition: A | Discharge status: Home / Self Care | Payer: PPO | Source: Ambulatory Visit | Attending: Internal Medicine | Admitting: Internal Medicine

## 2022-08-12 DIAGNOSIS — R5382 Chronic fatigue, unspecified: Secondary | ICD-10-CM

## 2022-08-12 DIAGNOSIS — R0609 Other forms of dyspnea: Secondary | ICD-10-CM | POA: Diagnosis not present

## 2022-08-12 DIAGNOSIS — R931 Abnormal findings on diagnostic imaging of heart and coronary circulation: Secondary | ICD-10-CM

## 2022-08-12 DIAGNOSIS — I2510 Atherosclerotic heart disease of native coronary artery without angina pectoris: Secondary | ICD-10-CM | POA: Diagnosis not present

## 2022-08-12 LAB — POCT I-STAT CREATININE: Creatinine, Ser: 0.5 mg/dL (ref 0.44–1.00)

## 2022-08-12 MED ORDER — IOHEXOL 350 MG/ML SOLN
95.0000 mL | Freq: Once | INTRAVENOUS | Status: AC | PRN
  Administered 2022-08-12: 95 mL via INTRAVENOUS

## 2022-08-12 MED ORDER — NITROGLYCERIN 0.4 MG SL SUBL
SUBLINGUAL_TABLET | SUBLINGUAL | Status: AC
  Filled 2022-08-12: qty 2

## 2022-08-12 MED ORDER — NITROGLYCERIN 0.4 MG SL SUBL
0.8000 mg | SUBLINGUAL_TABLET | Freq: Once | SUBLINGUAL | Status: AC
  Administered 2022-08-12: 0.8 mg via SUBLINGUAL

## 2022-08-12 NOTE — Progress Notes (Signed)
Pt. States, "I feel fine." Pt. Asymptomatic.

## 2022-08-17 ENCOUNTER — Ambulatory Visit (INDEPENDENT_AMBULATORY_CARE_PROVIDER_SITE_OTHER): Payer: PPO | Admitting: Internal Medicine

## 2022-08-17 ENCOUNTER — Encounter: Payer: Self-pay | Admitting: Internal Medicine

## 2022-08-17 VITALS — BP 132/58 | HR 77 | Temp 98.9°F | Resp 16 | Ht 62.0 in | Wt 155.0 lb

## 2022-08-17 DIAGNOSIS — Z7984 Long term (current) use of oral hypoglycemic drugs: Secondary | ICD-10-CM | POA: Diagnosis not present

## 2022-08-17 DIAGNOSIS — E118 Type 2 diabetes mellitus with unspecified complications: Secondary | ICD-10-CM | POA: Diagnosis not present

## 2022-08-17 DIAGNOSIS — F4310 Post-traumatic stress disorder, unspecified: Secondary | ICD-10-CM | POA: Diagnosis not present

## 2022-08-17 DIAGNOSIS — F3161 Bipolar disorder, current episode mixed, mild: Secondary | ICD-10-CM | POA: Diagnosis not present

## 2022-08-17 DIAGNOSIS — F419 Anxiety disorder, unspecified: Secondary | ICD-10-CM | POA: Diagnosis not present

## 2022-08-17 DIAGNOSIS — M797 Fibromyalgia: Secondary | ICD-10-CM | POA: Diagnosis not present

## 2022-08-17 MED ORDER — METFORMIN HCL ER 500 MG PO TB24
500.0000 mg | ORAL_TABLET | Freq: Every day | ORAL | 1 refills | Status: DC
Start: 2022-08-17 — End: 2022-09-15

## 2022-08-17 MED ORDER — PREGABALIN 25 MG PO CAPS
25.0000 mg | ORAL_CAPSULE | Freq: Three times a day (TID) | ORAL | 0 refills | Status: DC
Start: 2022-08-17 — End: 2023-05-19

## 2022-08-17 NOTE — Patient Instructions (Signed)

## 2022-08-17 NOTE — Progress Notes (Signed)
Subjective:  Patient ID: Jessica Guzman, female    DOB: 1960-12-30  Age: 62 y.o. MRN: [id number]  CC: Diabetes   HPI Devani Rain Lansdowne presents for f/up ----  She continues to c/o neuropathy pain and is not getting much symptom relief with gabapentin. She thinks her metformin dose is too high. She complains of fatigue but denies DOE, CP, SOB, edema.  Outpatient Medications Prior to Visit  Medication Sig Dispense Refill   atorvastatin (LIPITOR) 40 MG tablet TAKE 1 TABLET(40 MG) BY MOUTH AT BEDTIME 90 tablet 2   carbamazepine (TEGRETOL) 200 MG tablet Take 1 tablet by mouth 3 (three) times daily. AM, noon, and Bedtime     Continuous Blood Gluc Receiver (FREESTYLE LIBRE 3 READER) DEVI 1 Act by Does not apply route daily. 1 each 3   Continuous Blood Gluc Sensor (FREESTYLE LIBRE 3 SENSOR) MISC 1 Act by Does not apply route daily. Place 1 sensor on the skin every 14 days. Use to check glucose continuously 2 each 5   Glucagon (GVOKE HYPOPEN 2-PACK) 1 MG/0.2ML SOAJ Inject 1 Act into the skin daily as needed. 2 mL 5   levothyroxine (SYNTHROID) 25 MCG tablet Take 1 tablet (25 mcg total) by mouth every morning. 90 tablet 1   metoprolol tartrate (LOPRESSOR) 100 MG tablet Take tablet (100mg ) TWO hours prior to your cardiac CT scan. 1 tablet 0   nitroGLYCERIN (NITROSTAT) 0.4 MG SL tablet Place 1 tablet (0.4 mg total) under the tongue every 5 (five) minutes as needed for chest pain. 50 tablet 0   olmesartan (BENICAR) 40 MG tablet Take 1 tablet (40 mg total) by mouth daily. 90 tablet 1   QUEtiapine (SEROQUEL) 300 MG tablet Take 300 mg by mouth daily.     valACYclovir (VALTREX) 500 MG tablet Take 1 tablet (500 mg total) by mouth daily. 30 tablet 0   metFORMIN (GLUCOPHAGE) 1000 MG tablet TAKE 1 TABLET(1000 MG) BY MOUTH TWICE DAILY WITH A MEAL 180 tablet 0   No facility-administered medications prior to visit.    ROS Review of Systems  Constitutional:  Positive for fatigue. Negative for  chills, diaphoresis, fever and unexpected weight change.  HENT: Negative.    Eyes: Negative.   Respiratory:  Negative for cough, chest tightness, shortness of breath and wheezing.   Cardiovascular:  Negative for chest pain, palpitations and leg swelling.  Gastrointestinal:  Negative for abdominal pain, constipation, diarrhea, nausea and vomiting.  Endocrine: Negative.   Genitourinary: Negative.  Negative for difficulty urinating.  Musculoskeletal:  Negative for arthralgias and myalgias.  Skin: Negative.   Neurological:  Negative for dizziness, weakness and headaches.  Hematological:  Negative for adenopathy. Does not bruise/bleed easily.  Psychiatric/Behavioral: Negative.      Objective:  BP (!) 132/58 (BP Location: Right Arm, Patient Position: Sitting, Cuff Size: Large)   Pulse 77   Temp 98.9 F (37.2 C) (Oral)   Resp 16   Ht 5\' 2"  (1.575 m)   Wt 155 lb (70.3 kg)   SpO2 95%   BMI 28.35 kg/m   BP Readings from Last 3 Encounters:  08/17/22 (!) 132/58  08/12/22 (!) 101/57  06/28/22 138/68    Wt Readings from Last 3 Encounters:  08/17/22 155 lb (70.3 kg)  06/28/22 159 lb (72.1 kg)  06/21/22 161 lb (73 kg)    Physical Exam Vitals reviewed.  HENT:     Nose: Nose normal.     Mouth/Throat:     Mouth: Mucous membranes are moist.  Eyes:     General: No scleral icterus.    Conjunctiva/sclera: Conjunctivae normal.  Cardiovascular:     Rate and Rhythm: Normal rate and regular rhythm.     Heart sounds: Murmur heard.     Systolic murmur is present with a grade of 1/6.     No gallop.  Pulmonary:     Effort: Pulmonary effort is normal.     Breath sounds: No stridor. No wheezing, rhonchi or rales.  Abdominal:     General: Abdomen is flat.     Palpations: There is no mass.     Tenderness: There is no abdominal tenderness. There is no guarding.     Hernia: No hernia is present.  Musculoskeletal:        General: Normal range of motion.     Cervical back: Neck supple.      Right lower leg: No edema.     Left lower leg: No edema.  Lymphadenopathy:     Cervical: No cervical adenopathy.  Skin:    General: Skin is warm and dry.  Neurological:     General: No focal deficit present.     Mental Status: She is alert. Mental status is at baseline.  Psychiatric:        Mood and Affect: Mood normal.        Behavior: Behavior normal.     Lab Results  Component Value Date   WBC 7.7 06/21/2022   HGB 13.7 06/21/2022   HCT 40.4 06/21/2022   PLT 290.0 06/21/2022   GLUCOSE 108 (H) 06/21/2022   CHOL 182 06/21/2022   TRIG 90.0 06/21/2022   HDL 67.10 06/21/2022   LDLCALC 97 06/21/2022   ALT 33 06/21/2022   AST 25 06/21/2022   NA 129 (L) 06/21/2022   K 4.7 06/21/2022   CL 93 (L) 06/21/2022   CREATININE 0.50 08/12/2022   BUN 8 06/21/2022   CO2 28 06/21/2022   TSH 0.85 06/21/2022   INR 1.0 03/09/2020   HGBA1C 6.0 06/21/2022   MICROALBUR <0.7 06/21/2022    CT CORONARY FRACTIONAL FLOW RESERVE FLUID ANALYSIS  Result Date: 08/12/2022 EXAM: CT FFR ANALYSIS CLINICAL DATA:  cad FINDINGS: FFRct analysis was performed on the original cardiac CT angiogram dataset. Diagrammatic representation of the FFRct analysis is provided in a separate PDF document in PACS. This dictation was created using the PDF document and an interactive 3D model of the results. 3D model is not available in the EMR/PACS. Normal FFR range is >0.80. Indeterminate (grey) zone is 0.76-0.80. 1. Left Main: FFR = 1.00 2. LAD: Proximal FFR = 0.95, mid FFR = 0.82, distal FFR = 0.77 3. LCX: Proximal FFR = 0.95, distal FFR = 0.91 4. RCA: Proximal FFR = 0.97, mid FFR =0.95, distal FFR = 0.92 IMPRESSION: 1.  CT FFR analysis showed no significant stenosis. RECOMMENDATIONS: Guideline-directed medical therapy and aggressive risk factor modification for secondary prevention of coronary artery disease. Electronically Signed   By: Thomasene Ripple D.O.   On: 08/12/2022 14:52   CT CORONARY MORPH W/CTA COR W/SCORE W/CA W/CM  &/OR WO/CM  Result Date: 08/12/2022 CLINICAL DATA:  This is a 62 year old female with anginal symptoms. EXAM: Cardiac/Coronary  CTA TECHNIQUE: The patient was scanned on a [employer]. FINDINGS: A 100 kV prospective scan was triggered in the descending thoracic aorta at 111 HU's. Axial non-contrast 3 mm slices were carried out through the heart. The data set was analyzed on a dedicated work station and scored  using the Agatson method. Gantry rotation speed was 250 msecs and collimation was .6 mm. No beta blockade and 0.8 mg of sl NTG was given. The 3D data set was reconstructed in 5% intervals of the 67-82 % of the R-R cycle. Diastolic phases were analyzed on a dedicated work station using MPR, MIP and VRT modes. The patient received 80 cc of contrast. Image quality:Fair with misregistration artifact. Aorta: Normal size.  No calcifications.  No dissection. Aortic Valve:  Trileaflet.  No calcifications. Coronary Arteries:  Normal coronary origin.  Right dominance. RCA is a large dominant artery that gives rise to PDA and PLA. There is no plaque. Left main is a large artery that gives rise to LAD and LCX arteries. LAD is a large vessel. There is a moderate (50-69%) calcified plaque in the proximal portion of the LAD. The mid LAD with moderate focal calcified plaques. The distal LAD with minimal (<24%) calcifications. D1 minimal calcification in the LCX is a non-dominant artery that gives rise to one large OM1 branch. Minimal proximal calcifications. OM1 with no plaques. Coronary Calcium Score: Left main: 0 Left anterior descending artery: 220 Left circumflex artery: 5.19 Right coronary artery: 0 Total: 226 Percentile: 96 Other findings: Normal pulmonary vein drainage into the left atrium. Normal left atrial appendage without a thrombus. Normal size of the pulmonary artery. Small Patent foramen Ovale. IMPRESSION: 1. Coronary calcium score of 226. This was 58 percentile for age and sex matched control. 2.  Normal coronary origin with right dominance. 3. CAD-RADS 3. Moderate stenosis. Consider symptom-guided anti-ischemic pharmacotherapy as well as risk factor modification per guideline directed care. Additional analysis with CT FFR will be submitted. The noncardiac portion of this study will be interpreted in separate report by the radiologist. Electronically Signed   By: Thomasene Ripple D.O.   On: 08/12/2022 14:37    Assessment & Plan:   Type II diabetes mellitus with complication (HCC) -     metFORMIN HCl ER; Take 1 tablet (500 mg total) by mouth daily with breakfast.  Dispense: 90 tablet; Refill: 1 -     Rybelsus; Take 1 tablet (3 mg total) by mouth daily.  Dispense: 30 tablet; Refill: 0  Fibromyalgia -     Pregabalin; Take 1 capsule (25 mg total) by mouth 3 (three) times daily.  Dispense: 90 capsule; Refill: 0     Follow-up: Return in about 3 months (around 11/17/2022).  Sanda Linger, MD

## 2022-08-19 ENCOUNTER — Encounter: Payer: Self-pay | Admitting: Internal Medicine

## 2022-08-19 MED ORDER — RYBELSUS 3 MG PO TABS
3.0000 mg | ORAL_TABLET | Freq: Every day | ORAL | 0 refills | Status: DC
Start: 2022-08-19 — End: 2022-08-23

## 2022-08-20 ENCOUNTER — Telehealth: Payer: Self-pay

## 2022-08-20 NOTE — Telephone Encounter (Signed)
Pharmacy Patient Advocate Encounter   Received notification from CoverMyMeds that prior authorization for Rybelsus is required/requested.   Insurance verification completed.   The patient is insured through [employer] [facility] .   PA submitted to [facility] via PA options: CoverMyMeds Key/confirmation #/EOC [id number] Status is pending

## 2022-08-23 ENCOUNTER — Other Ambulatory Visit: Payer: Self-pay | Admitting: Internal Medicine

## 2022-08-23 DIAGNOSIS — E119 Type 2 diabetes mellitus without complications: Secondary | ICD-10-CM

## 2022-08-23 DIAGNOSIS — E118 Type 2 diabetes mellitus with unspecified complications: Secondary | ICD-10-CM

## 2022-08-23 MED ORDER — RYBELSUS 3 MG PO TABS
3.0000 mg | ORAL_TABLET | Freq: Every day | ORAL | 0 refills | Status: DC
Start: 2022-08-23 — End: 2022-11-18

## 2022-08-24 NOTE — Telephone Encounter (Signed)
Pt need PA on Rybelsus../lmb 

## 2022-08-31 NOTE — Telephone Encounter (Signed)
Error with [employer] in [facility]. Completed PA form and faxed back to insurance.

## 2022-08-31 NOTE — Telephone Encounter (Signed)
Prior Authorization for Rybelsus has been approved with [employer].    PA# [id number] Effective dates: 08/31/22 through 08/31/23

## 2022-09-02 ENCOUNTER — Telehealth (HOSPITAL_COMMUNITY): Payer: Self-pay | Admitting: Professional

## 2022-09-02 ENCOUNTER — Encounter: Payer: Self-pay | Admitting: Internal Medicine

## 2022-09-02 DIAGNOSIS — F319 Bipolar disorder, unspecified: Secondary | ICD-10-CM | POA: Diagnosis not present

## 2022-09-03 ENCOUNTER — Other Ambulatory Visit: Payer: Self-pay | Admitting: Internal Medicine

## 2022-09-03 NOTE — Telephone Encounter (Signed)
Cln returned call and oriented pt to PHP. Pt declined, stating she does not want virtual. Cln encouraged her to try it and see if it would work. Pt declined. She asked why this program remains virtual and cln explained it has provided access to more people as a virtual program. Pt stated she believes she needs to go inpatient but they won't take her "because I don't wanna kill myself." Cln stated this is not necessarily the case and pt provided some details of her mental health diagnosis and treatment history in [place]. She also shared that she had to go on disability due to her illness. She stated she is currently waiting for a ride to [facility] and is seeking an in-person program specifically for socialization. Cln provided phone number for [facility] and told pt about [employer]. Cln encouraged pt to call back if she would like to try this PHP and schedule an assessment. Pt verbalized understanding.

## 2022-09-07 ENCOUNTER — Encounter (HOSPITAL_COMMUNITY): Payer: Self-pay | Admitting: Emergency Medicine

## 2022-09-07 ENCOUNTER — Emergency Department (HOSPITAL_COMMUNITY): Payer: PPO

## 2022-09-07 ENCOUNTER — Emergency Department (HOSPITAL_COMMUNITY)
Admission: EM | Admit: 2022-09-07 | Discharge: 2022-09-07 | Disposition: A | Discharge status: Home / Self Care | Payer: PPO | Attending: Student in an Organized Health Care Education/Training Program | Admitting: Student in an Organized Health Care Education/Training Program

## 2022-09-07 DIAGNOSIS — Z7984 Long term (current) use of oral hypoglycemic drugs: Secondary | ICD-10-CM | POA: Insufficient documentation

## 2022-09-07 DIAGNOSIS — I10 Essential (primary) hypertension: Secondary | ICD-10-CM | POA: Insufficient documentation

## 2022-09-07 DIAGNOSIS — R202 Paresthesia of skin: Secondary | ICD-10-CM | POA: Diagnosis not present

## 2022-09-07 DIAGNOSIS — E871 Hypo-osmolality and hyponatremia: Secondary | ICD-10-CM | POA: Insufficient documentation

## 2022-09-07 DIAGNOSIS — R29818 Other symptoms and signs involving the nervous system: Secondary | ICD-10-CM | POA: Diagnosis not present

## 2022-09-07 DIAGNOSIS — Z79899 Other long term (current) drug therapy: Secondary | ICD-10-CM | POA: Diagnosis not present

## 2022-09-07 DIAGNOSIS — H538 Other visual disturbances: Secondary | ICD-10-CM | POA: Insufficient documentation

## 2022-09-07 DIAGNOSIS — E119 Type 2 diabetes mellitus without complications: Secondary | ICD-10-CM | POA: Insufficient documentation

## 2022-09-07 DIAGNOSIS — R200 Anesthesia of skin: Secondary | ICD-10-CM | POA: Diagnosis not present

## 2022-09-07 LAB — CBC
HCT: 38.2 % (ref 36.0–46.0)
Hemoglobin: 12.7 g/dL (ref 12.0–15.0)
MCH: 28.5 pg (ref 26.0–34.0)
MCHC: 33.2 g/dL (ref 30.0–36.0)
MCV: 85.8 fL (ref 80.0–100.0)
Platelets: 276 10*3/uL (ref 150–400)
RBC: 4.45 MIL/uL (ref 3.87–5.11)
RDW: 12.8 % (ref 11.5–15.5)
WBC: 11.1 10*3/uL — ABNORMAL HIGH (ref 4.0–10.5)
nRBC: 0 % (ref 0.0–0.2)

## 2022-09-07 LAB — COMPREHENSIVE METABOLIC PANEL
ALT: 28 U/L (ref 0–44)
AST: 27 U/L (ref 15–41)
Albumin: 4.2 g/dL (ref 3.5–5.0)
Alkaline Phosphatase: 82 U/L (ref 38–126)
Anion gap: 8 (ref 5–15)
BUN: 13 mg/dL (ref 6–20)
CO2: 25 mmol/L (ref 22–32)
Calcium: 9.3 mg/dL (ref 8.9–10.3)
Chloride: 97 mmol/L — ABNORMAL LOW (ref 98–111)
Creatinine, Ser: 0.53 mg/dL (ref 0.44–1.00)
GFR, Estimated: >60 mL/min (ref >60)
Glucose, Bld: 114 mg/dL — ABNORMAL HIGH (ref 70–99)
Potassium: 4.4 mmol/L (ref 3.5–5.1)
Sodium: 130 mmol/L — ABNORMAL LOW (ref 135–145)
Total Bilirubin: 0.5 mg/dL (ref 0.3–1.2)
Total Protein: 7.3 g/dL (ref 6.5–8.1)

## 2022-09-07 LAB — DIFFERENTIAL
Abs Immature Granulocytes: 0.03 10*3/uL (ref 0.00–0.07)
Basophils Absolute: 0 10*3/uL (ref 0.0–0.1)
Basophils Relative: 0 %
Eosinophils Absolute: 0.2 10*3/uL (ref 0.0–0.5)
Eosinophils Relative: 2 %
Immature Granulocytes: 0 %
Lymphocytes Relative: 18 %
Lymphs Abs: 2 10*3/uL (ref 0.7–4.0)
Monocytes Absolute: 0.8 10*3/uL (ref 0.1–1.0)
Monocytes Relative: 7 %
Neutro Abs: 8.1 10*3/uL — ABNORMAL HIGH (ref 1.7–7.7)
Neutrophils Relative %: 73 %

## 2022-09-07 LAB — I-STAT CHEM 8, ED
BUN: 16 mg/dL (ref 6–20)
Calcium, Ion: 1.22 mmol/L (ref 1.15–1.40)
Chloride: 97 mmol/L — ABNORMAL LOW (ref 98–111)
Creatinine, Ser: 0.6 mg/dL (ref 0.44–1.00)
Glucose, Bld: 110 mg/dL — ABNORMAL HIGH (ref 70–99)
HCT: 40 % (ref 36.0–46.0)
Hemoglobin: 13.6 g/dL (ref 12.0–15.0)
Potassium: 4.6 mmol/L (ref 3.5–5.1)
Sodium: 131 mmol/L — ABNORMAL LOW (ref 135–145)
TCO2: 27 mmol/L (ref 22–32)

## 2022-09-07 LAB — URINALYSIS, ROUTINE W REFLEX MICROSCOPIC
Bacteria, UA: NONE SEEN
Bilirubin Urine: NEGATIVE
Glucose, UA: NEGATIVE mg/dL
Hgb urine dipstick: NEGATIVE
Ketones, ur: NEGATIVE mg/dL
Nitrite: NEGATIVE
Protein, ur: NEGATIVE mg/dL
Specific Gravity, Urine: 1.015 (ref 1.005–1.030)
pH: 6 (ref 5.0–8.0)

## 2022-09-07 LAB — CBG MONITORING, ED: Glucose-Capillary: 114 mg/dL — ABNORMAL HIGH (ref 70–99)

## 2022-09-07 LAB — APTT: aPTT: 32 seconds (ref 24–36)

## 2022-09-07 LAB — PROTIME-INR
INR: 1 (ref 0.8–1.2)
Prothrombin Time: 13.6 seconds (ref 11.4–15.2)

## 2022-09-07 LAB — RAPID URINE DRUG SCREEN, HOSP PERFORMED
Amphetamines: NOT DETECTED
Barbiturates: NOT DETECTED
Benzodiazepines: NOT DETECTED
Cocaine: NOT DETECTED
Opiates: NOT DETECTED
Tetrahydrocannabinol: NOT DETECTED

## 2022-09-07 LAB — ETHANOL: Alcohol, Ethyl (B): <10 mg/dL (ref <10)

## 2022-09-07 MED ORDER — LORAZEPAM 0.5 MG PO TABS
0.5000 mg | ORAL_TABLET | Freq: Once | ORAL | Status: AC
  Administered 2022-09-07: 0.5 mg via ORAL
  Filled 2022-09-07: qty 1

## 2022-09-07 MED ORDER — SODIUM CHLORIDE 0.9 % IV BOLUS: 1000.0000 mL | Freq: Once | INTRAVENOUS | Status: DC

## 2022-09-07 MED ORDER — SODIUM CHLORIDE 0.9 % IV BOLUS
1000.0000 mL | Freq: Once | INTRAVENOUS | Status: AC
  Administered 2022-09-07: 1000 mL via INTRAVENOUS

## 2022-09-07 NOTE — ED Provider Notes (Signed)
[facility]ERGENCY DEPARTMENT AT [facility] Provider Note   CSN: [id number] Arrival date & time: 09/07/22  1215     History  Chief Complaint  Patient presents with   Blurred Vision   Numbness    Jessica Guzman is a 62 y.o. female with past medical history of bipolar disorder, type 2 diabetes, hypertension, hyperlipidemia, PTSD who presents to the ED complaining paresthesias to the face that started this morning between 10 and 11:30 AM.  States that when she woke up this morning she was in her normal state of health with no symptoms apart from feeling fatigued.  She was playing the piano when she noticed that it was difficult to focus in on them notes that she music and she noticed paresthesias on both upper cheeks up to the forehead bilaterally.  No associated headache but states "my eyes feel like they're being strangled." No recent illness, nausea, vomiting, diarrhea, cough, congestion, or fever.  Denies chest pain, shortness of breath, or abdominal pain.  Sister at bedside states that patient has been ambulating abnormally but states that this started over a week ago when her medication was changed from Lexapro to Wellbutrin.  Patient says in the past she has had similar symptoms when she was taking the Lexapro as well.  On initial exam, patient says that she is not currently having any of the symptoms that she was experiencing earlier. No previous strokes. No anticoagulants.       Home Medications Prior to Admission medications   Medication Sig Start Date End Date Taking? Authorizing Provider  atorvastatin (LIPITOR) 40 MG tablet TAKE 1 TABLET(40 MG) BY MOUTH AT BEDTIME 05/13/22   Etta Grandchild, MD  carbamazepine (TEGRETOL) 200 MG tablet Take 1 tablet by mouth 3 (three) times daily. AM, noon, and Bedtime 07/12/21   [provider]  Continuous Blood Gluc Receiver (FREESTYLE LIBRE 3 READER) DEVI 1 Act by Does not apply route daily. 06/21/22   Etta Grandchild, MD   Continuous Glucose Sensor (FREESTYLE LIBRE 2 SENSOR) MISC APPLY EVERY 14 DAYS. 08/23/22   Etta Grandchild, MD  Glucagon (GVOKE HYPOPEN 2-PACK) 1 MG/0.2ML SOAJ Inject 1 Act into the skin daily as needed. 06/25/22   Etta Grandchild, MD  levothyroxine (SYNTHROID) 25 MCG tablet Take 1 tablet (25 mcg total) by mouth every morning. 06/21/22   Etta Grandchild, MD  metFORMIN (GLUCOPHAGE-XR) 500 MG 24 hr tablet Take 1 tablet (500 mg total) by mouth daily with breakfast. 08/17/22   Etta Grandchild, MD  metoprolol tartrate (LOPRESSOR) 100 MG tablet Take tablet (100mg ) TWO hours prior to your cardiac CT scan. 08/10/22   O'Neal, Ronnald Ramp, MD  nitroGLYCERIN (NITROSTAT) 0.4 MG SL tablet Place 1 tablet (0.4 mg total) under the tongue every 5 (five) minutes as needed for chest pain. 07/31/22   Etta Grandchild, MD  olmesartan (BENICAR) 40 MG tablet Take 1 tablet (40 mg total) by mouth daily. 06/21/22   Etta Grandchild, MD  pregabalin (LYRICA) 25 MG capsule Take 1 capsule (25 mg total) by mouth 3 (three) times daily. 08/17/22   Etta Grandchild, MD  QUEtiapine (SEROQUEL) 300 MG tablet Take 300 mg by mouth daily. 01/16/21   [provider]  Semaglutide (RYBELSUS) 3 MG TABS Take 1 tablet (3 mg total) by mouth daily. 08/23/22   Etta Grandchild, MD  valACYclovir (VALTREX) 500 MG tablet Take 1 tablet (500 mg total) by mouth daily. 06/03/22   Yetta Barre,  Bernadene Bell, MD      Allergies    Codeine    Review of Systems   Review of Systems  All other systems reviewed and are negative.   Physical Exam Updated Vital Signs BP (!) 150/77   Pulse 77   Temp 98.4 F (36.9 C) (Oral)   Resp 14   Ht 5\' 2"  (1.575 m)   Wt 67.1 kg   SpO2 99%   BMI 27.07 kg/m  Physical Exam Vitals and nursing note reviewed.  Constitutional:      General: She is not in acute distress.    Appearance: Normal appearance. She is not ill-appearing, toxic-appearing or diaphoretic.  HENT:     Head: Normocephalic and atraumatic.     Mouth/Throat:      Mouth: Mucous membranes are moist.  Eyes:     General: No visual field deficit or scleral icterus.    Extraocular Movements: Extraocular movements intact.     Conjunctiva/sclera: Conjunctivae normal.     Pupils: Pupils are equal, round, and reactive to light.  Cardiovascular:     Rate and Rhythm: Normal rate and regular rhythm.     Heart sounds: No murmur heard. Pulmonary:     Effort: Pulmonary effort is normal.     Breath sounds: Normal breath sounds.  Abdominal:     General: Abdomen is flat.     Palpations: Abdomen is soft.     Tenderness: There is no abdominal tenderness. There is no guarding or rebound.  Musculoskeletal:        General: No deformity. Normal range of motion.     Cervical back: Normal range of motion and neck supple. No rigidity or tenderness.     Right lower leg: No edema.     Left lower leg: No edema.  Skin:    General: Skin is warm and dry.     Capillary Refill: Capillary refill takes less than 2 seconds.     Findings: No rash.  Neurological:     General: No focal deficit present.     Mental Status: She is alert and oriented to person, place, and time.     GCS: GCS eye subscore is 4. GCS verbal subscore is 5. GCS motor subscore is 6.     Cranial Nerves: Cranial nerves 2-12 are intact. No cranial nerve deficit, dysarthria or facial asymmetry.     Sensory: Sensation is intact.     Motor: Motor function is intact. No weakness, tremor, atrophy, abnormal muscle tone, seizure activity or pronator drift.     Coordination: Coordination is intact.     Comments: Vision grossly intact, ambulating with ease  Psychiatric:        Mood and Affect: Mood is anxious. Affect is tearful.        Speech: Speech normal.        Behavior: Behavior is cooperative.     ED Results / Procedures / Treatments   Labs (all labs ordered are listed, but only abnormal results are displayed) Labs Reviewed  COMPREHENSIVE METABOLIC PANEL - Abnormal; Notable for the following  components:      Result Value   Sodium 130 (*)    Chloride 97 (*)    Glucose, Bld 114 (*)    All other components within normal limits  URINALYSIS, ROUTINE W REFLEX MICROSCOPIC - Abnormal; Notable for the following components:   Leukocytes,Ua SMALL (*)    All other components within normal limits  DIFFERENTIAL - Abnormal; Notable for the following components:  Neutro Abs 8.1 (*)    All other components within normal limits  CBC - Abnormal; Notable for the following components:   WBC 11.1 (*)    All other components within normal limits  I-STAT CHEM 8, ED - Abnormal; Notable for the following components:   Sodium 131 (*)    Chloride 97 (*)    Glucose, Bld 110 (*)    All other components within normal limits  CBG MONITORING, ED - Abnormal; Notable for the following components:   Glucose-Capillary 114 (*)    All other components within normal limits  PROTIME-INR  APTT  ETHANOL  RAPID URINE DRUG SCREEN, HOSP PERFORMED    EKG None  Radiology MR BRAIN WO CONTRAST  Result Date: 09/07/2022 CLINICAL DATA:  Neuro deficit, acute, stroke suspected EXAM: MRI HEAD WITHOUT CONTRAST TECHNIQUE: Multiplanar, multiecho pulse sequences of the brain and surrounding structures were obtained without intravenous contrast. COMPARISON:  Same day CT head. FINDINGS: Brain: No acute infarction, hemorrhage, hydrocephalus, extra-axial collection or mass lesion. Vascular: Major arterial flow voids are maintained at the skull base. Skull and upper cervical spine: Normal marrow signal. Sinuses/Orbits: Clear sinuses.  No acute orbital findings. Other: No mastoid effusions. IMPRESSION: Normal brain MRI.  No acute abnormality. Electronically Signed   By: Feliberto Harts M.D.   On: 09/07/2022 17:12   CT HEAD WO CONTRAST  Result Date: 09/07/2022 CLINICAL DATA:  Right-sided facial numbness and tingling with loss of vision. EXAM: CT HEAD WITHOUT CONTRAST TECHNIQUE: Contiguous axial images were obtained from the base  of the skull through the vertex without intravenous contrast. RADIATION DOSE REDUCTION: This exam was performed according to the departmental dose-optimization program which includes automated exposure control, adjustment of the mA and/or kV according to patient size and/or use of iterative reconstruction technique. COMPARISON:  03/09/2020 head CT FINDINGS: Brain: No evidence of acute infarction, hemorrhage, hydrocephalus, extra-axial collection or mass lesion/mass effect. Vascular: No hyperdense vessel or unexpected calcification. Skull: Normal. Negative for fracture or focal lesion. Sinuses/Orbits: No acute finding. IMPRESSION: Normal head CT. Electronically Signed   By: Tiburcio Pea M.D.   On: 09/07/2022 13:14    Procedures Procedures    Medications Ordered in ED Medications  LORazepam (ATIVAN) tablet 0.5 mg (0.5 mg Oral Given 09/07/22 1500)  sodium chloride 0.9 % bolus 1,000 mL (1,000 mLs Intravenous New Bag/Given 09/07/22 1500)    ED Course/ Medical Decision Making/ A&P                            Medical Decision Making Amount and/or Complexity of Data Reviewed Labs: ordered. Decision-making details documented in ED Course. Radiology: ordered. Decision-making details documented in ED Course. ECG/medicine tests: ordered. Decision-making details documented in ED Course.  Risk Prescription drug management.   Medical Decision Making:   Kismet Reeder is a 62 y.o. female who presented to the ED today with paresthesias detailed above.    Additional history discussed with patient's family/caregivers.  Patient's presentation is complicated by their history of bipolar disorder, hypertension, hyperlipidemia, diabetes.  Complete initial physical exam performed, notably the patient was in no acute distress.  Alert and oriented.  Equal strength bilaterally.  Cranial nerves intact.  Normal speech.    Reviewed and confirmed nursing documentation for past medical history, family history,  social history.    Initial Assessment:   With the patient's presentation, differential diagnosis includes but is not limited to CVA/TIA, ICH, anxiety, electrolyte disturbance, infectious process,  polypharmacy, medication side effect, dehydration, hypoglycemia.  This is most consistent with an acute complicated illness  Initial Plan:  Stroke Workup Pt currently with normal neuro exam so code stroke not called Objective evaluation as below reviewed   Initial Study Results:   Laboratory  All laboratory results reviewed without evidence of clinically relevant pathology.   Exceptions include: Sodium 130, chloride 97, WBC 11.1  EKG EKG was reviewed independently.  Normal sinus rhythm.  No acute ST-T changes.  No STEMI.  Radiology:  All images reviewed independently. Agree with radiology report at this time.   MR BRAIN WO CONTRAST  Result Date: 09/07/2022 CLINICAL DATA:  Neuro deficit, acute, stroke suspected EXAM: MRI HEAD WITHOUT CONTRAST TECHNIQUE: Multiplanar, multiecho pulse sequences of the brain and surrounding structures were obtained without intravenous contrast. COMPARISON:  Same day CT head. FINDINGS: Brain: No acute infarction, hemorrhage, hydrocephalus, extra-axial collection or mass lesion. Vascular: Major arterial flow voids are maintained at the skull base. Skull and upper cervical spine: Normal marrow signal. Sinuses/Orbits: Clear sinuses.  No acute orbital findings. Other: No mastoid effusions. IMPRESSION: Normal brain MRI.  No acute abnormality. Electronically Signed   By: Feliberto Harts M.D.   On: 09/07/2022 17:12   CT HEAD WO CONTRAST  Result Date: 09/07/2022 CLINICAL DATA:  Right-sided facial numbness and tingling with loss of vision. EXAM: CT HEAD WITHOUT CONTRAST TECHNIQUE: Contiguous axial images were obtained from the base of the skull through the vertex without intravenous contrast. RADIATION DOSE REDUCTION: This exam was performed according to the departmental  dose-optimization program which includes automated exposure control, adjustment of the mA and/or kV according to patient size and/or use of iterative reconstruction technique. COMPARISON:  03/09/2020 head CT FINDINGS: Brain: No evidence of acute infarction, hemorrhage, hydrocephalus, extra-axial collection or mass lesion/mass effect. Vascular: No hyperdense vessel or unexpected calcification. Skull: Normal. Negative for fracture or focal lesion. Sinuses/Orbits: No acute finding. IMPRESSION: Normal head CT. Electronically Signed   By: Tiburcio Pea M.D.   On: 09/07/2022 13:14   CT CORONARY MORPH W/CTA COR W/SCORE W/CA W/CM &/OR WO/CM  Addendum Date: 08/20/2022   ADDENDUM REPORT: 08/20/2022 15:56 EXAM: OVER-READ INTERPRETATION  CT CHEST The following report is an over-read performed by radiologist Dr. Alver Fisher [facility] on 08/20/2022. This over-read does not include interpretation of cardiac or coronary anatomy or pathology. The interpretation by the cardiologist is attached. COMPARISON:  07/30/2022. FINDINGS: Scout view is unremarkable.  No acute extracardiac findings. IMPRESSION: No extracardiac findings requiring follow-up. Electronically Signed   By: Leanna Battles M.D.   On: 08/20/2022 15:56   Result Date: 08/20/2022 CLINICAL DATA:  This is a 62 year old female with anginal symptoms. EXAM: Cardiac/Coronary  CTA TECHNIQUE: The patient was scanned on a [employer]. FINDINGS: A 100 kV prospective scan was triggered in the descending thoracic aorta at 111 HU's. Axial non-contrast 3 mm slices were carried out through the heart. The data set was analyzed on a dedicated work station and scored using the Agatson method. Gantry rotation speed was 250 msecs and collimation was .6 mm. No beta blockade and 0.8 mg of sl NTG was given. The 3D data set was reconstructed in 5% intervals of the 67-82 % of the R-R cycle. Diastolic phases were analyzed on a dedicated work station using MPR,  MIP and VRT modes. The patient received 80 cc of contrast. Image quality:Fair with misregistration artifact. Aorta: Normal size.  No calcifications.  No dissection. Aortic Valve:  Trileaflet.  No calcifications. Coronary Arteries:  Normal coronary origin.  Right dominance. RCA is a large dominant artery that gives rise to PDA and PLA. There is no plaque. Left main is a large artery that gives rise to LAD and LCX arteries. LAD is a large vessel. There is a moderate (50-69%) calcified plaque in the proximal portion of the LAD. The mid LAD with moderate focal calcified plaques. The distal LAD with minimal (<24%) calcifications. D1 minimal calcification in the LCX is a non-dominant artery that gives rise to one large OM1 branch. Minimal proximal calcifications. OM1 with no plaques. Coronary Calcium Score: Left main: 0 Left anterior descending artery: 220 Left circumflex artery: 5.19 Right coronary artery: 0 Total: 226 Percentile: 96 Other findings: Normal pulmonary vein drainage into the left atrium. Normal left atrial appendage without a thrombus. Normal size of the pulmonary artery. Small Patent foramen Ovale. IMPRESSION: 1. Coronary calcium score of 226. This was 62 percentile for age and sex matched control. 2. Normal coronary origin with right dominance. 3. CAD-RADS 3. Moderate stenosis. Consider symptom-guided anti-ischemic pharmacotherapy as well as risk factor modification per guideline directed care. Additional analysis with CT FFR will be submitted. The noncardiac portion of this study will be interpreted in separate report by the radiologist. Electronically Signed: By: Thomasene Ripple D.O. On: 08/12/2022 14:37   CT CORONARY FRACTIONAL FLOW RESERVE FLUID ANALYSIS  Result Date: 08/12/2022 EXAM: CT FFR ANALYSIS CLINICAL DATA:  cad FINDINGS: FFRct analysis was performed on the original cardiac CT angiogram dataset. Diagrammatic representation of the FFRct analysis is provided in a separate PDF document in PACS.  This dictation was created using the PDF document and an interactive 3D model of the results. 3D model is not available in the EMR/PACS. Normal FFR range is >0.80. Indeterminate (grey) zone is 0.76-0.80. 1. Left Main: FFR = 1.00 2. LAD: Proximal FFR = 0.95, mid FFR = 0.82, distal FFR = 0.77 3. LCX: Proximal FFR = 0.95, distal FFR = 0.91 4. RCA: Proximal FFR = 0.97, mid FFR =0.95, distal FFR = 0.92 IMPRESSION: 1.  CT FFR analysis showed no significant stenosis. RECOMMENDATIONS: Guideline-directed medical therapy and aggressive risk factor modification for secondary prevention of coronary artery disease. Electronically Signed   By: Thomasene Ripple D.O.   On: 08/12/2022 14:52      Final Assessment and Plan:   62 year old female presents to the ED complaining of facial paresthesias and blurred vision that began this morning.  On initial assessment, she has a normal neuroexam with no focal deficits or active symptoms does code stroke was not called.  CT brain normal.  Labs reveal a hyponatremia which appears chronic.  No other significant electrolyte disturbance.  Labs otherwise unremarkable.  Patient notes that symptoms are intermittent.  With this, MRI brain was obtained for further assessment.  No acute findings on MRI brain either.  Patient did recently start Wellbutrin after switching from Lexapro.  She notes that she has intermittently had the symptoms on different psychiatric medications in the past.  With reassuring workup, very low suspicion for acute neurologic process.  Discussed all findings in detail with patient who expressed understanding of plan.  She is ambulatory in the ED with ease and has remained neurologically intact.  With reassuring exam and findings, will discharge her home for close PCP and neurology follow-up.  Patient agreeable with this plan.  Strict ED return precautions given, all questions answered, and stable for discharge.   Clinical Impression:  1. Paresthesia   2.  Blurred vision,  bilateral   3. Hyponatremia      Discharge           Final Clinical Impression(s) / ED Diagnoses Final diagnoses:  Paresthesia  Blurred vision, bilateral  Hyponatremia    Rx / DC Orders ED Discharge Orders     None         Tonette Lederer, PA-C 09/07/22 1731    Lowther, Amy, DO 09/09/22 1223

## 2022-09-07 NOTE — Discharge Instructions (Signed)
Thank you for letting us take care of you today.  Overall, your workup was reassuring. The CT scan and MRI of your brain did not show a stroke. Your sodium was slightly low so we gave you fluids for this. There were no other significant abnormalities on your labs today.  Please follow-up with your cardiologist as scheduled.  Follow-up with your PCP within the next week to discuss your ED visit today.  I also recommend seeing neurology as they may want to do more testing if you continue to have the numbness and tingling to your face.  Discuss with your PCP if you should continue your current medication regimen. If you continue to have vision issues, schedule an appointment with your eye doctor for an exam. For new or worsening symptoms including weakness on one side of your body, drooping of her mouth, chest pain, shortness of breath, fever, or other new, concerning symptoms, return to the nearest ED for reevaluation.

## 2022-09-07 NOTE — ED Triage Notes (Signed)
Pt reports waking up normal but tired. Pt was playing piano this morning around 10 and then around 1130 states the notes were out of focus. Reports intermittent facial numbness that starts under her eyes and moves up her face. No arm dip, equal strength in arms and legs.

## 2022-09-13 ENCOUNTER — Telehealth: Payer: Self-pay

## 2022-09-13 NOTE — Telephone Encounter (Signed)
Transition Care Management Follow-up Telephone Call Date of discharge and from where: 09/07/2022 [facility] How have you been since you were released from the hospital? Patient is feeling better, but is still experiencing some anxiety. Any questions or concerns? No  Items Reviewed: Did the pt receive and understand the discharge instructions provided? Yes  Medications obtained and verified? Yes  Other? No  Any new allergies since your discharge? No  Dietary orders reviewed? Yes Do you have support at home? Yes   Follow up appointments reviewed:  PCP Hospital f/u appt confirmed? Yes  Scheduled to see Avanell Shackleton, NP-C on 09/15/2022 @ [facility] f/u appt confirmed?  Patient will call and make appointment with her Neurologist  Scheduled to see  on  @ . Are transportation arrangements needed? No  If their condition worsens, is the pt aware to call PCP or go to the Emergency Dept.? Yes Was the patient provided with contact information for the PCP's office or ED? Yes Was to pt encouraged to call back with questions or concerns? Yes  Claude Swendsen [facility]  [facility] Community Resource Care Guide   ??[email or web address]  ?? [phone]   Website: [email or web address]  [email or web address]

## 2022-09-14 DIAGNOSIS — F3161 Bipolar disorder, current episode mixed, mild: Secondary | ICD-10-CM | POA: Diagnosis not present

## 2022-09-14 DIAGNOSIS — F319 Bipolar disorder, unspecified: Secondary | ICD-10-CM | POA: Diagnosis not present

## 2022-09-15 ENCOUNTER — Ambulatory Visit (INDEPENDENT_AMBULATORY_CARE_PROVIDER_SITE_OTHER): Payer: PPO | Admitting: Family Medicine

## 2022-09-15 ENCOUNTER — Encounter: Payer: Self-pay | Admitting: Family Medicine

## 2022-09-15 VITALS — BP 114/68 | HR 76 | Temp 98.4°F | Ht 62.0 in | Wt 148.0 lb

## 2022-09-15 DIAGNOSIS — Z7984 Long term (current) use of oral hypoglycemic drugs: Secondary | ICD-10-CM | POA: Diagnosis not present

## 2022-09-15 DIAGNOSIS — E118 Type 2 diabetes mellitus with unspecified complications: Secondary | ICD-10-CM | POA: Diagnosis not present

## 2022-09-15 DIAGNOSIS — E871 Hypo-osmolality and hyponatremia: Secondary | ICD-10-CM | POA: Diagnosis not present

## 2022-09-15 DIAGNOSIS — F3112 Bipolar disorder, current episode manic without psychotic features, moderate: Secondary | ICD-10-CM | POA: Diagnosis not present

## 2022-09-15 MED ORDER — METFORMIN HCL 500 MG PO TABS
500.0000 mg | ORAL_TABLET | Freq: Two times a day (BID) | ORAL | 1 refills | Status: DC
Start: 2022-09-15 — End: 2023-03-22

## 2022-09-15 NOTE — Patient Instructions (Signed)
I have refilled your metformin 500 mg tablets to take twice daily.  This was approved by Dr. Yetta Barre.  Continue to monitor your blood sugars regularly.  Please follow-up with Dr. Yetta Barre as scheduled in September or sooner if needed.

## 2022-09-15 NOTE — Progress Notes (Signed)
Subjective:     Patient ID: Jessica Guzman, female    DOB: 10-14-1960, 62 y.o.   MRN: [id number]  Chief Complaint  Patient presents with   Follow-up    Pt states that she would like to discuss about metformin.   Foot Injury    Pt states that she has stepped on something recently and there is a small black mark on her left foot (lower)    Foot Injury     Discussed the use of AI scribe software for clinical note transcription with the patient, who gave verbal consent to proceed.  History of Present Illness         C/o having a stressful year. Death of 2 people close to her. States she is battling depression and that she is usually more manic.  Goes to a day program so she can be around people.   DM- states she is taking her old metformin 1,000 mg tablets and breaking them in half. Taking 500 mg bid.  States she has not started Rybelsus due to being "overwhelmed".  States her BS has been stable.   States she has a visit with cardiologist tomorrow.   She saw psychiatrist yesterday Hilbert Corrigan, NP  States she stepped on something last week and wants her left foot checked. No numbness, tingling or pain. Her foot did not bleed.   States she is taking gabapentin and did not switch to pregabalin.   Denies SI    Health Maintenance Due  Topic Date Due   OPHTHALMOLOGY EXAM  07/23/2022    Past Medical History:  Diagnosis Date   Bipolar 1 disorder (HCC)    Diabetes mellitus without complication (HCC)    Hyperlipidemia    Hypertension    PTSD (post-traumatic stress disorder)     Past Surgical History:  Procedure Laterality Date   PCOS     TUBAL LIGATION      Family History  Problem Relation Age of Onset   Alzheimer's disease Mother    Diabetes Sister    High Cholesterol Sister    High blood pressure Sister    Bipolar disorder Brother    Stroke Maternal Grandfather    Diabetes Maternal Grandfather    Pancreatic cancer Paternal Grandfather      Social History   Socioeconomic History   Marital status: Divorced    Spouse name: Not on file   Number of children: Not on file   Years of education: Not on file   Highest education level: Bachelor's degree (e.g., BA, AB, BS)  Occupational History   Not on file  Tobacco Use   Smoking status: Some Days    Types: Cigarettes    Last attempt to quit: 02/01/2019    Years since quitting: 3.6   Smokeless tobacco: Never  Vaping Use   Vaping Use: Never used  Substance and Sexual Activity   Alcohol use: Not Currently    Comment: rarely   Drug use: Never   Sexual activity: Not Currently  Other Topics Concern   Not on file  Social History Narrative   Lives alone   Right handed   Caffeine: 2-4 cups of coffee   Social Determinants of Health   Financial Resource Strain: Low Risk  (05/18/2022)   Overall Financial Resource Strain (CARDIA)    Difficulty of Paying Living Expenses: Not hard at all  Food Insecurity: No Food Insecurity (05/18/2022)   Hunger Vital Sign    Worried About Programme researcher, broadcasting/film/video in  the Last Year: Never true    Ran Out of Food in the Last Year: Never true  Transportation Needs: No Transportation Needs (05/18/2022)   PRAPARE - Administrator, Civil Service (Medical): No    Lack of Transportation (Non-Medical): No  Physical Activity: Insufficiently Active (05/18/2022)   Exercise Vital Sign    Days of Exercise per Week: 4 days    Minutes of Exercise per Session: 30 min  Stress: Stress Concern Present (05/18/2022)   [employer] of Occupational Health - Occupational Stress Questionnaire    Feeling of Stress : To some extent  Social Connections: Socially Isolated (05/18/2022)   Social Connection and Isolation Panel [NHANES]    Frequency of Communication with Friends and Family: More than three times a week    Frequency of Social Gatherings with Friends and Family: More than three times a week    Attends Religious Services: Never    Doctor, general practice or Organizations: No    Attends Banker Meetings: Never    Marital Status: Divorced  Catering manager Violence: Not At Risk (05/18/2022)   Humiliation, Afraid, Rape, and Kick questionnaire    Fear of Current or Ex-Partner: No    Emotionally Abused: No    Physically Abused: No    Sexually Abused: No    Outpatient Medications Prior to Visit  Medication Sig Dispense Refill   atorvastatin (LIPITOR) 40 MG tablet TAKE 1 TABLET(40 MG) BY MOUTH AT BEDTIME 90 tablet 2   carbamazepine (TEGRETOL) 200 MG tablet Take 1 tablet by mouth 3 (three) times daily. AM, noon, and Bedtime     Continuous Blood Gluc Receiver (FREESTYLE LIBRE 3 READER) DEVI 1 Act by Does not apply route daily. 1 each 3   Continuous Glucose Sensor (FREESTYLE LIBRE 2 SENSOR) MISC APPLY EVERY 14 DAYS. 2 each 5   Glucagon (GVOKE HYPOPEN 2-PACK) 1 MG/0.2ML SOAJ Inject 1 Act into the skin daily as needed. 2 mL 5   levothyroxine (SYNTHROID) 25 MCG tablet Take 1 tablet (25 mcg total) by mouth every morning. 90 tablet 1   metoprolol tartrate (LOPRESSOR) 100 MG tablet Take tablet (100mg ) TWO hours prior to your cardiac CT scan. 1 tablet 0   nitroGLYCERIN (NITROSTAT) 0.4 MG SL tablet Place 1 tablet (0.4 mg total) under the tongue every 5 (five) minutes as needed for chest pain. 50 tablet 0   olmesartan (BENICAR) 40 MG tablet Take 1 tablet (40 mg total) by mouth daily. 90 tablet 1   pregabalin (LYRICA) 25 MG capsule Take 1 capsule (25 mg total) by mouth 3 (three) times daily. 90 capsule 0   QUEtiapine (SEROQUEL) 300 MG tablet Take 300 mg by mouth daily.     Semaglutide (RYBELSUS) 3 MG TABS Take 1 tablet (3 mg total) by mouth daily. 30 tablet 0   valACYclovir (VALTREX) 500 MG tablet Take 1 tablet (500 mg total) by mouth daily. 30 tablet 0   metFORMIN (GLUCOPHAGE-XR) 500 MG 24 hr tablet Take 1 tablet (500 mg total) by mouth daily with breakfast. 90 tablet 1   No facility-administered medications prior to visit.    Allergies   Allergen Reactions   Codeine Nausea And Vomiting    Review of Systems  Constitutional:  Negative for chills and fever.  Eyes:  Negative for blurred vision and double vision.  Respiratory:  Negative for shortness of breath.   Cardiovascular:  Negative for chest pain, palpitations and leg swelling.  Gastrointestinal:  Negative for diarrhea,  nausea and vomiting.  Neurological:  Negative for dizziness and focal weakness.  Psychiatric/Behavioral:  Positive for depression. Negative for substance abuse and suicidal ideas.        Objective:    Physical Exam Constitutional:      General: She is not in acute distress.    Appearance: She is not ill-appearing.  HENT:     Mouth/Throat:     Mouth: Mucous membranes are moist.     Pharynx: Oropharynx is clear.  Eyes:     Extraocular Movements: Extraocular movements intact.     Conjunctiva/sclera: Conjunctivae normal.  Cardiovascular:     Rate and Rhythm: Normal rate.  Pulmonary:     Effort: Pulmonary effort is normal.  Musculoskeletal:     Cervical back: Normal range of motion and neck supple.     Right lower leg: No edema.     Left lower leg: No edema.     Left foot: Normal. Normal range of motion and normal capillary refill. No swelling or tenderness.     Comments: Skin intact. No erythema or sign of infection   Skin:    General: Skin is warm and dry.  Neurological:     General: No focal deficit present.     Mental Status: She is alert and oriented to person, place, and time.     Cranial Nerves: No facial asymmetry.     Motor: No weakness or tremor.     Gait: Gait normal.  Psychiatric:        Attention and Perception: Attention normal.        Mood and Affect: Mood is depressed. Affect is tearful.        Speech: Speech normal.        Behavior: Behavior normal.        Thought Content: Thought content normal. Thought content does not include suicidal ideation.      BP 114/68 (BP Location: Left Arm, Patient Position: Sitting,  Cuff Size: Normal)   Pulse 76   Temp 98.4 F (36.9 C) (Oral)   Ht 5\' 2"  (1.575 m)   Wt 148 lb (67.1 kg)   SpO2 98%   BMI 27.07 kg/m  Wt Readings from Last 3 Encounters:  09/15/22 148 lb (67.1 kg)  09/07/22 148 lb (67.1 kg)  08/17/22 155 lb (70.3 kg)       Assessment & Plan:   Problem List Items Addressed This Visit       Endocrine   Type II diabetes mellitus with complication (HCC) - Primary   Relevant Medications   metFORMIN (GLUCOPHAGE) 500 MG tablet     Other   Bipolar disorder, curr episode manic w/o psychotic features, moderate (HCC)   Other Visit Diagnoses     Chronic hyponatremia          She has not started Rybelsus as prescribed.  Stopped metformin 500 mg 24 hour tablet and restarted metformin 500 mg bid on her own. Would like a refill.  Foot exam is normal. No sign of infection or injury.  Reviewed ED notes and results. Chronic hyponatremia.  Follow up with PCP  I have discontinued Max Romano. Dinino's metFORMIN. I am also having her start on metFORMIN. Additionally, I am having her maintain her QUEtiapine, carbamazepine, atorvastatin, valACYclovir, FreeStyle Libre 3 Reader, levothyroxine, olmesartan, Gvoke HypoPen 2-Pack, nitroGLYCERIN, metoprolol tartrate, pregabalin, FreeStyle Libre 2 Sensor, and Rybelsus.  Meds ordered this encounter  Medications   metFORMIN (GLUCOPHAGE) 500 MG tablet    Sig: Take 1  tablet (500 mg total) by mouth 2 (two) times daily with a meal.    Dispense:  60 tablet    Refill:  1    Order Specific Question:   Supervising Provider    Answer:   Hillard Danker A [[id number]]

## 2022-09-16 ENCOUNTER — Ambulatory Visit: Payer: PPO | Attending: Internal Medicine | Admitting: Internal Medicine

## 2022-09-16 ENCOUNTER — Encounter: Payer: Self-pay | Admitting: Internal Medicine

## 2022-09-16 DIAGNOSIS — E785 Hyperlipidemia, unspecified: Secondary | ICD-10-CM

## 2022-09-16 MED ORDER — ATORVASTATIN CALCIUM 80 MG PO TABS
80.0000 mg | ORAL_TABLET | Freq: Every day | ORAL | 3 refills | Status: DC
Start: 2022-09-16 — End: 2023-10-26

## 2022-09-16 NOTE — Patient Instructions (Signed)
Medication Instructions:   INCREASE atorvastatin to 80mg  daily  ** you can take 2 of the 40mg  tablets until you run out ** new prescription for new dose sent to pharmacy   *If you need a refill on your cardiac medications before your next appointment, please call your pharmacy*   Lab Work: FASTING lab work to check cholesterol in 3-4 months  If you have labs (blood work) drawn today and your tests are completely normal, you will receive your results only by: MyChart Message (if you have MyChart) OR A paper copy in the mail If you have any lab test that is abnormal or we need to change your treatment, we will call you to review the results.    Follow-Up: At [facility], you and your health needs are our priority.  As part of our continuing mission to provide you with exceptional heart care, we have created designated Provider Care Teams.  These Care Teams include your primary Cardiologist (physician) and Advanced Practice Providers (APPs -  Physician Assistants and Nurse Practitioners) who all work together to provide you with the care you need, when you need it.  We recommend signing up for the patient portal called "MyChart".  Sign up information is provided on this After Visit Summary.  MyChart is used to connect with patients for Virtual Visits (Telemedicine).  Patients are able to view lab/test results, encounter notes, upcoming appointments, etc.  Non-urgent messages can be sent to your provider as well.   To learn more about what you can do with MyChart, go to [email or web address].    Your next appointment:    6 months with Dr. Wyline Mood  ** call in September/October for a January 2025 appointment

## 2022-09-16 NOTE — Progress Notes (Signed)
Cardiology Office Note:    Date:  09/16/2022   ID:  Jessica Guzman, DOB [birth year], MRN [id number]  PCP:  Etta Grandchild, MD   [facility] Providers Cardiologist:  None     Referring MD: Etta Grandchild, MD   No chief complaint on file. Elevated CAC  History of Present Illness:    Jessica Guzman is a 62 y.o. female with a hx of PTSD, well controlled DM2, HTN, referral for elevated CAC. EKG in July showed NSR. She smoked on and off for 20 years.She walks and swims. No chest pressure or SOB. She had an episode with acute SOB prior to her CT scan.   Past Medical History:  Diagnosis Date   Bipolar 1 disorder (HCC)    Diabetes mellitus without complication (HCC)    Hyperlipidemia    Hypertension    PTSD (post-traumatic stress disorder)     Past Surgical History:  Procedure Laterality Date   PCOS     TUBAL LIGATION      Current Medications: Current Outpatient Medications on File Prior to Visit  Medication Sig Dispense Refill   aspirin 81 MG chewable tablet Chew by mouth daily.     atorvastatin (LIPITOR) 40 MG tablet TAKE 1 TABLET(40 MG) BY MOUTH AT BEDTIME 90 tablet 2   b complex vitamins capsule Take 1 capsule by mouth daily.     buPROPion (WELLBUTRIN XL) 150 MG 24 hr tablet Take 150 mg by mouth daily.     busPIRone (BUSPAR) 10 MG tablet Take 10 mg by mouth 2 (two) times daily.     carbamazepine (TEGRETOL) 200 MG tablet Take 1 tablet by mouth 3 (three) times daily. AM, noon, and Bedtime     cholecalciferol (VITAMIN D3) 10 MCG/ML LIQD oral liquid Take 400 Units by mouth daily.     Continuous Blood Gluc Receiver (FREESTYLE LIBRE 3 READER) DEVI 1 Act by Does not apply route daily. 1 each 3   Continuous Glucose Sensor (FREESTYLE LIBRE 2 SENSOR) MISC APPLY EVERY 14 DAYS. 2 each 5   gabapentin (NEURONTIN) 100 MG capsule Take 100-200 mg by mouth 2 (two) times daily as needed.     Glucagon (GVOKE HYPOPEN 2-PACK) 1 MG/0.2ML SOAJ Inject 1 Act into the skin  daily as needed. 2 mL 5   levothyroxine (SYNTHROID) 25 MCG tablet Take 1 tablet (25 mcg total) by mouth every morning. 90 tablet 1   metFORMIN (GLUCOPHAGE) 500 MG tablet Take 1 tablet (500 mg total) by mouth 2 (two) times daily with a meal. 60 tablet 1   metoprolol tartrate (LOPRESSOR) 100 MG tablet Take tablet (100mg ) TWO hours prior to your cardiac CT scan. 1 tablet 0   nitroGLYCERIN (NITROSTAT) 0.4 MG SL tablet Place 1 tablet (0.4 mg total) under the tongue every 5 (five) minutes as needed for chest pain. 50 tablet 0   olmesartan (BENICAR) 40 MG tablet Take 1 tablet (40 mg total) by mouth daily. 90 tablet 1   pregabalin (LYRICA) 25 MG capsule Take 1 capsule (25 mg total) by mouth 3 (three) times daily. 90 capsule 0   QUEtiapine (SEROQUEL) 300 MG tablet Take 300 mg by mouth daily.     Semaglutide (RYBELSUS) 3 MG TABS Take 1 tablet (3 mg total) by mouth daily. 30 tablet 0   valACYclovir (VALTREX) 500 MG tablet Take 1 tablet (500 mg total) by mouth daily. 30 tablet 0   No current facility-administered medications on file prior to visit.  Allergies:   Codeine   Social History   Socioeconomic History   Marital status: Divorced    Spouse name: Not on file   Number of children: Not on file   Years of education: Not on file   Highest education level: Bachelor's degree (e.g., BA, AB, BS)  Occupational History   Not on file  Tobacco Use   Smoking status: Some Days    Current packs/day: 0.00    Types: Cigarettes    Last attempt to quit: 02/01/2019    Years since quitting: 3.6   Smokeless tobacco: Never  Vaping Use   Vaping status: Never Used  Substance and Sexual Activity   Alcohol use: Not Currently    Comment: rarely   Drug use: Never   Sexual activity: Not Currently  Other Topics Concern   Not on file  Social History Narrative   Lives alone   Right handed   Caffeine: 2-4 cups of coffee   Social Determinants of Health   Financial Resource Strain: Low Risk  (05/18/2022)    Overall Financial Resource Strain (CARDIA)    Difficulty of Paying Living Expenses: Not hard at all  Food Insecurity: No Food Insecurity (05/18/2022)   Hunger Vital Sign    Worried About Running Out of Food in the Last Year: Never true    Ran Out of Food in the Last Year: Never true  Transportation Needs: No Transportation Needs (05/18/2022)   PRAPARE - Administrator, Civil Service (Medical): No    Lack of Transportation (Non-Medical): No  Physical Activity: Insufficiently Active (05/18/2022)   Exercise Vital Sign    Days of Exercise per Week: 4 days    Minutes of Exercise per Session: 30 min  Stress: Stress Concern Present (05/18/2022)   [employer] of Occupational Health - Occupational Stress Questionnaire    Feeling of Stress : To some extent  Social Connections: Socially Isolated (05/18/2022)   Social Connection and Isolation Panel [NHANES]    Frequency of Communication with Friends and Family: More than three times a week    Frequency of Social Gatherings with Friends and Family: More than three times a week    Attends Religious Services: Never    Database administrator or Organizations: No    Attends Engineer, structural: Never    Marital Status: Divorced     Family History: The patient's family history includes Alzheimer's disease in her mother; Bipolar disorder in her brother; Diabetes in her maternal grandfather and sister; High Cholesterol in her sister; High blood pressure in her sister; Pancreatic cancer in her paternal grandfather; Stroke in her maternal grandfather. No premature CAD  ROS:   Please see the history of present illness.     All other systems reviewed and are negative.  EKGs/Labs/Other Studies Reviewed:    The following studies were reviewed today:       Recent Labs: 06/21/2022: TSH 0.85 09/07/2022: ALT 28; BUN 16; Creatinine, Ser 0.60; Hemoglobin 12.7; Platelets 276; Potassium 4.6; Sodium 131   Recent Lipid Panel     Component Value Date/Time   CHOL 182 06/21/2022 0934   TRIG 90.0 06/21/2022 0934   HDL 67.10 06/21/2022 0934   CHOLHDL 3 06/21/2022 0934   VLDL 18.0 06/21/2022 0934   LDLCALC 97 06/21/2022 0934     Risk Assessment/Calculations:    Physical Exam:    VS:  Vitals:   09/16/22 1323  BP: 138/68  Pulse: 76  SpO2: 98%  Wt Readings from Last 3 Encounters:  09/15/22 148 lb (67.1 kg)  09/07/22 148 lb (67.1 kg)  08/17/22 155 lb (70.3 kg)     GEN:  Well nourished, well developed in no acute distress HEENT: Normal NECK: No JVD CARDIAC: RRR, no murmurs, rubs, gallops RESPIRATORY:  Clear to auscultation without rales, wheezing or rhonchi  ABDOMEN: Soft, non-tender, non-distended MUSCULOSKELETAL:  No edema; No deformity  SKIN: Warm and dry NEUROLOGIC:  Alert and oriented x 3 PSYCHIATRIC:  Normal affect   ASSESSMENT:    Elevated CAC - asymptomatic - 226, 96th percentile, FFR negative except distal LAD which is not uncommon. - LDL 97 mg/dL  PLAN:    In order of problems listed above:  Increase lipitor 80 mg daily Fasting lipids in 3 months Continue  asa 81 mg daily Follow up 6 months           Medication Adjustments/Labs and Tests Ordered: Current medicines are reviewed at length with the patient today.  Concerns regarding medicines are outlined above.  No orders of the defined types were placed in this encounter.  No orders of the defined types were placed in this encounter.   There are no Patient Instructions on file for this visit.   Signed, Maisie Fus, MD  09/16/2022 12:56 PM    [facility]

## 2022-09-17 ENCOUNTER — Other Ambulatory Visit: Payer: Self-pay | Admitting: Internal Medicine

## 2022-09-17 DIAGNOSIS — E119 Type 2 diabetes mellitus without complications: Secondary | ICD-10-CM

## 2022-10-06 DIAGNOSIS — F319 Bipolar disorder, unspecified: Secondary | ICD-10-CM | POA: Diagnosis not present

## 2022-10-12 DIAGNOSIS — F411 Generalized anxiety disorder: Secondary | ICD-10-CM | POA: Diagnosis not present

## 2022-10-12 DIAGNOSIS — F319 Bipolar disorder, unspecified: Secondary | ICD-10-CM | POA: Diagnosis not present

## 2022-10-12 DIAGNOSIS — Z79899 Other long term (current) drug therapy: Secondary | ICD-10-CM | POA: Diagnosis not present

## 2022-10-20 DIAGNOSIS — F319 Bipolar disorder, unspecified: Secondary | ICD-10-CM | POA: Diagnosis not present

## 2022-10-20 DIAGNOSIS — F411 Generalized anxiety disorder: Secondary | ICD-10-CM | POA: Diagnosis not present

## 2022-10-21 ENCOUNTER — Encounter (INDEPENDENT_AMBULATORY_CARE_PROVIDER_SITE_OTHER): Payer: Self-pay

## 2022-10-27 ENCOUNTER — Ambulatory Visit: Payer: PPO | Admitting: Neurology

## 2022-10-27 ENCOUNTER — Encounter: Payer: Self-pay | Admitting: Neurology

## 2022-10-27 VITALS — BP 125/75 | HR 67 | Ht 64.0 in | Wt 153.0 lb

## 2022-10-27 DIAGNOSIS — F3112 Bipolar disorder, current episode manic without psychotic features, moderate: Secondary | ICD-10-CM | POA: Diagnosis not present

## 2022-10-27 DIAGNOSIS — F411 Generalized anxiety disorder: Secondary | ICD-10-CM | POA: Diagnosis not present

## 2022-10-27 DIAGNOSIS — F5105 Insomnia due to other mental disorder: Secondary | ICD-10-CM

## 2022-10-27 DIAGNOSIS — F99 Mental disorder, not otherwise specified: Secondary | ICD-10-CM

## 2022-10-27 DIAGNOSIS — F319 Bipolar disorder, unspecified: Secondary | ICD-10-CM | POA: Diagnosis not present

## 2022-10-27 NOTE — Progress Notes (Signed)
Provider:  Melvyn Novas, MD  Primary Care Physician:  Etta Grandchild, MD [address] [place] [address]     Referring Provider: Etta Grandchild, Md [address] [place],  [place] [address]          Chief Complaint according to patient   Patient presents with:     New problem , previously seen here as a Sleep Patient - and no longer followed here in sleep clinic           HISTORY OF PRESENT ILLNESS:  Jessica Guzman is a 62 y.o. female patient who is here for a new problem on  10/27/2022 after an ED Visit on 09-07-2022:  .  Chief concern according to patient :  I was playing the piano in AM and suddenly could not read the sheet music. I went to the ED, my face felt numb and they thought my psychiatric medications were the cause , the insomnia was worsening.     I had been highly anxious for weeks, waking up with panic attacks out of sleep , felt numbness and palpitations.   Part of the manifestation of panic attacks and anxiety attacks have been the feeling of electric shock sensations paresthesias and numbness but also palpitations. The patient's sister advised the emergency room staff that her sisters medication had been changed from Lexapro to Wellbutrin.  In the week prior she had similar symptoms when she was taking the Lexapro.  The symptoms resolved during the emergency room visit there was no history of pulmonary stroke so she was not on anticoagulants, she had been started in May  to wean off carbamazepine from tid to hs only -Tegretol  has helped her to sleep better, butter interfered  with Seroquel. No longer on a morning  time dose.  The patient has been taking 300 mg of Seroquel at night,  She now takes Wellbutrin 150 mg once a day, she is on a B complex vitamin she has been taking Lipitor, baby aspirin, BuSpar 20 mg twice a day and Neurontin 200 mg by mouth at bedtime.    All symptoms ave improved since her ED visit. Melvyn Novas, MD       Jessica Guzman is a 62 year old Caucasian female patient and was seen here upon a sleep Consultation on 01/28/2021 from Dr. Chauncy Passy office, FNP.  Chief concern according to patient : " I was asked to see a neurologist for a sleep study"   Guastella Rochelle Sepulvado  has a past medical history of Bipolar 1 disorder ( age 27 -HCC), psychotic breakthrough, medication induced weight gain ( Depakote) ,  post partum depression ,Diabetes mellitus without complication (HCC), Hyperlipidemia, Hypertension, Lithium medication, and PTSD (post-traumatic stress disorder).   The patient has chronic insomnia and cyclic, too. Episodic insomnia changes related to mania or depression. 3-4 weeks ago last anxiety augmented and she needs more gabapentin.  Sleep relevant medical history: Snoring, Nocturia, Sleep walking, Hip pain, fallen out of bed from fighting  or running away in her dreams, no Tonsillectomy, nasal septum deviation.    Family medical /sleep history: brother and sister on CPAP with OSA, bipolar disorder related insomnia, sleep walkers, obesity in both siblings.     Social history:  Patient is disabled, former Advertising account planner, and lives in a household alone with a cat.  Family status is divorced , with 2 adult children in [place].  Tobacco use: quit 2 weeks ago.  ETOH use ; rare,  Caffeine intake in form of Coffee( 2 cups a day) Soda( /) Tea ( /) or energy drinks. Regular exercise in form of walking.     Sleep habits are as follows: The patient's dinner time is between 5-6 PM. The patient goes to bed at 10-11 PM and continues ( currently for the last 3 weeks ) to sleep for 7-8 hours. Goes to bathroom 1-2 times.  The preferred sleep position is supine - due to hip pain-, with the support of 1 pillow.  Dreams are reportedly frequent/vivid- and upsetting.  7  AM is the usual rise time. When manic, its as early as 5 AM.  The patient wakes up spontaneously at 6.30-  7 AM>.  She reports not feeling  refreshed or restored in AM, with symptoms such as dry mouth, morning stiffness and aches and residual fatigue. Naps are taken in form of "resting - not sleeping- mind keeps racing.     Review of Systems: Out of a complete 14 system review, the patient complains of only the following symptoms, and all other reviewed systems are negative.:   Bipolar disorder, anxiety , depression and panic disorder, PTSD.  Fatigue, sleepiness , snoring, fragmented sleep, Insomnia,    How likely are you to doze in the following situations: 0 = not likely, 1 = slight chance, 2 = moderate chance, 3 = high chance   Sitting and Reading? Watching Television? Sitting inactive in a public place (theater or meeting)? As a passenger in a car for an hour without a break? Lying down in the afternoon when circumstances permit? Sitting and talking to someone? Sitting quietly after lunch without alcohol? In a car, while stopped for a few minutes in traffic?   Total = 8/ 24 points   FSS endorsed at 30/ 63 points.   Social History   Socioeconomic History   Marital status: Divorced    Spouse name: Not on file   Number of children: Not on file   Years of education: Not on file   Highest education level: Bachelor's degree (e.g., BA, AB, BS)  Occupational History   Not on file  Tobacco Use   Smoking status: Some Days    Current packs/day: 0.00    Types: Cigarettes    Last attempt to quit: 02/01/2019    Years since quitting: 3.7   Smokeless tobacco: Never  Vaping Use   Vaping status: Never Used  Substance and Sexual Activity   Alcohol use: Not Currently    Comment: rarely   Drug use: Never   Sexual activity: Not Currently  Other Topics Concern   Not on file  Social History Narrative   Lives alone   Right handed   Caffeine: 2-4 cups of coffee   Social Determinants of Health   Financial Resource Strain: Low Risk  (05/18/2022)   Overall Financial Resource Strain (CARDIA)    Difficulty of Paying Living  Expenses: Not hard at all  Food Insecurity: No Food Insecurity (05/18/2022)   Hunger Vital Sign    Worried About Running Out of Food in the Last Year: Never true    Ran Out of Food in the Last Year: Never true  Transportation Needs: No Transportation Needs (05/18/2022)   PRAPARE - Administrator, Civil Service (Medical): No    Lack of Transportation (Non-Medical): No  Physical Activity: Insufficiently Active (05/18/2022)   Exercise Vital Sign    Days of  Exercise per Week: 4 days    Minutes of Exercise per Session: 30 min  Stress: Stress Concern Present (05/18/2022)   [employer] of Occupational Health - Occupational Stress Questionnaire    Feeling of Stress : To some extent  Social Connections: Socially Isolated (05/18/2022)   Social Connection and Isolation Panel [NHANES]    Frequency of Communication with Friends and Family: More than three times a week    Frequency of Social Gatherings with Friends and Family: More than three times a week    Attends Religious Services: Never    Database administrator or Organizations: No    Attends Engineer, structural: Never    Marital Status: Divorced    Family History  Problem Relation Age of Onset   Alzheimer's disease Mother    Diabetes Sister    High Cholesterol Sister    High blood pressure Sister    Bipolar disorder Brother    Stroke Maternal Grandfather    Diabetes Maternal Grandfather    Pancreatic cancer Paternal Grandfather     Past Medical History:  Diagnosis Date   Bipolar 1 disorder (HCC)    Diabetes mellitus without complication (HCC)    Hyperlipidemia    Hypertension    PTSD (post-traumatic stress disorder)     Past Surgical History:  Procedure Laterality Date   PCOS     TUBAL LIGATION       Current Outpatient Medications on File Prior to Visit  Medication Sig Dispense Refill   aspirin 81 MG chewable tablet Chew by mouth daily.     atorvastatin (LIPITOR) 80 MG tablet Take 1 tablet (80 mg  total) by mouth at bedtime. 90 tablet 3   b complex vitamins capsule Take 1 capsule by mouth daily.     buPROPion (WELLBUTRIN XL) 150 MG 24 hr tablet Take 150 mg by mouth daily.     busPIRone (BUSPAR) 10 MG tablet Take 20 mg by mouth 2 (two) times daily.     carbamazepine (TEGRETOL) 200 MG tablet Take 300 tablets by mouth 3 (three) times daily. at Bedtime     cholecalciferol (VITAMIN D3) 10 MCG/ML LIQD oral liquid Take 400 Units by mouth daily.     Continuous Blood Gluc Receiver (FREESTYLE LIBRE 3 READER) DEVI 1 Act by Does not apply route daily. 1 each 3   Continuous Glucose Sensor (FREESTYLE LIBRE 2 SENSOR) MISC APPLY EVERY 14 DAYS. 2 each 5   gabapentin (NEURONTIN) 100 MG capsule Take 100-200 mg by mouth 2 (two) times daily as needed.     levothyroxine (SYNTHROID) 25 MCG tablet Take 1 tablet (25 mcg total) by mouth every morning. 90 tablet 1   metFORMIN (GLUCOPHAGE) 500 MG tablet Take 1 tablet (500 mg total) by mouth 2 (two) times daily with a meal. 60 tablet 1   metoprolol tartrate (LOPRESSOR) 100 MG tablet Take tablet (100mg ) TWO hours prior to your cardiac CT scan. 1 tablet 0   nitroGLYCERIN (NITROSTAT) 0.4 MG SL tablet Place 1 tablet (0.4 mg total) under the tongue every 5 (five) minutes as needed for chest pain. 50 tablet 0   olmesartan (BENICAR) 40 MG tablet Take 1 tablet (40 mg total) by mouth daily. 90 tablet 1   pregabalin (LYRICA) 25 MG capsule Take 1 capsule (25 mg total) by mouth 3 (three) times daily. 90 capsule 0   QUEtiapine (SEROQUEL) 300 MG tablet Take 300 mg by mouth daily.     valACYclovir (VALTREX) 500 MG tablet Take 1  tablet (500 mg total) by mouth daily. 30 tablet 0   Glucagon (GVOKE HYPOPEN 2-PACK) 1 MG/0.2ML SOAJ Inject 1 Act into the skin daily as needed. (Patient not taking: Reported on 10/27/2022) 2 mL 5   Semaglutide (RYBELSUS) 3 MG TABS Take 1 tablet (3 mg total) by mouth daily. (Patient not taking: Reported on 10/27/2022) 30 tablet 0   No current  facility-administered medications on file prior to visit.    Allergies  Allergen Reactions   Codeine Nausea And Vomiting     DIAGNOSTIC DATA (LABS, IMAGING, TESTING) - I reviewed patient records, labs, notes, testing and imaging myself where available.  Lab Results  Component Value Date   WBC 11.1 (H) 09/07/2022   HGB 12.7 09/07/2022   HCT 38.2 09/07/2022   MCV 85.8 09/07/2022   PLT 276 09/07/2022      Component Value Date/Time   NA 131 (L) 09/07/2022 1332   NA 136 (A) 02/02/2021 0000   K 4.6 09/07/2022 1332   CL 97 (L) 09/07/2022 1332   CO2 25 09/07/2022 1316   GLUCOSE 110 (H) 09/07/2022 1332   BUN 16 09/07/2022 1332   BUN 11 02/02/2021 0000   CREATININE 0.60 09/07/2022 1332   CALCIUM 9.3 09/07/2022 1316   PROT 7.3 09/07/2022 1316   ALBUMIN 4.2 09/07/2022 1316   AST 27 09/07/2022 1316   ALT 28 09/07/2022 1316   ALKPHOS 82 09/07/2022 1316   BILITOT 0.5 09/07/2022 1316   GFRNONAA >60 09/07/2022 1316   GFRAA 105 02/02/2021 0000   Lab Results  Component Value Date   CHOL 182 06/21/2022   HDL 67.10 06/21/2022   LDLCALC 97 06/21/2022   TRIG 90.0 06/21/2022   CHOLHDL 3 06/21/2022   Lab Results  Component Value Date   HGBA1C 6.0 06/21/2022   No results found for: "VITAMINB12" Lab Results  Component Value Date   TSH 0.85 06/21/2022    PHYSICAL EXAM:  Today's Vitals   10/27/22 1402  BP: 125/75  Pulse: 67  Weight: 153 lb (69.4 kg)  Height: 5\' 4"  (1.626 m)   Body mass index is 26.26 kg/m.   Wt Readings from Last 3 Encounters:  10/27/22 153 lb (69.4 kg)  09/16/22 149 lb 6.4 oz (67.8 kg)  09/15/22 148 lb (67.1 kg)     Ht Readings from Last 3 Encounters:  10/27/22 5\' 4"  (1.626 m)  09/16/22 5\' 2"  (1.575 m)  09/15/22 5\' 2"  (1.575 m)      General: The patient is awake, alert and appears not in acute distress. The patient is well groomed. Head: Normocephalic, atraumatic. Neck is supple. Mallampati 2-3,  neck circumference:16 inches . Nasal airflow not  fully patent.  Retrognathia is seen.  Dental status: partial dentures.  Cardiovascular:  Regular rate and cardiac rhythm by pulse,  without distended neck veins. Respiratory: Lungs are clear to auscultation.  Skin:  Without evidence of ankle edema, or rash. Trunk: The patient's posture is erect.   Neurologic exam : The patient is awake and alert, oriented to place and time.   Memory subjective described as foggy.  Attention span & concentration ability appears limited.  Speech is fluent,  without  dysarthria, dysphonia or aphasia.  Mood and affect are anxious.   Cranial nerves: no loss of smell or taste reported  Pupils are equal and briskly reactive to light.  Funduscopic exam deferred.  Extraocular movements in vertical and horizontal planes were intact and without nystagmus. No Diplopia. Visual fields by finger perimetry are  intact. Hearing was intact to soft voice and finger rubbing.    Facial sensation intact to fine touch.  Facial motor strength is symmetric and tongue and uvula move midline.  Neck ROM : rotation, tilt and flexion extension were normal for age and shoulder shrug was symmetrical.    Motor exam:  Symmetric bulk, tone and ROM.   Normal tone without cog wheeling, symmetric grip strength . DTR symmetric.    Sensory:  Fine touch, pinprick and vibration were tested  and  normal.  Proprioception tested in the upper extremities was normal. Coordination: Rapid alternating movements in the fingers/hands were of normal speed.  The Finger-to-nose maneuver was intact. Gait and station: Hip pain reported- Patient could rise unassisted from a seated position, walked without assistive device.     ASSESSMENT AND PLAN 62 y.o.  female patient of Dr Yetta Barre here with:    1) cyclic and chronic insomnia related to mental illness ( bipolar with psychotic episodes)   This is followed by psychiatry and is not a neurological diagnosis, not an organic sleep disorder. Sleeping currently  much better, but continues to be a rapid cycler. Used to be on Depakote. Used to be on lithium.   2)  Dyseasthesias are part of Somatization in panic disorder, anxiety.   This is followed by psychiatry.   3) Blurred vision has resolved. No need to follow up with neurology or sleep clinic.    CC: I will share my notes with her current PCP.   Psychiatrist : Allayne Stack, NP   After spending a total time of  32  minutes face to face and additional time for physical and neurologic examination, review of laboratory studies,  personal review of imaging studies, reports and results of other testing and review of referral information / records as far as provided in visit,   Electronically signed by: Melvyn Novas, MD 10/27/2022 2:21 PM  [facility] Board certified by The [employer] of Sleep Medicine and Diplomate of the [employer] of Sleep Medicine. Board certified In Neurology through the ABPN, Fellow of the [employer] of Neurology.

## 2022-11-09 DIAGNOSIS — F319 Bipolar disorder, unspecified: Secondary | ICD-10-CM | POA: Diagnosis not present

## 2022-11-09 DIAGNOSIS — F411 Generalized anxiety disorder: Secondary | ICD-10-CM | POA: Diagnosis not present

## 2022-11-11 ENCOUNTER — Other Ambulatory Visit: Payer: Self-pay | Admitting: Family Medicine

## 2022-11-11 DIAGNOSIS — E118 Type 2 diabetes mellitus with unspecified complications: Secondary | ICD-10-CM

## 2022-11-18 ENCOUNTER — Encounter: Payer: Self-pay | Admitting: Internal Medicine

## 2022-11-18 ENCOUNTER — Ambulatory Visit (INDEPENDENT_AMBULATORY_CARE_PROVIDER_SITE_OTHER): Payer: PPO | Admitting: Internal Medicine

## 2022-11-18 VITALS — BP 126/68 | HR 78 | Temp 98.5°F | Resp 16 | Ht 64.0 in | Wt 143.0 lb

## 2022-11-18 DIAGNOSIS — E039 Hypothyroidism, unspecified: Secondary | ICD-10-CM

## 2022-11-18 DIAGNOSIS — Z23 Encounter for immunization: Secondary | ICD-10-CM

## 2022-11-18 DIAGNOSIS — E118 Type 2 diabetes mellitus with unspecified complications: Secondary | ICD-10-CM

## 2022-11-18 DIAGNOSIS — I10 Essential (primary) hypertension: Secondary | ICD-10-CM | POA: Diagnosis not present

## 2022-11-18 DIAGNOSIS — Z7984 Long term (current) use of oral hypoglycemic drugs: Secondary | ICD-10-CM | POA: Diagnosis not present

## 2022-11-18 LAB — BASIC METABOLIC PANEL
BUN: 13 mg/dL (ref 6–23)
CO2: 28 meq/L (ref 19–32)
Calcium: 9.9 mg/dL (ref 8.4–10.5)
Chloride: 98 meq/L (ref 96–112)
Creatinine, Ser: 0.58 mg/dL (ref 0.40–1.20)
GFR: 98.71 mL/min (ref >60.00)
Glucose, Bld: 131 mg/dL — ABNORMAL HIGH (ref 70–99)
Potassium: 5.1 meq/L (ref 3.5–5.1)
Sodium: 135 meq/L (ref 135–145)

## 2022-11-18 LAB — TSH: TSH: 0.78 u[IU]/mL (ref 0.35–5.50)

## 2022-11-18 LAB — HEMOGLOBIN A1C: Hgb A1c MFr Bld: 6.3 % (ref 4.6–6.5)

## 2022-11-18 MED ORDER — LEVOTHYROXINE SODIUM 25 MCG PO TABS
25.0000 ug | ORAL_TABLET | Freq: Every morning | ORAL | 1 refills | Status: DC
Start: 2022-11-18 — End: 2023-02-21

## 2022-11-18 NOTE — Progress Notes (Signed)
Subjective:  Patient ID: Jessica Guzman, female    DOB: 01/23/1961  Age: 63 y.o. MRN: [id number]  CC: Diabetes, Hypothyroidism, and Hypertension   HPI Dia Luc Krigbaum presents for f/up ----  Discussed the use of AI scribe software for clinical note transcription with the patient, who gave verbal consent to proceed.  History of Present Illness   The patient, with a history of thyroid disease and mental health concerns, reports fatigue. They deny any episodes of weakness, dizziness, lightheadedness, or syncope. No other thyroid-related symptoms such as constipation, weight changes, or appetite changes were reported.  The patient's sleep has improved due to recent medication adjustments. Specifically, the dosage of Tegretol was significantly reduced due to its interaction with Seroquel, which is now taken at a dose of 300-400mg  depending on the patient's condition.  The patient had their last eye exam the previous year and has another scheduled for late October. They deny any pain in their calves or issues with their feet.  They report feeling the best they have in a long time, attributing this improvement to better sleep resulting from the adjusted medication regimen.  The patient has been monitoring their blood pressure and stopped taking readings due to anxiety about the results. They deny any gastrointestinal issues such as diarrhea or constipation.       Outpatient Medications Prior to Visit  Medication Sig Dispense Refill   aspirin 81 MG chewable tablet Chew by mouth daily.     atorvastatin (LIPITOR) 80 MG tablet Take 1 tablet (80 mg total) by mouth at bedtime. 90 tablet 3   b complex vitamins capsule Take 1 capsule by mouth daily.     buPROPion (WELLBUTRIN XL) 150 MG 24 hr tablet Take 150 mg by mouth daily.     busPIRone (BUSPAR) 10 MG tablet Take 20 mg by mouth 2 (two) times daily.     carbamazepine (TEGRETOL) 200 MG tablet Take 300 tablets by mouth 3 (three) times  daily. at Bedtime     cholecalciferol (VITAMIN D3) 10 MCG/ML LIQD oral liquid Take 400 Units by mouth daily.     Continuous Blood Gluc Receiver (FREESTYLE LIBRE 3 READER) DEVI 1 Act by Does not apply route daily. 1 each 3   Continuous Glucose Sensor (FREESTYLE LIBRE 2 SENSOR) MISC APPLY EVERY 14 DAYS. 2 each 5   gabapentin (NEURONTIN) 100 MG capsule Take 100-200 mg by mouth 2 (two) times daily as needed.     Glucagon (GVOKE HYPOPEN 2-PACK) 1 MG/0.2ML SOAJ Inject 1 Act into the skin daily as needed. 2 mL 5   metFORMIN (GLUCOPHAGE) 500 MG tablet Take 1 tablet (500 mg total) by mouth 2 (two) times daily with a meal. 60 tablet 1   nitroGLYCERIN (NITROSTAT) 0.4 MG SL tablet Place 1 tablet (0.4 mg total) under the tongue every 5 (five) minutes as needed for chest pain. 50 tablet 0   olmesartan (BENICAR) 40 MG tablet Take 1 tablet (40 mg total) by mouth daily. 90 tablet 1   pregabalin (LYRICA) 25 MG capsule Take 1 capsule (25 mg total) by mouth 3 (three) times daily. 90 capsule 0   QUEtiapine (SEROQUEL) 300 MG tablet Take 300 mg by mouth daily.     valACYclovir (VALTREX) 500 MG tablet Take 1 tablet (500 mg total) by mouth daily. 30 tablet 0   levothyroxine (SYNTHROID) 25 MCG tablet Take 1 tablet (25 mcg total) by mouth every morning. 90 tablet 1   metoprolol tartrate (LOPRESSOR) 100 MG tablet Take  tablet (100mg ) TWO hours prior to your cardiac CT scan. 1 tablet 0   Semaglutide (RYBELSUS) 3 MG TABS Take 1 tablet (3 mg total) by mouth daily. 30 tablet 0   No facility-administered medications prior to visit.    ROS Review of Systems  Constitutional:  Positive for fatigue. Negative for chills and unexpected weight change.  HENT: Negative.    Eyes: Negative.   Respiratory: Negative.  Negative for cough, chest tightness, shortness of breath and wheezing.   Cardiovascular:  Negative for chest pain, palpitations and leg swelling.  Gastrointestinal:  Negative for abdominal pain, diarrhea, nausea and  vomiting.  Genitourinary: Negative.  Negative for difficulty urinating.  Musculoskeletal: Negative.  Negative for arthralgias, back pain, myalgias and neck pain.  Skin: Negative.   Neurological: Negative.  Negative for dizziness and weakness.  Hematological:  Negative for adenopathy. Does not bruise/bleed easily.  Psychiatric/Behavioral:  Positive for dysphoric mood. Negative for sleep disturbance and suicidal ideas. The patient is not nervous/anxious.     Objective:  BP 126/68 (BP Location: Left Arm, Patient Position: Sitting, Cuff Size: Large)   Pulse 78   Temp 98.5 F (36.9 C) (Oral)   Resp 16   Ht 5\' 4"  (1.626 m)   Wt 143 lb (64.9 kg)   SpO2 98%   BMI 24.55 kg/m   BP Readings from Last 3 Encounters:  11/18/22 126/68  10/27/22 125/75  09/16/22 138/68    Wt Readings from Last 3 Encounters:  11/18/22 143 lb (64.9 kg)  10/27/22 153 lb (69.4 kg)  09/16/22 149 lb 6.4 oz (67.8 kg)    Physical Exam Vitals reviewed.  Constitutional:      Appearance: Normal appearance.  HENT:     Mouth/Throat:     Mouth: Mucous membranes are moist.  Eyes:     General: No scleral icterus.    Conjunctiva/sclera: Conjunctivae normal.  Cardiovascular:     Rate and Rhythm: Normal rate and regular rhythm.     Heart sounds: No murmur heard.    No friction rub. No gallop.  Pulmonary:     Effort: Pulmonary effort is normal.     Breath sounds: No stridor. No wheezing, rhonchi or rales.  Abdominal:     General: Abdomen is flat.     Palpations: There is no mass.     Tenderness: There is no abdominal tenderness. There is no guarding.     Hernia: No hernia is present.  Musculoskeletal:        General: Normal range of motion.     Cervical back: Neck supple.     Right lower leg: No edema.     Left lower leg: No edema.  Lymphadenopathy:     Cervical: No cervical adenopathy.  Skin:    General: Skin is warm and dry.  Neurological:     General: No focal deficit present.     Mental Status: She  is alert. Mental status is at baseline.  Psychiatric:        Mood and Affect: Mood normal.        Behavior: Behavior normal.     Lab Results  Component Value Date   WBC 11.1 (H) 09/07/2022   HGB 12.7 09/07/2022   HCT 38.2 09/07/2022   PLT 276 09/07/2022   GLUCOSE 131 (H) 11/18/2022   CHOL 182 06/21/2022   TRIG 90.0 06/21/2022   HDL 67.10 06/21/2022   LDLCALC 97 06/21/2022   ALT 28 09/07/2022   AST 27 09/07/2022   NA  135 11/18/2022   K 5.1 11/18/2022   CL 98 11/18/2022   CREATININE 0.58 11/18/2022   BUN 13 11/18/2022   CO2 28 11/18/2022   TSH 0.78 11/18/2022   INR 1.0 09/07/2022   HGBA1C 6.3 11/18/2022   MICROALBUR <0.7 06/21/2022    MR BRAIN WO CONTRAST  Result Date: 09/07/2022 CLINICAL DATA:  Neuro deficit, acute, stroke suspected EXAM: MRI HEAD WITHOUT CONTRAST TECHNIQUE: Multiplanar, multiecho pulse sequences of the brain and surrounding structures were obtained without intravenous contrast. COMPARISON:  Same day CT head. FINDINGS: Brain: No acute infarction, hemorrhage, hydrocephalus, extra-axial collection or mass lesion. Vascular: Major arterial flow voids are maintained at the skull base. Skull and upper cervical spine: Normal marrow signal. Sinuses/Orbits: Clear sinuses.  No acute orbital findings. Other: No mastoid effusions. IMPRESSION: Normal brain MRI.  No acute abnormality. Electronically Signed   By: Feliberto Harts M.D.   On: 09/07/2022 17:12   CT HEAD WO CONTRAST  Result Date: 09/07/2022 CLINICAL DATA:  Right-sided facial numbness and tingling with loss of vision. EXAM: CT HEAD WITHOUT CONTRAST TECHNIQUE: Contiguous axial images were obtained from the base of the skull through the vertex without intravenous contrast. RADIATION DOSE REDUCTION: This exam was performed according to the departmental dose-optimization program which includes automated exposure control, adjustment of the mA and/or kV according to patient size and/or use of iterative reconstruction  technique. COMPARISON:  03/09/2020 head CT FINDINGS: Brain: No evidence of acute infarction, hemorrhage, hydrocephalus, extra-axial collection or mass lesion/mass effect. Vascular: No hyperdense vessel or unexpected calcification. Skull: Normal. Negative for fracture or focal lesion. Sinuses/Orbits: No acute finding. IMPRESSION: Normal head CT. Electronically Signed   By: Tiburcio Pea M.D.   On: 09/07/2022 13:14    Assessment & Plan:   Type II diabetes mellitus with complication (HCC)- Her blood sugar is well-controlled. -     Basic metabolic panel; Future -     Hemoglobin A1c; Future -     HM Diabetes Foot Exam  Primary hypertension- Her blood pressure is well-controlled. -     TSH; Future  Acquired hypothyroidism- She is euthyroid. -     TSH; Future -     Levothyroxine Sodium; Take 1 tablet (25 mcg total) by mouth every morning.  Dispense: 90 tablet; Refill: 1  Flu vaccine need -     Flu vaccine trivalent PF, 6mos and older(Flulaval,Afluria,Fluarix,Fluzone)     Follow-up: Return in about 6 months (around 05/18/2023).  Sanda Linger, MD

## 2022-11-18 NOTE — Patient Instructions (Signed)

## 2022-12-03 DIAGNOSIS — F411 Generalized anxiety disorder: Secondary | ICD-10-CM | POA: Diagnosis not present

## 2022-12-03 DIAGNOSIS — F319 Bipolar disorder, unspecified: Secondary | ICD-10-CM | POA: Diagnosis not present

## 2022-12-06 DIAGNOSIS — F319 Bipolar disorder, unspecified: Secondary | ICD-10-CM | POA: Diagnosis not present

## 2022-12-06 DIAGNOSIS — F411 Generalized anxiety disorder: Secondary | ICD-10-CM | POA: Diagnosis not present

## 2022-12-07 ENCOUNTER — Encounter: Payer: PPO | Attending: Internal Medicine | Admitting: Dietician

## 2022-12-07 ENCOUNTER — Other Ambulatory Visit: Payer: Self-pay | Admitting: Internal Medicine

## 2022-12-07 VITALS — Ht 62.0 in

## 2022-12-07 DIAGNOSIS — E118 Type 2 diabetes mellitus with unspecified complications: Secondary | ICD-10-CM | POA: Diagnosis not present

## 2022-12-07 DIAGNOSIS — R931 Abnormal findings on diagnostic imaging of heart and coronary circulation: Secondary | ICD-10-CM

## 2022-12-07 DIAGNOSIS — R0609 Other forms of dyspnea: Secondary | ICD-10-CM

## 2022-12-07 MED ORDER — NITROGLYCERIN 0.4 MG SL SUBL
0.4000 mg | SUBLINGUAL_TABLET | SUBLINGUAL | 0 refills | Status: AC | PRN
Start: 2022-12-07 — End: ?

## 2022-12-07 NOTE — Patient Instructions (Addendum)
Consider trying 1 new recipe per week to help with variety Enjoy your food Jessica Guzman are wonderful:  cabbage, broccoli, collards, kale, beets, cauliflower, spinach, etc.  Recipe Resources: Cooking for Peanuts Plant You From my Bowl  Easy food line: Plant Strong food line [employer] Silver Palate Low sodium pasta sauce   Cooking is a form of Love and is self care. Do something that brings you Joy everyday!  Enjoy all aspects of your vacation!

## 2022-12-07 NOTE — Progress Notes (Signed)
Diabetes Self-Management Education  Visit Type: First/Initial  Appt. Start Time: 1415 Appt. End Time: 1530  12/07/2022  [name], identified by name and date of birth, is a 62 y.o. female with a diagnosis of Diabetes: Type 2.   ASSESSMENT  Height 5\' 2"  (1.575 m). Body mass index is 26.16 kg/m.  Patient is here today alone.  She was last seen by another RD in this office in 2016 and 2022. Reviewed previous visits.  Patient stated in 2016 that she had a lot of emotional distress with the concern of food.  She states that she feels a "slowing" and does not know the cause although she stated that she took a higher dose of her psychiatric medications last night. She participated in Nutrition Therapy in the past but her insurance stopped covering this.  Patient states that she has recently been diagnosed with coronary artery disease and would like information about how to take care of herself with this.    Just prior to this diagnosis, she lost a friend to CHF and a brother in law to a sudden heart attack.  She lives alone. She enjoys cooking her own foods but has been eating out a lot since her brother-in-law died 3-4 times per week. Usually avoids beef and cheese except low sodium goat cheese. Follows a low fat, low sugar diet.  More plant based, low sodium.  Eats seafood. She is from a Hispanic/[place] family and ate quite healthfully throughout her life. She likes to walk, swim, and bike.  She enjoys this 5-7 days per week for 30-40 minutes.  Referral dx:  Insulin requiring Type 2 Diabetes  History includes:  Type 2 Diabetes, PCOS, bipolar, CAD, severe anxiety, eating disorder in college (overly restrictive, compulsive, over exercise - she lost to 100 lbs), nerve pain Medications:  Metformin, synthroid, vitamin D, vitamin B complex Labs noted to include:  6.3% increased from 6% 06/21/2022 Lab Results  Component Value Date   CHOL 182 06/21/2022   CHOL 195 02/02/2021   Lab  Results  Component Value Date   HDL 67.10 06/21/2022   HDL 51 02/02/2021   Lab Results  Component Value Date   LDLCALC 97 06/21/2022   LDLCALC 114 02/02/2021   Lab Results  Component Value Date   TRIG 90.0 06/21/2022   TRIG 152 02/02/2021   Lab Results  Component Value Date   CHOLHDL 3 06/21/2022   No results found for: "LDLDIRECT"   CGM Results from download:   % Time CGM active:   83 %   (Goal >70%)  Average glucose:   123 mg/dL for 7 days  Glucose management indicator:    %  Time in range (70-180 mg/dL):   99 %   (Goal >14%)  Time High (181-250 mg/dL):   1 %   (Goal < 78%)  Time Very High (>250 mg/dL):    0 %   (Goal < 5%)  Time Low (54-69 mg/dL):   0 %   (Goal <2%)  Time Very Low (<54 mg/dL):   0 %   (Goal <9%)  %CV (glucose variability)     %  (Goal <36%)    CGM:  FreeStyle Libre 2 (2 years ago and got off insulin due to being aware of her blood glucose).   She states that she has lost 40 lbs after using the CGM as well as grief earlier this year.     Diabetes Self-Management Education - 12/07/22 1553  Visit Information   Visit Type First/Initial      Initial Visit   Diabetes Type Type 2    Are you currently following a meal plan? Yes    Are you taking your medications as prescribed? Yes      Health Coping   How would you rate your overall health? Fair      Psychosocial Assessment   Patient Belief/Attitude about Diabetes Motivated to manage diabetes    What is the hardest part about your diabetes right now, causing you the most concern, or is the most worrisome to you about your diabetes?   Making healty food and beverage choices    Self-care barriers Other (comment)    Self-management support Doctor's office;Family    Other persons present Patient    Patient Concerns Nutrition/Meal planning;Problem Solving;Other (comment)   CAD dx   Special Needs None    Preferred Learning Style No preference indicated    Learning Readiness Change in progress     How often do you need to have someone help you when you read instructions, pamphlets, or other written materials from your doctor or pharmacy? 1 - Never    What is the last grade level you completed in school? Bachelors      Pre-Education Assessment   Patient understands the diabetes disease and treatment process. Needs Instruction    Patient understands incorporating nutritional management into lifestyle. Needs Instruction    Patient undertands incorporating physical activity into lifestyle. Needs Instruction    Patient understands using medications safely. Needs Instruction    Patient understands monitoring blood glucose, interpreting and using results Needs Instruction    Patient understands prevention, detection, and treatment of acute complications. Needs Instruction    Patient understands prevention, detection, and treatment of chronic complications. Needs Instruction    Patient understands how to develop strategies to address psychosocial issues. Needs Instruction    Patient understands how to develop strategies to promote health/change behavior. Needs Instruction      Complications   Last HgB A1C per patient/outside source 6.3 %    How often do you check your blood sugar? > 4 times/day    Fasting Blood glucose range (mg/dL) 604-540    Postprandial Blood glucose range (mg/dL) 981-191    Number of hypoglycemic episodes per month 0    Have you had a dilated eye exam in the past 12 months? No   appt scheduled   Have you had a dental exam in the past 12 months? Yes    Are you checking your feet? Yes    How many days per week are you checking your feet? 7      Dietary Intake   Breakfast Greek yogurt, toast    Lunch L:  Seafood soup, 1/2 taco    Dinner Tilapia (baked), sweet, potato, pinto beans, salad    Beverage(s) Water, coffee (black), almond milk,      Activity / Exercise   Activity / Exercise Type Light (walking / raking leaves)    How many days per week do you exercise? 30     How many minutes per day do you exercise? 6    Total minutes per week of exercise 180      Patient Education   Disease Pathophysiology Explored patient's options for treatment of their diabetes    Healthy Eating Effects of alcohol on blood glucose and safety factors with consumption of alcohol.;Role of diet in the treatment of diabetes and the relationship between the three  main macronutrients and blood glucose level;Reviewed blood glucose goals for pre and post meals and how to evaluate the patients' food intake on their blood glucose level.;Meal options for control of blood glucose level and chronic complications.    Being Active Role of exercise on diabetes management, blood pressure control and cardiac health.    Medications Reviewed patients medication for diabetes, action, purpose, timing of dose and side effects.    Monitoring Taught/evaluated CGM (comment)    Chronic complications Lipid levels, blood glucose control and heart disease;Identified and discussed with patient  current chronic complications    Diabetes Stress and Support Identified and addressed patients feelings and concerns about diabetes;Worked with patient to identify barriers to care and solutions      Individualized Goals (developed by patient)   Nutrition General guidelines for healthy choices and portions discussed    Physical Activity Exercise 5-7 days per week    Medications take my medication as prescribed    Monitoring  Consistenly use CGM    Problem Solving Social situations;Addressing barriers to behavior change;Eating Pattern    Reducing Risk Not Applicable    Health Coping Ask for help with psychological, social, or emotional issues;Discuss barriers to diabetes care with support person/system (comment specifics as needed)      Post-Education Assessment   Patient understands the diabetes disease and treatment process. Demonstrates understanding / competency    Patient understands incorporating nutritional  management into lifestyle. Needs Review    Patient undertands incorporating physical activity into lifestyle. Demonstrates understanding / competency    Patient understands using medications safely. Demonstrates understanding / competency    Patient understands monitoring blood glucose, interpreting and using results Demonstrates understanding / competency    Patient understands prevention, detection, and treatment of acute complications. Needs Review    Patient understands prevention, detection, and treatment of chronic complications. Needs Review    Patient understands how to develop strategies to address psychosocial issues. Needs Review    Patient understands how to develop strategies to promote health/change behavior. Comprehends key points      Outcomes   Expected Outcomes Demonstrated interest in learning. Expect positive outcomes    Future DMSE 2 months    Program Status Not Completed             Individualized Plan for Diabetes Self-Management Training:   Learning Objective:  Patient will have a greater understanding of diabetes self-management. Patient education plan is to attend individual and/or group sessions per assessed needs and concerns.   Plan:   Patient Instructions  Consider trying 1 new recipe per week to help with variety Enjoy your food Jessica Guzman are wonderful:  cabbage, broccoli, collards, kale, beets, cauliflower, spinach, etc.  Recipe Resources: Cooking for Peanuts Plant You From my Bowl  Easy food line: Plant Strong food line [employer] Silver Palate Low sodium pasta sauce   Cooking is a form of Love and is self care. Do something that brings you Joy everyday!  Enjoy all aspects of your vacation!  Expected Outcomes:  Demonstrated interest in learning. Expect positive outcomes  Education material provided:   If problems or questions, patient to contact team via:  Phone  Future DSME appointment: 2 months

## 2022-12-25 ENCOUNTER — Telehealth: Payer: PPO | Admitting: Physician Assistant

## 2022-12-25 DIAGNOSIS — U071 COVID-19: Secondary | ICD-10-CM | POA: Diagnosis not present

## 2022-12-25 MED ORDER — MOLNUPIRAVIR EUA 200MG CAPSULE
4.0000 | ORAL_CAPSULE | Freq: Two times a day (BID) | ORAL | 0 refills | Status: AC
Start: 2022-12-25 — End: 2022-12-30

## 2022-12-25 MED ORDER — BENZONATATE 100 MG PO CAPS
100.0000 mg | ORAL_CAPSULE | Freq: Three times a day (TID) | ORAL | 0 refills | Status: DC | PRN
Start: 2022-12-25 — End: 2023-03-22

## 2022-12-25 NOTE — Patient Instructions (Signed)
Jessica Guzman, thank you for joining [name], PA-C for today's virtual visit.  While this provider is not your primary care provider (PCP), if your PCP is located in our provider database this encounter information will be shared with them immediately following your visit.   A [place] MyChart account gives you access to today's visit and all your visits, tests, and labs performed at [facility] " click here if you don't have a [place] [facility] MyChart account or go to [email or web address]  Consent: (Patient) Jessica Guzman provided verbal consent for this virtual visit at the beginning of the encounter.  Current Medications:  Current Outpatient Medications:    aspirin 81 MG chewable tablet, Chew by mouth daily., Disp: , Rfl:    atorvastatin (LIPITOR) 80 MG tablet, Take 1 tablet (80 mg total) by mouth at bedtime., Disp: 90 tablet, Rfl: 3   b complex vitamins capsule, Take 1 capsule by mouth daily., Disp: , Rfl:    buPROPion (WELLBUTRIN XL) 150 MG 24 hr tablet, Take 150 mg by mouth daily., Disp: , Rfl:    busPIRone (BUSPAR) 10 MG tablet, Take 20 mg by mouth 2 (two) times daily., Disp: , Rfl:    carbamazepine (TEGRETOL) 200 MG tablet, Take 300 tablets by mouth 3 (three) times daily. at Bedtime, Disp: , Rfl:    cholecalciferol (VITAMIN D3) 10 MCG/ML LIQD oral liquid, Take 400 Units by mouth daily., Disp: , Rfl:    Continuous Blood Gluc Receiver (FREESTYLE LIBRE 3 READER) DEVI, 1 Act by Does not apply route daily., Disp: 1 each, Rfl: 3   Continuous Glucose Sensor (FREESTYLE LIBRE 2 SENSOR) MISC, APPLY EVERY 14 DAYS., Disp: 2 each, Rfl: 5   gabapentin (NEURONTIN) 100 MG capsule, Take 100-200 mg by mouth 2 (two) times daily as needed., Disp: , Rfl:    Glucagon (GVOKE HYPOPEN 2-PACK) 1 MG/0.2ML SOAJ, Inject 1 Act into the skin daily as needed., Disp: 2 mL, Rfl: 5   levothyroxine (SYNTHROID) 25 MCG tablet, Take 1 tablet (25 mcg total) by mouth every morning.,  Disp: 90 tablet, Rfl: 1   metFORMIN (GLUCOPHAGE) 500 MG tablet, Take 1 tablet (500 mg total) by mouth 2 (two) times daily with a meal., Disp: 60 tablet, Rfl: 1   nitroGLYCERIN (NITROSTAT) 0.4 MG SL tablet, Place 1 tablet (0.4 mg total) under the tongue every 5 (five) minutes as needed for chest pain., Disp: 50 tablet, Rfl: 0   olmesartan (BENICAR) 40 MG tablet, Take 1 tablet (40 mg total) by mouth daily., Disp: 90 tablet, Rfl: 1   pregabalin (LYRICA) 25 MG capsule, Take 1 capsule (25 mg total) by mouth 3 (three) times daily. (Patient not taking: Reported on 12/07/2022), Disp: 90 capsule, Rfl: 0   QUEtiapine (SEROQUEL) 300 MG tablet, Take 300 mg by mouth daily., Disp: , Rfl:    valACYclovir (VALTREX) 500 MG tablet, Take 1 tablet (500 mg total) by mouth daily. (Patient not taking: Reported on 12/07/2022), Disp: 30 tablet, Rfl: 0   Medications ordered in this encounter:  No orders of the defined types were placed in this encounter.    *If you need refills on other medications prior to your next appointment, please contact your pharmacy*  Follow-Up: Call back or seek an in-person evaluation if the symptoms worsen or if the condition fails to improve as anticipated.  [facility] [phone]  Care Instructions: Please keep well-hydrated and get plenty of rest. Start a saline nasal rinse to flush out your  nasal passages. You can use plain Mucinex to help thin congestion. If you have a humidifier, running in the bedroom at night. I want you to start OTC vitamin D3 1000 units daily, vitamin C 1000 mg daily, and a zinc supplement. Please take prescribed medications as directed.   Isolation Instructions: You are to isolate at home until you have been fever free for at least 24 hours without a fever-reducing medication, and symptoms have been steadily improving for 24 hours. At that time,  you can end isolation but need to mask for an additional 5 days.   If you must be around other  household members who do not have symptoms, you need to make sure that both you and the family members are masking consistently with a high-quality mask.  If you note any worsening of symptoms despite treatment, please seek an in-person evaluation ASAP. If you note any significant shortness of breath or any chest pain, please seek ER evaluation. Please do not delay care!   COVID-19: What to Do if You Are Sick If you test positive and are an older adult or someone who is at high risk of getting very sick from COVID-19, treatment may be available. Contact a healthcare provider right away after a positive test to determine if you are eligible, even if your symptoms are mild right now. You can also visit a Test to Treat location and, if eligible, receive a prescription from a provider. Don't delay: Treatment must be started within the first few days to be effective. If you have a fever, cough, or other symptoms, you might have COVID-19. Most people have mild illness and are able to recover at home. If you are sick: Keep track of your symptoms. If you have an emergency warning sign (including trouble breathing), call 911. Steps to help prevent the spread of COVID-19 if you are sick If you are sick with COVID-19 or think you might have COVID-19, follow the steps below to care for yourself and to help protect other people in your home and community. Stay home except to get medical care Stay home. Most people with COVID-19 have mild illness and can recover at home without medical care. Do not leave your home, except to get medical care. Do not visit public areas and do not go to places where you are unable to wear a mask. Take care of yourself. Get rest and stay hydrated. Take over-the-counter medicines, such as acetaminophen, to help you feel better. Stay in touch with your doctor. Call before you get medical care. Be sure to get care if you have trouble breathing, or have any other emergency warning signs,  or if you think it is an emergency. Avoid public transportation, ride-sharing, or taxis if possible. Get tested If you have symptoms of COVID-19, get tested. While waiting for test results, stay away from others, including staying apart from those living in your household. Get tested as soon as possible after your symptoms start. Treatments may be available for people with COVID-19 who are at risk for becoming very sick. Don't delay: Treatment must be started early to be effective--some treatments must begin within 5 days of your first symptoms. Contact your healthcare provider right away if your test result is positive to determine if you are eligible. Self-tests are one of several options for testing for the virus that causes COVID-19 and may be more convenient than laboratory-based tests and point-of-care tests. Ask your healthcare provider or your local health department if you need  help interpreting your test results. You can visit your state, tribal, local, and territorial health department's website to look for the latest local information on testing sites. Separate yourself from other people As much as possible, stay in a specific room and away from other people and pets in your home. If possible, you should use a separate bathroom. If you need to be around other people or animals in or outside of the home, wear a well-fitting mask. Tell your close contacts that they may have been exposed to COVID-19. An infected person can spread COVID-19 starting 48 hours (or 2 days) before the person has any symptoms or tests positive. By letting your close contacts know they may have been exposed to COVID-19, you are helping to protect everyone. See COVID-19 and Animals if you have questions about pets. If you are diagnosed with COVID-19, someone from the health department may call you. Answer the call to slow the spread. Monitor your symptoms Symptoms of COVID-19 include fever, cough, or other  symptoms. Follow care instructions from your healthcare provider and local health department. Your local health authorities may give instructions on checking your symptoms and reporting information. When to seek emergency medical attention Look for emergency warning signs* for COVID-19. If someone is showing any of these signs, seek emergency medical care immediately: Trouble breathing Persistent pain or pressure in the chest New confusion Inability to wake or stay awake Pale, gray, or blue-colored skin, lips, or nail beds, depending on skin tone *This list is not all possible symptoms. Please call your medical provider for any other symptoms that are severe or concerning to you. Call 911 or call ahead to your local emergency facility: Notify the operator that you are seeking care for someone who has or may have COVID-19. Call ahead before visiting your doctor Call ahead. Many medical visits for routine care are being postponed or done by phone or telemedicine. If you have a medical appointment that cannot be postponed, call your doctor's office, and tell them you have or may have COVID-19. This will help the office protect themselves and other patients. If you are sick, wear a well-fitting mask You should wear a mask if you must be around other people or animals, including pets (even at home). Wear a mask with the best fit, protection, and comfort for you. You don't need to wear the mask if you are alone. If you can't put on a mask (because of trouble breathing, for example), cover your coughs and sneezes in some other way. Try to stay at least 6 feet away from other people. This will help protect the people around you. Masks should not be placed on young children under age 48 years, anyone who has trouble breathing, or anyone who is not able to remove the mask without help. Cover your coughs and sneezes Cover your mouth and nose with a tissue when you cough or sneeze. Throw away used tissues in  a lined trash can. Immediately wash your hands with soap and water for at least 20 seconds. If soap and water are not available, clean your hands with an alcohol-based hand sanitizer that contains at least 60% alcohol. Clean your hands often Wash your hands often with soap and water for at least 20 seconds. This is especially important after blowing your nose, coughing, or sneezing; going to the bathroom; and before eating or preparing food. Use hand sanitizer if soap and water are not available. Use an alcohol-based hand sanitizer with at least 60%  alcohol, covering all surfaces of your hands and rubbing them together until they feel dry. Soap and water are the best option, especially if hands are visibly dirty. Avoid touching your eyes, nose, and mouth with unwashed hands. Handwashing Tips Avoid sharing personal household items Do not share dishes, drinking glasses, cups, eating utensils, towels, or bedding with other people in your home. Wash these items thoroughly after using them with soap and water or put in the dishwasher. Clean surfaces in your home regularly Clean and disinfect high-touch surfaces (for example, doorknobs, tables, handles, light switches, and countertops) in your "sick room" and bathroom. In shared spaces, you should clean and disinfect surfaces and items after each use by the person who is ill. If you are sick and cannot clean, a caregiver or other person should only clean and disinfect the area around you (such as your bedroom and bathroom) on an as needed basis. Your caregiver/other person should wait as long as possible (at least several hours) and wear a mask before entering, cleaning, and disinfecting shared spaces that you use. Clean and disinfect areas that may have blood, stool, or body fluids on them. Use household cleaners and disinfectants. Clean visible dirty surfaces with household cleaners containing soap or detergent. Then, use a household disinfectant. Use a  product from [employer] List N: Disinfectants for Coronavirus (COVID-19). Be sure to follow the instructions on the label to ensure safe and effective use of the product. Many products recommend keeping the surface wet with a disinfectant for a certain period of time (look at "contact time" on the product label). You may also need to wear personal protective equipment, such as gloves, depending on the directions on the product label. Immediately after disinfecting, wash your hands with soap and water for 20 seconds. For completed guidance on cleaning and disinfecting your home, visit Complete Disinfection Guidance. Take steps to improve ventilation at home Improve ventilation (air flow) at home to help prevent from spreading COVID-19 to other people in your household. Clear out COVID-19 virus particles in the air by opening windows, using air filters, and turning on fans in your home. Use this interactive tool to learn how to improve air flow in your home. When you can be around others after being sick with COVID-19 Deciding when you can be around others is different for different situations. Find out when you can safely end home isolation. For any additional questions about your care, contact your healthcare provider or state or local health department. 05/27/2020 Content source: [facility] ([facility]), Division of Viral Diseases This information is not intended to replace advice given to you by your health care provider. Make sure you discuss any questions you have with your health care provider. Document Revised: 07/10/2020 Document Reviewed: 07/10/2020 Elsevier Patient Education  2022 [employer].   If you have been instructed to have an in-person evaluation today at a local Urgent Care facility, please use the link below. It will take you to a list of all of our available [facility], including address, phone number and hours of operation.  Please do not delay care.  [facility]  If you or a family member do not have a primary care provider, use the link below to schedule a visit and establish care. When you choose a [facility] primary care physician or advanced practice provider, you gain a long-term partner in health. Find a Primary Care Provider  Learn more about [place]'s  in-office and virtual care options: [place] - Get Care Now

## 2022-12-25 NOTE — Progress Notes (Signed)
Virtual Visit Consent   [name], you are scheduled for a virtual visit with a [facility] provider today. Just as with appointments in the office, your consent must be obtained to participate. Your consent will be active for this visit and any virtual visit you may have with one of our providers in the next 365 days. If you have a MyChart account, a copy of this consent can be sent to you electronically.  As this is a virtual visit, video technology does not allow for your provider to perform a traditional examination. This may limit your provider's ability to fully assess your condition. If your provider identifies any concerns that need to be evaluated in person or the need to arrange testing (such as labs, EKG, etc.), we will make arrangements to do so. Although advances in technology are sophisticated, we cannot ensure that it will always work on either your end or our end. If the connection with a video visit is poor, the visit may have to be switched to a telephone visit. With either a video or telephone visit, we are not always able to ensure that we have a secure connection.  By engaging in this virtual visit, you consent to the provision of healthcare and authorize for your insurance to be billed (if applicable) for the services provided during this visit. Depending on your insurance coverage, you may receive a charge related to this service.  I need to obtain your verbal consent now. Are you willing to proceed with your visit today? Jessica Guzman has provided verbal consent on 12/25/2022 for a virtual visit (video or telephone). Jessica Guzman, [place]  Date: 12/25/2022 6:43 PM  Virtual Visit via Video Note   I, Jessica Guzman, connected with  Jessica Guzman  ([id number], March 10, 1960) on 12/25/22 at  6:30 PM EDT by a video-enabled telemedicine application and verified that I am speaking with the correct person using two identifiers.  Location: Patient:  Virtual Visit Location Patient: Home Provider: Virtual Visit Location Provider: Home Office   I discussed the limitations of evaluation and management by telemedicine and the availability of in person appointments. The patient expressed understanding and agreed to proceed.    History of Present Illness: Jessica Guzman is a 62 y.o. who identifies as a female who was assigned female at birth, and is being seen today for COVID-19. Endorses symptoms starting yesterday morning with sore throat that eased up. Today with sore throat, fever so took a COVID test due to recent travel which was positive. Now with nasal congestion, aches. Denies chest pain or SOB. Some nausea this morning that resolved.   OTC -- Alka Seltzer Cold  HPI: HPI  Problems:  Patient Active Problem List   Diagnosis Date Noted   Fibromyalgia 08/17/2022   Agatston CAC score 200-399 07/31/2022   Dyslipidemia, goal LDL below 100 06/21/2022   High risk heterosexual behavior 12/17/2021   Encounter for general adult medical examination with abnormal findings 12/17/2021   Visit for screening mammogram 12/17/2021   Primary osteoarthritis of right hip 08/10/2021   Murmur, cardiac 08/06/2021   Herpes simplex vulvovaginitis 06/16/2021   Type II diabetes mellitus with complication (HCC) 02/19/2021   Primary hypertension 02/19/2021   Insulin-requiring or dependent type II diabetes mellitus (HCC) 02/19/2021   Insomnia due to other mental disorder 01/28/2021   Snoring 01/28/2021   Other parasomnia 01/28/2021   Bipolar disorder, curr episode manic w/o psychotic features, moderate (HCC) 01/21/2017    Allergies:  Allergies  Allergen Reactions   Codeine Nausea And Vomiting   Medications:  Current Outpatient Medications:    benzonatate (TESSALON) 100 MG capsule, Take 1 capsule (100 mg total) by mouth 3 (three) times daily as needed for cough., Disp: 30 capsule, Rfl: 0   carbamazepine (CARBATROL) 300 MG 12 hr capsule, Take 300 mg  by mouth at bedtime., Disp: , Rfl:    molnupiravir EUA (LAGEVRIO) 200 mg CAPS capsule, Take 4 capsules (800 mg total) by mouth 2 (two) times daily for 5 days., Disp: 40 capsule, Rfl: 0   aspirin 81 MG chewable tablet, Chew by mouth daily., Disp: , Rfl:    atorvastatin (LIPITOR) 80 MG tablet, Take 1 tablet (80 mg total) by mouth at bedtime., Disp: 90 tablet, Rfl: 3   b complex vitamins capsule, Take 1 capsule by mouth daily., Disp: , Rfl:    buPROPion (WELLBUTRIN XL) 150 MG 24 hr tablet, Take 150 mg by mouth daily., Disp: , Rfl:    busPIRone (BUSPAR) 10 MG tablet, Take 20 mg by mouth 2 (two) times daily., Disp: , Rfl:    cholecalciferol (VITAMIN D3) 10 MCG/ML LIQD oral liquid, Take 400 Units by mouth daily., Disp: , Rfl:    Continuous Blood Gluc Receiver (FREESTYLE LIBRE 3 READER) DEVI, 1 Act by Does not apply route daily., Disp: 1 each, Rfl: 3   Continuous Glucose Sensor (FREESTYLE LIBRE 2 SENSOR) MISC, APPLY EVERY 14 DAYS., Disp: 2 each, Rfl: 5   gabapentin (NEURONTIN) 100 MG capsule, Take 100-200 mg by mouth 2 (two) times daily as needed., Disp: , Rfl:    Glucagon (GVOKE HYPOPEN 2-PACK) 1 MG/0.2ML SOAJ, Inject 1 Act into the skin daily as needed., Disp: 2 mL, Rfl: 5   levothyroxine (SYNTHROID) 25 MCG tablet, Take 1 tablet (25 mcg total) by mouth every morning., Disp: 90 tablet, Rfl: 1   metFORMIN (GLUCOPHAGE) 500 MG tablet, Take 1 tablet (500 mg total) by mouth 2 (two) times daily with a meal., Disp: 60 tablet, Rfl: 1   nitroGLYCERIN (NITROSTAT) 0.4 MG SL tablet, Place 1 tablet (0.4 mg total) under the tongue every 5 (five) minutes as needed for chest pain., Disp: 50 tablet, Rfl: 0   olmesartan (BENICAR) 40 MG tablet, Take 1 tablet (40 mg total) by mouth daily., Disp: 90 tablet, Rfl: 1   pregabalin (LYRICA) 25 MG capsule, Take 1 capsule (25 mg total) by mouth 3 (three) times daily. (Patient not taking: Reported on 12/07/2022), Disp: 90 capsule, Rfl: 0   QUEtiapine (SEROQUEL) 300 MG tablet, Take 300  mg by mouth daily., Disp: , Rfl:    valACYclovir (VALTREX) 500 MG tablet, Take 1 tablet (500 mg total) by mouth daily. (Patient not taking: Reported on 12/07/2022), Disp: 30 tablet, Rfl: 0  Observations/Objective: Patient is well-developed, well-nourished in no acute distress.  Resting comfortably at home.  Head is normocephalic, atraumatic.  No labored breathing. Speech is clear and coherent with logical content.  Patient is alert and oriented at baseline.   Assessment and Plan: 1. COVID-19 - benzonatate (TESSALON) 100 MG capsule; Take 1 capsule (100 mg total) by mouth 3 (three) times daily as needed for cough.  Dispense: 30 capsule; Refill: 0 - molnupiravir EUA (LAGEVRIO) 200 mg CAPS capsule; Take 4 capsules (800 mg total) by mouth 2 (two) times daily for 5 days.  Dispense: 40 capsule; Refill: 0  Patient with multiple risk factors for complicated course of illness. Discussed risks/benefits of antiviral medications including most common potential ADRs. Patient voiced  understanding and would like to proceed with antiviral medication. They are candidate for Molnupiravir (Cannot take Paxlovid due to multiple drug interactions). Rx sent to pharmacy. PA completed since Medicare does not consider Molnupiravir a preferred drug. Supportive measures, OTC medications and vitamin regimen reviewed. Tessalon per orders. Quarantine reviewed in detail. Strict ER precautions discussed with patient.    Follow Up Instructions: I discussed the assessment and treatment plan with the patient. The patient was provided an opportunity to ask questions and all were answered. The patient agreed with the plan and demonstrated an understanding of the instructions.  A copy of instructions were sent to the patient via MyChart unless otherwise noted below.   The patient was advised to call back or seek an in-person evaluation if the symptoms worsen or if the condition fails to improve as anticipated.    Jessica Climes, PA-C

## 2022-12-29 ENCOUNTER — Ambulatory Visit: Payer: PPO | Admitting: Internal Medicine

## 2022-12-31 DIAGNOSIS — F319 Bipolar disorder, unspecified: Secondary | ICD-10-CM | POA: Diagnosis not present

## 2022-12-31 DIAGNOSIS — F411 Generalized anxiety disorder: Secondary | ICD-10-CM | POA: Diagnosis not present

## 2023-01-03 DIAGNOSIS — F319 Bipolar disorder, unspecified: Secondary | ICD-10-CM | POA: Diagnosis not present

## 2023-01-03 DIAGNOSIS — F411 Generalized anxiety disorder: Secondary | ICD-10-CM | POA: Diagnosis not present

## 2023-01-04 DIAGNOSIS — H5203 Hypermetropia, bilateral: Secondary | ICD-10-CM | POA: Diagnosis not present

## 2023-01-04 DIAGNOSIS — H2513 Age-related nuclear cataract, bilateral: Secondary | ICD-10-CM | POA: Diagnosis not present

## 2023-01-04 DIAGNOSIS — E119 Type 2 diabetes mellitus without complications: Secondary | ICD-10-CM | POA: Diagnosis not present

## 2023-01-04 DIAGNOSIS — H524 Presbyopia: Secondary | ICD-10-CM | POA: Diagnosis not present

## 2023-01-04 LAB — HM DIABETES EYE EXAM

## 2023-01-05 ENCOUNTER — Encounter: Payer: Self-pay | Admitting: Internal Medicine

## 2023-01-10 ENCOUNTER — Ambulatory Visit: Payer: PPO | Admitting: Neurology

## 2023-01-11 ENCOUNTER — Other Ambulatory Visit: Payer: Self-pay | Admitting: Internal Medicine

## 2023-01-11 DIAGNOSIS — A6004 Herpesviral vulvovaginitis: Secondary | ICD-10-CM

## 2023-01-11 MED ORDER — VALACYCLOVIR HCL 500 MG PO TABS
500.0000 mg | ORAL_TABLET | Freq: Every day | ORAL | 0 refills | Status: DC
Start: 2023-01-11 — End: 2023-04-06

## 2023-01-12 DIAGNOSIS — E785 Hyperlipidemia, unspecified: Secondary | ICD-10-CM | POA: Diagnosis not present

## 2023-01-13 ENCOUNTER — Ambulatory Visit: Payer: PPO | Admitting: Dietician

## 2023-01-13 LAB — LIPID PANEL
Chol/HDL Ratio: 2.5 {ratio} (ref 0.0–4.4)
Cholesterol, Total: 134 mg/dL (ref 100–199)
HDL: 53 mg/dL (ref >39)
LDL Chol Calc (NIH): 69 mg/dL (ref 0–99)
Triglycerides: 57 mg/dL (ref 0–149)
VLDL Cholesterol Cal: 12 mg/dL (ref 5–40)

## 2023-01-19 ENCOUNTER — Other Ambulatory Visit: Payer: Self-pay | Admitting: Internal Medicine

## 2023-01-19 DIAGNOSIS — E118 Type 2 diabetes mellitus with unspecified complications: Secondary | ICD-10-CM

## 2023-01-19 DIAGNOSIS — I10 Essential (primary) hypertension: Secondary | ICD-10-CM

## 2023-01-25 DIAGNOSIS — F411 Generalized anxiety disorder: Secondary | ICD-10-CM | POA: Diagnosis not present

## 2023-01-25 DIAGNOSIS — F319 Bipolar disorder, unspecified: Secondary | ICD-10-CM | POA: Diagnosis not present

## 2023-01-28 DIAGNOSIS — F319 Bipolar disorder, unspecified: Secondary | ICD-10-CM | POA: Diagnosis not present

## 2023-01-28 DIAGNOSIS — F411 Generalized anxiety disorder: Secondary | ICD-10-CM | POA: Diagnosis not present

## 2023-02-14 ENCOUNTER — Other Ambulatory Visit: Payer: Self-pay | Admitting: Internal Medicine

## 2023-02-14 DIAGNOSIS — F411 Generalized anxiety disorder: Secondary | ICD-10-CM | POA: Diagnosis not present

## 2023-02-14 DIAGNOSIS — Z794 Long term (current) use of insulin: Secondary | ICD-10-CM

## 2023-02-14 DIAGNOSIS — F319 Bipolar disorder, unspecified: Secondary | ICD-10-CM | POA: Diagnosis not present

## 2023-02-21 ENCOUNTER — Other Ambulatory Visit: Payer: Self-pay | Admitting: Internal Medicine

## 2023-02-21 DIAGNOSIS — E039 Hypothyroidism, unspecified: Secondary | ICD-10-CM

## 2023-02-23 ENCOUNTER — Other Ambulatory Visit: Payer: Self-pay | Admitting: Internal Medicine

## 2023-02-23 DIAGNOSIS — E119 Type 2 diabetes mellitus without complications: Secondary | ICD-10-CM

## 2023-03-11 DIAGNOSIS — F411 Generalized anxiety disorder: Secondary | ICD-10-CM | POA: Diagnosis not present

## 2023-03-11 DIAGNOSIS — F319 Bipolar disorder, unspecified: Secondary | ICD-10-CM | POA: Diagnosis not present

## 2023-03-14 DIAGNOSIS — F411 Generalized anxiety disorder: Secondary | ICD-10-CM | POA: Diagnosis not present

## 2023-03-14 DIAGNOSIS — F319 Bipolar disorder, unspecified: Secondary | ICD-10-CM | POA: Diagnosis not present

## 2023-03-22 ENCOUNTER — Encounter: Payer: Self-pay | Admitting: Internal Medicine

## 2023-03-22 ENCOUNTER — Ambulatory Visit: Payer: PPO | Attending: Internal Medicine | Admitting: Internal Medicine

## 2023-03-22 VITALS — BP 108/60 | HR 69 | Ht 62.0 in | Wt 141.8 lb

## 2023-03-22 DIAGNOSIS — Z136 Encounter for screening for cardiovascular disorders: Secondary | ICD-10-CM | POA: Diagnosis not present

## 2023-03-22 NOTE — Progress Notes (Signed)
 Cardiology Office Note:    Date:  03/22/2023   ID:  Jessica Guzman, DOB [birth year], MRN [id number]  PCP:  Joshua Debby CROME, MD   [facility] Providers Cardiologist:  None     Referring MD: Joshua Debby CROME, MD   No chief complaint on file. Elevated CAC  History of Present Illness:    Jessica Guzman is a 63 y.o. female with a hx of PTSD, well controlled DM2, HTN, referral for elevated CAC. EKG in July showed NSR. She smoked on and off for 20 years.She walks and swims. No chest pressure or SOB. She had an episode with acute SOB prior to her CT scan.   Interval 03/22/2023 She notes that her blood pressures have been too low.  She notes that BP elevated in the office possibly related to her anxiety.   Her blood pressure is 108/60 mmHg. She had systolic blood pressures under 100.      Past Medical History:  Diagnosis Date   Bipolar 1 disorder (HCC)    Diabetes mellitus without complication (HCC)    Hyperlipidemia    Hypertension    PTSD (post-traumatic stress disorder)     Past Surgical History:  Procedure Laterality Date   PCOS     TUBAL LIGATION      Current Medications: Current Outpatient Medications on File Prior to Visit  Medication Sig Dispense Refill   aspirin 81 MG chewable tablet Chew by mouth daily.     atorvastatin  (LIPITOR) 80 MG tablet Take 1 tablet (80 mg total) by mouth at bedtime. 90 tablet 3   b complex vitamins capsule Take 1 capsule by mouth daily.     benzonatate  (TESSALON ) 100 MG capsule Take 1 capsule (100 mg total) by mouth 3 (three) times daily as needed for cough. 30 capsule 0   buPROPion  (WELLBUTRIN  XL) 150 MG 24 hr tablet Take 150 mg by mouth daily.     busPIRone  (BUSPAR ) 10 MG tablet Take 20 mg by mouth 2 (two) times daily.     carbamazepine  (CARBATROL ) 300 MG 12 hr capsule Take 300 mg by mouth at bedtime.     cholecalciferol  (VITAMIN D3) 10 MCG/ML LIQD oral liquid Take 400 Units by mouth daily.     Continuous Glucose  Receiver (FREESTYLE LIBRE 2 READER) DEVI USE AS DIRECTED. 2 each 5   Continuous Glucose Sensor (FREESTYLE LIBRE 2 SENSOR) MISC APPLY EVERY 14 DAYS. 2 each 5   gabapentin  (NEURONTIN ) 100 MG capsule Take 100-200 mg by mouth 2 (two) times daily as needed.     Glucagon  (GVOKE HYPOPEN  2-PACK) 1 MG/0.2ML SOAJ Inject 1 Act into the skin daily as needed. 2 mL 5   levothyroxine  (SYNTHROID ) 25 MCG tablet TAKE 1 TABLET(25 MCG) BY MOUTH EVERY MORNING 90 tablet 0   metFORMIN  (GLUCOPHAGE ) 500 MG tablet Take 1 tablet (500 mg total) by mouth 2 (two) times daily with a meal. 60 tablet 1   nitroGLYCERIN  (NITROSTAT ) 0.4 MG SL tablet Place 1 tablet (0.4 mg total) under the tongue every 5 (five) minutes as needed for chest pain. 50 tablet 0   olmesartan  (BENICAR ) 40 MG tablet TAKE 1 TABLET(40 MG) BY MOUTH DAILY 90 tablet 1   pregabalin  (LYRICA ) 25 MG capsule Take 1 capsule (25 mg total) by mouth 3 (three) times daily. (Patient not taking: Reported on 12/07/2022) 90 capsule 0   QUEtiapine  (SEROQUEL ) 300 MG tablet Take 300 mg by mouth daily.     valACYclovir  (VALTREX ) 500 MG tablet Take  1 tablet (500 mg total) by mouth daily. 90 tablet 0   No current facility-administered medications on file prior to visit.    Allergies:   Codeine   Social History   Socioeconomic History   Marital status: Divorced    Spouse name: Not on file   Number of children: Not on file   Years of education: Not on file   Highest education level: Bachelor's degree (e.g., BA, AB, BS)  Occupational History   Not on file  Tobacco Use   Smoking status: Some Days    Current packs/day: 0.00    Types: Cigarettes    Last attempt to quit: 02/01/2019    Years since quitting: 4.1   Smokeless tobacco: Never  Vaping Use   Vaping status: Never Used  Substance and Sexual Activity   Alcohol  use: Not Currently    Comment: rarely   Drug use: Never   Sexual activity: Not Currently  Other Topics Concern   Not on file  Social History Narrative    Lives alone   Right handed   Caffeine: 2-4 cups of coffee   Social Drivers of Health   Financial Resource Strain: Low Risk  (05/18/2022)   Overall Financial Resource Strain (CARDIA)    Difficulty of Paying Living Expenses: Not hard at all  Food Insecurity: No Food Insecurity (05/18/2022)   Hunger Vital Sign    Worried About Running Out of Food in the Last Year: Never true    Ran Out of Food in the Last Year: Never true  Transportation Needs: No Transportation Needs (05/18/2022)   PRAPARE - Administrator, Civil Service (Medical): No    Lack of Transportation (Non-Medical): No  Physical Activity: Insufficiently Active (05/18/2022)   Exercise Vital Sign    Days of Exercise per Week: 4 days    Minutes of Exercise per Session: 30 min  Stress: Stress Concern Present (05/18/2022)   [employer] of Occupational Health - Occupational Stress Questionnaire    Feeling of Stress : To some extent  Social Connections: Socially Isolated (05/18/2022)   Social Connection and Isolation Panel [NHANES]    Frequency of Communication with Friends and Family: More than three times a week    Frequency of Social Gatherings with Friends and Family: More than three times a week    Attends Religious Services: Never    Database Administrator or Organizations: No    Attends Engineer, Structural: Never    Marital Status: Divorced     Family History: The patient's family history includes Alzheimer's disease in her mother; Bipolar disorder in her brother; Diabetes in her maternal grandfather and sister; High Cholesterol in her sister; High blood pressure in her sister; Pancreatic cancer in her paternal grandfather; Stroke in her maternal grandfather. No premature CAD  ROS:   Please see the history of present illness.     All other systems reviewed and are negative.  EKGs/Labs/Other Studies Reviewed:    The following studies were reviewed today:       Recent Labs: 09/07/2022: ALT 28;  Hemoglobin 12.7; Platelets 276 11/18/2022: BUN 13; Creatinine, Ser 0.58; Potassium 5.1; Sodium 135; TSH 0.78   Recent Lipid Panel    Component Value Date/Time   CHOL 134 01/12/2023 0813   TRIG 57 01/12/2023 0813   HDL 53 01/12/2023 0813   CHOLHDL 2.5 01/12/2023 0813   CHOLHDL 3 06/21/2022 0934   VLDL 18.0 06/21/2022 0934   LDLCALC 69 01/12/2023 0813  Risk Assessment/Calculations:    Physical Exam:    VS:  Vitals:   03/22/23 1012  BP: 108/60  Pulse: 69  SpO2: 94%     Wt Readings from Last 3 Encounters:  11/18/22 143 lb (64.9 kg)  10/27/22 153 lb (69.4 kg)  09/16/22 149 lb 6.4 oz (67.8 kg)     GEN:  Well nourished, well developed in no acute distress HEENT: Normal NECK: No JVD CARDIAC: RRR, no murmurs, rubs, gallops RESPIRATORY:  Clear to auscultation without rales, wheezing or rhonchi  ABDOMEN: Soft, non-tender, non-distended MUSCULOSKELETAL:  No edema; No deformity  SKIN: Warm and dry NEUROLOGIC:  Alert and oriented x 3 PSYCHIATRIC:  Normal affect   ASSESSMENT:    Elevated CAC - asymptomatic - 226, 96th percentile, FFR negative except distal LAD which is not uncommon. -Her LDL is 69 mg/dL, can continue Lipitor 80 mg daily -Can continue aspirin 81 mg daily -We discussed continuing healthy lifestyle practices   Elevated blood pressure -She has had blood pressures that were running too low.  Will stop her olmesartan ; and recommend that she continue to monitor her her blood pressure. As long as average BPs at goal < 130/80 mmhg at home while not anxious, that is fine. If BP elevated on average, can start 20 mg of olmesartan  than increase as needed.  PLAN:    In order of problems listed above:   Follow up with an APP in 6 months           Medication Adjustments/Labs and Tests Ordered: Current medicines are reviewed at length with the patient today.  Concerns regarding medicines are outlined above.  No orders of the defined types were placed in this  encounter.  No orders of the defined types were placed in this encounter.   There are no Patient Instructions on file for this visit.   Signed, Alvan Ronal BRAVO, MD  03/22/2023 9:54 AM    [facility]

## 2023-03-22 NOTE — Patient Instructions (Addendum)
 Medication Instructions:  May hold Olmesartan  40 mg for now. May restart half dose if blood pressure is 130/80.   *If you need a refill on your cardiac medications before your next appointment, please call your pharmacy*   Lab Work: NONE ordered at this time of appointment    Testing/Procedures: NONE ordered at this time of appointment     Follow-Up: At [facility], you and your health needs are our priority.  As part of our continuing mission to provide you with exceptional heart care, we have created designated Provider Care Teams.  These Care Teams include your primary Cardiologist (physician) and Advanced Practice Providers (APPs -  Physician Assistants and Nurse Practitioners) who all work together to provide you with the care you need, when you need it.  We recommend signing up for the patient portal called MyChart.  Sign up information is provided on this After Visit Summary.  MyChart is used to connect with patients for Virtual Visits (Telemedicine).  Patients are able to view lab/test results, encounter notes, upcoming appointments, etc.  Non-urgent messages can be sent to your provider as well.   To learn more about what you can do with MyChart, go to [email or web address].    Your next appointment:   6 month(s)  Provider:   Any APP    Other Instructions

## 2023-03-29 DIAGNOSIS — F411 Generalized anxiety disorder: Secondary | ICD-10-CM | POA: Diagnosis not present

## 2023-03-29 DIAGNOSIS — F319 Bipolar disorder, unspecified: Secondary | ICD-10-CM | POA: Diagnosis not present

## 2023-04-06 ENCOUNTER — Other Ambulatory Visit: Payer: Self-pay | Admitting: Internal Medicine

## 2023-04-06 DIAGNOSIS — A6004 Herpesviral vulvovaginitis: Secondary | ICD-10-CM

## 2023-04-11 ENCOUNTER — Ambulatory Visit (INDEPENDENT_AMBULATORY_CARE_PROVIDER_SITE_OTHER): Payer: PPO

## 2023-04-11 ENCOUNTER — Other Ambulatory Visit: Payer: Self-pay | Admitting: Internal Medicine

## 2023-04-11 VITALS — BP 100/60 | HR 64 | Ht 62.0 in | Wt 142.2 lb

## 2023-04-11 DIAGNOSIS — Z1231 Encounter for screening mammogram for malignant neoplasm of breast: Secondary | ICD-10-CM | POA: Diagnosis not present

## 2023-04-11 DIAGNOSIS — Z0000 Encounter for general adult medical examination without abnormal findings: Secondary | ICD-10-CM

## 2023-04-11 DIAGNOSIS — Z124 Encounter for screening for malignant neoplasm of cervix: Secondary | ICD-10-CM | POA: Insufficient documentation

## 2023-04-11 DIAGNOSIS — Z7984 Long term (current) use of oral hypoglycemic drugs: Secondary | ICD-10-CM

## 2023-04-11 DIAGNOSIS — E118 Type 2 diabetes mellitus with unspecified complications: Secondary | ICD-10-CM

## 2023-04-11 NOTE — Progress Notes (Signed)
Subjective:   Jessica Guzman is a 63 y.o. female who presents for Medicare Annual (Subsequent) preventive examination.  Visit Complete: In person  Cardiac Risk Factors include: advanced age (>66men, >34 women);diabetes mellitus;hypertension;Other (see comment), Risk factor comments: Fibromyalgia     Objective:    Today's Vitals   04/11/23 1416  BP: 100/60  Pulse: 64  SpO2: 99%  Weight: 142 lb 3.2 oz (64.5 kg)  Height: 5\' 2"  (1.575 m)   Body mass index is 26.01 kg/m.     04/11/2023    2:26 PM 12/07/2022    2:21 PM 05/18/2022    4:45 PM 03/09/2020    2:38 PM 05/03/2018    8:31 AM 01/20/2017    8:22 PM 01/20/2017    4:56 PM  Advanced Directives  Does Patient Have a Medical Advance Directive? No No No No No No No  Would patient like information on creating a medical advance directive? No - Patient declined Yes (MAU/Ambulatory/Procedural Areas - Information given) Yes (MAU/Ambulatory/Procedural Areas - Information given)  No - Patient declined      Current Medications (verified) Outpatient Encounter Medications as of 04/11/2023  Medication Sig   aspirin 81 MG chewable tablet Chew by mouth daily.   atorvastatin (LIPITOR) 80 MG tablet Take 1 tablet (80 mg total) by mouth at bedtime.   b complex vitamins capsule Take 1 capsule by mouth daily.   buPROPion (WELLBUTRIN XL) 150 MG 24 hr tablet Take 150 mg by mouth daily.   busPIRone (BUSPAR) 10 MG tablet Take 20 mg by mouth 2 (two) times daily.   carbamazepine (CARBATROL) 300 MG 12 hr capsule Take 300 mg by mouth at bedtime.   cholecalciferol (VITAMIN D3) 10 MCG/ML LIQD oral liquid Take 400 Units by mouth daily.   Continuous Glucose Receiver (FREESTYLE LIBRE 2 READER) DEVI USE AS DIRECTED.   Continuous Glucose Sensor (FREESTYLE LIBRE 2 SENSOR) MISC APPLY EVERY 14 DAYS.   gabapentin (NEURONTIN) 100 MG capsule Take 100-200 mg by mouth 2 (two) times daily as needed.   Glucagon (GVOKE HYPOPEN 2-PACK) 1 MG/0.2ML SOAJ Inject 1 Act into  the skin daily as needed.   levothyroxine (SYNTHROID) 25 MCG tablet TAKE 1 TABLET(25 MCG) BY MOUTH EVERY MORNING   metFORMIN (GLUCOPHAGE-XR) 500 MG 24 hr tablet Take 500 mg by mouth daily with breakfast.   nitroGLYCERIN (NITROSTAT) 0.4 MG SL tablet Place 1 tablet (0.4 mg total) under the tongue every 5 (five) minutes as needed for chest pain.   olmesartan (BENICAR) 40 MG tablet TAKE 1 TABLET(40 MG) BY MOUTH DAILY (Patient taking differently: Take 40 mg by mouth daily. Patient taking 1/2 of 40 mg for now.)   pregabalin (LYRICA) 25 MG capsule Take 1 capsule (25 mg total) by mouth 3 (three) times daily.   QUEtiapine (SEROQUEL) 300 MG tablet Take 300 mg by mouth daily.   valACYclovir (VALTREX) 500 MG tablet TAKE 1 TABLET(500 MG) BY MOUTH DAILY   No facility-administered encounter medications on file as of 04/11/2023.    Allergies (verified) Codeine   History: Past Medical History:  Diagnosis Date   Bipolar 1 disorder (HCC)    Diabetes mellitus without complication (HCC)    Hyperlipidemia    Hypertension    PTSD (post-traumatic stress disorder)    Past Surgical History:  Procedure Laterality Date   PCOS     TUBAL LIGATION     Family History  Problem Relation Age of Onset   Alzheimer's disease Mother    Diabetes Sister  High Cholesterol Sister    High blood pressure Sister    Bipolar disorder Brother    Stroke Maternal Grandfather    Diabetes Maternal Grandfather    Pancreatic cancer Paternal Grandfather    Social History   Socioeconomic History   Marital status: Divorced    Spouse name: Not on file   Number of children: Not on file   Years of education: Not on file   Highest education level: Bachelor's degree (e.g., BA, AB, BS)  Occupational History   Occupation: SSDI/ RETIRED  Tobacco Use   Smoking status: Some Days    Current packs/day: 0.00    Types: Cigarettes    Last attempt to quit: 02/01/2019    Years since quitting: 4.1   Smokeless tobacco: Never  Vaping  Use   Vaping status: Never Used  Substance and Sexual Activity   Alcohol use: Not Currently    Comment: rarely   Drug use: Never   Sexual activity: Not Currently  Other Topics Concern   Not on file  Social History Narrative   Lives alone   Right handed   Caffeine: 2-4 cups of coffee   Social Drivers of Health   Financial Resource Strain: Low Risk  (04/11/2023)   Overall Financial Resource Strain (CARDIA)    Difficulty of Paying Living Expenses: Not hard at all  Food Insecurity: No Food Insecurity (04/11/2023)   Hunger Vital Sign    Worried About Running Out of Food in the Last Year: Never true    Ran Out of Food in the Last Year: Never true  Transportation Needs: No Transportation Needs (04/11/2023)   PRAPARE - Administrator, Civil Service (Medical): No    Lack of Transportation (Non-Medical): No  Physical Activity: Sufficiently Active (04/11/2023)   Exercise Vital Sign    Days of Exercise per Week: 6 days    Minutes of Exercise per Session: 60 min  Stress: Stress Concern Present (04/11/2023)   [employer] of Occupational Health - Occupational Stress Questionnaire    Feeling of Stress : To some extent  Social Connections: Moderately Integrated (04/11/2023)   Social Connection and Isolation Panel [NHANES]    Frequency of Communication with Friends and Family: More than three times a week    Frequency of Social Gatherings with Friends and Family: Three times a week    Attends Religious Services: More than 4 times per year    Active Member of Clubs or Organizations: Yes    Attends Engineer, structural: More than 4 times per year    Marital Status: Divorced    Tobacco Counseling Ready to quit: Not Answered Counseling given: Not Answered   Clinical Intake:  Pre-visit preparation completed: Yes  Pain : No/denies pain     BMI - recorded: 26.01 Nutritional Status: BMI 25 -29 Overweight Nutritional Risks: None Diabetes: Yes CBG done?: Yes (per pt-am  140) CBG resulted in Enter/ Edit results?: No Did pt. bring in CBG monitor from home?: No  How often do you need to have someone help you when you read instructions, pamphlets, or other written materials from your doctor or pharmacy?: 1 - Never     Information entered by :: Niema Carrara, RMA   Activities of Daily Living    04/11/2023    2:19 PM 05/18/2022    4:45 PM  In your present state of health, do you have any difficulty performing the following activities:  Hearing? 0 0  Vision? 0 0  Difficulty  concentrating or making decisions? 0 0  Walking or climbing stairs? 0 0  Dressing or bathing? 0 0  Doing errands, shopping? 0 0  Preparing Food and eating ? N N  Using the Toilet? N N  In the past six months, have you accidently leaked urine? N N  Do you have problems with loss of bowel control? N N  Managing your Medications? N N  Managing your Finances? N N  Housekeeping or managing your Housekeeping? N N    Patient Care Team: Etta Grandchild, MD as PCP - General (Internal Medicine)  Indicate any recent Medical Services you may have received from other than [facility] providers in the past year (date may be approximate).     Assessment:   This is a routine wellness examination for Jessica Guzman.  Hearing/Vision screen Hearing Screening - Comments:: Denies hearing difficulties   Vision Screening - Comments:: Denies vision issues.    Goals Addressed             This Visit's Progress    Remain active   On track     Depression Screen    04/11/2023    2:31 PM 12/07/2022    3:49 PM 12/07/2022    2:21 PM 11/18/2022    9:51 AM 09/15/2022   10:39 AM 06/28/2022    8:59 AM 05/18/2022    4:44 PM  PHQ 2/9 Scores  PHQ - 2 Score 0 0 0 0 1 0 0  PHQ- 9 Score 1   1 11       Fall Risk    04/11/2023    2:26 PM 12/07/2022    3:49 PM 12/07/2022    2:20 PM 09/15/2022   10:38 AM 05/18/2022   10:25 AM  Fall Risk   Falls in the past year? 0 0 0 0 0  Number falls in past yr: 0 0  0 0  Injury  with Fall? 0 0  0 0  Risk for fall due to : No Fall Risks    No Fall Risks  Follow up Falls prevention discussed;Falls evaluation completed   Falls evaluation completed Falls prevention discussed;Education provided;Falls evaluation completed    MEDICARE RISK AT HOME: Medicare Risk at Home Any stairs in or around the home?: Yes If so, are there any without handrails?: Yes Home free of loose throw rugs in walkways, pet beds, electrical cords, etc?: Yes Adequate lighting in your home to reduce risk of falls?: Yes Life alert?: No Use of a cane, walker or w/c?: No Grab bars in the bathroom?: Yes Shower chair or bench in shower?: No Elevated toilet seat or a handicapped toilet?: No  TIMED UP AND GO:  Was the test performed?  Yes  Length of time to ambulate 10 feet: 10 sec Gait steady and fast without use of assistive device    Cognitive Function:        04/11/2023    2:27 PM 05/18/2022    4:45 PM  6CIT Screen  What Year? 0 points 0 points  What month? 0 points 0 points  What time? 0 points 0 points  Count back from 20 0 points 0 points  Months in reverse 0 points 0 points  Repeat phrase 0 points 0 points  Total Score 0 points 0 points    Immunizations Immunization History  Administered Date(s) Administered   Influenza, Seasonal, Injecte, Preservative Fre 11/18/2022   Influenza,inj,Quad PF,6+ Mos 12/17/2021   Influenza-Unspecified 12/03/2020   PFIZER(Purple Top)SARS-COV-2 Vaccination 05/18/2019, 06/08/2019, 08/05/2020,  12/03/2020   Pneumococcal Conjugate-13 09/09/2020   Pneumococcal Polysaccharide-23 12/17/2021, 06/21/2022   Tdap 06/16/2016   Zoster Recombinant(Shingrix) 12/03/2020, 01/12/2021    TDAP status: Up to date  Flu Vaccine status: Up to date  Pneumococcal vaccine status: Up to date  Covid-19 vaccine status: Completed vaccines  Qualifies for Shingles Vaccine? Yes   Zostavax completed Yes   Shingrix Completed?: Yes  Screening Tests Health Maintenance   Topic Date Due   HEMOGLOBIN A1C  05/18/2023   Diabetic kidney evaluation - Urine ACR  06/21/2023   Diabetic kidney evaluation - eGFR measurement  11/18/2023   FOOT EXAM  11/18/2023   Cervical Cancer Screening (HPV/Pap Cotest)  12/12/2023   OPHTHALMOLOGY EXAM  01/04/2024   MAMMOGRAM  04/02/2024   Medicare Annual Wellness (AWV)  04/10/2024   DTaP/Tdap/Td (2 - Td or Tdap) 06/17/2026   Pneumococcal Vaccine 38-58 Years old (3 of 3 - PPSV23 or PCV20) 12/20/2027   Colonoscopy  10/22/2030   INFLUENZA VACCINE  Completed   Hepatitis C Screening  Completed   HIV Screening  Completed   Zoster Vaccines- Shingrix  Completed   HPV VACCINES  Aged Out   COVID-19 Vaccine  Discontinued    Health Maintenance  There are no preventive care reminders to display for this patient.   Colorectal cancer screening: Type of screening: Colonoscopy. Completed 10/21/20. Repeat every 10 years  Mammogram status: Ordered 04/11/2023. Pt provided with contact info and advised to call to schedule appt.   Lung Cancer Screening: (Low Dose CT Chest recommended if Age 19-80 years, 20 pack-year currently smoking OR have quit w/in 15years.) does not qualify.   Lung Cancer Screening Referral: N/A  Additional Screening:  Hepatitis C Screening: does qualify; Completed 12/17/2021  Vision Screening: Recommended annual ophthalmology exams for early detection of glaucoma and other disorders of the eye. Is the patient up to date with their annual eye exam?  Yes  Who is the provider or what is the name of the office in which the patient attends annual eye exams? [facility]  If pt is not established with a provider, would they like to be referred to a provider to establish care? No .   Dental Screening: Recommended annual dental exams for proper oral hygiene  Diabetic Foot Exam: Diabetic Foot Exam: Completed 11/18/2022  Community Resource Referral / Chronic Care Management: CRR required this visit?  No   CCM required this  visit?  No     Plan:     I have personally reviewed and noted the following in the patient's chart:   Medical and social history Use of alcohol, tobacco or illicit drugs  Current medications and supplements including opioid prescriptions. Patient is not currently taking opioid prescriptions. Functional ability and status Nutritional status Physical activity Advanced directives List of other physicians Hospitalizations, surgeries, and ER visits in previous 12 months Vitals Screenings to include cognitive, depression, and falls Referrals and appointments  In addition, I have reviewed and discussed with patient certain preventive protocols, quality metrics, and best practice recommendations. A written personalized care plan for preventive services as well as general preventive health recommendations were provided to patient.     Alem Fahl L Aubreigh Fuerte, CMA   04/11/2023   After Visit Summary: (MyChart) Due to this being a telephonic visit, the after visit summary with patients personalized plan was offered to patient via MyChart   Nurse Notes: Patient is is due for a eGFR annd a UARC during her up coming visit in April.  She is  request for Dr. Yetta Barre to place a referral for a GYN and for her to get a pap smear.  Patient had no other concerns to address today.

## 2023-04-11 NOTE — Patient Instructions (Addendum)
Jessica Guzman , Thank you for taking time to come for your Medicare Wellness Visit. I appreciate your ongoing commitment to your health goals. Please review the following plan we discussed and let me know if I can assist you in the future.   Referrals/Orders/Follow-Ups/Clinician Recommendations: It was a pleasure to meet you today.   Keep up the good work.  You have an order for:   [x]   3D Mammogram     Please call for appointment:  [facility] [address] [place], [place] [address] [phone]  Make sure to wear two-piece clothing.  No lotions, powders, or deodorants the day of the appointment. Make sure to bring picture ID and insurance card.  Bring list of medications you are currently taking including any supplements.   Schedule your [place] screening mammogram through MyChart!   Log into your MyChart account.  Go to 'Visit' (or 'Appointments' if on mobile App) --> Schedule an Appointment  Under 'Select a Reason for Visit' choose the Mammogram Screening option.  Complete the pre-visit questions and select the time and place that best fits your schedule.    This is a list of the screening recommended for you and due dates:  Health Maintenance  Topic Date Due   Medicare Annual Wellness Visit  05/18/2023   Hemoglobin A1C  05/18/2023   Yearly kidney health urinalysis for diabetes  06/21/2023   Yearly kidney function blood test for diabetes  11/18/2023   Complete foot exam   11/18/2023   Pap with HPV screening  12/12/2023   Eye exam for diabetics  01/04/2024   Mammogram  04/02/2024   DTaP/Tdap/Td vaccine (2 - Td or Tdap) 06/17/2026   Pneumococcal Vaccination (3 of 3 - PPSV23 or PCV20) 12/20/2027   Colon Cancer Screening  10/22/2030   Flu Shot  Completed   Hepatitis C Screening  Completed   HIV Screening  Completed   Zoster (Shingles) Vaccine  Completed   HPV Vaccine  Aged Out   COVID-19 Vaccine  Discontinued    Advanced directives:  (Copy Requested) Please bring a copy of your health care power of attorney and living will to the office to be added to your chart at your convenience.  Next Medicare Annual Wellness Visit scheduled for next year: Yes

## 2023-04-19 DIAGNOSIS — F319 Bipolar disorder, unspecified: Secondary | ICD-10-CM | POA: Diagnosis not present

## 2023-04-19 DIAGNOSIS — F411 Generalized anxiety disorder: Secondary | ICD-10-CM | POA: Diagnosis not present

## 2023-04-26 DIAGNOSIS — F319 Bipolar disorder, unspecified: Secondary | ICD-10-CM | POA: Diagnosis not present

## 2023-04-26 DIAGNOSIS — F411 Generalized anxiety disorder: Secondary | ICD-10-CM | POA: Diagnosis not present

## 2023-04-28 ENCOUNTER — Ambulatory Visit
Admission: RE | Admit: 2023-04-28 | Discharge: 2023-04-28 | Disposition: A | Discharge status: Home / Self Care | Payer: PPO | Source: Ambulatory Visit | Attending: Internal Medicine | Admitting: Internal Medicine

## 2023-04-28 DIAGNOSIS — Z1231 Encounter for screening mammogram for malignant neoplasm of breast: Secondary | ICD-10-CM

## 2023-05-06 ENCOUNTER — Other Ambulatory Visit: Payer: Self-pay | Admitting: Internal Medicine

## 2023-05-09 ENCOUNTER — Telehealth: Payer: Self-pay | Admitting: Internal Medicine

## 2023-05-09 NOTE — Telephone Encounter (Signed)
 Copied from CRM [id number]. Topic: Clinical - Prescription Issue >> May 09, 2023 11:00 AM [name] wrote: Reason for CRM: Patient called in regarding her metFORMIN (GLUCOPHAGE-XR) 500 MG 24 hr tablet. Patient got a message from her Pharmacy saying that more refills of this medication have not been approved by her PCP. Patient called in to see if she needs to make an appointment to get approved for more refills.

## 2023-05-10 DIAGNOSIS — F319 Bipolar disorder, unspecified: Secondary | ICD-10-CM | POA: Diagnosis not present

## 2023-05-10 DIAGNOSIS — F411 Generalized anxiety disorder: Secondary | ICD-10-CM | POA: Diagnosis not present

## 2023-05-10 NOTE — Telephone Encounter (Signed)
 Medication has been refilled.

## 2023-05-17 DIAGNOSIS — F411 Generalized anxiety disorder: Secondary | ICD-10-CM | POA: Diagnosis not present

## 2023-05-17 DIAGNOSIS — F319 Bipolar disorder, unspecified: Secondary | ICD-10-CM | POA: Diagnosis not present

## 2023-05-19 ENCOUNTER — Encounter: Payer: Self-pay | Admitting: Obstetrics and Gynecology

## 2023-05-19 ENCOUNTER — Other Ambulatory Visit (HOSPITAL_COMMUNITY)
Admission: RE | Admit: 2023-05-19 | Discharge: 2023-05-19 | Disposition: A | Discharge status: Home / Self Care | Source: Ambulatory Visit | Attending: Obstetrics and Gynecology | Admitting: Obstetrics and Gynecology

## 2023-05-19 ENCOUNTER — Ambulatory Visit (INDEPENDENT_AMBULATORY_CARE_PROVIDER_SITE_OTHER): Payer: PPO | Admitting: Obstetrics and Gynecology

## 2023-05-19 VITALS — BP 118/60 | HR 75 | Temp 98.4°F | Ht 63.0 in | Wt 138.0 lb

## 2023-05-19 DIAGNOSIS — Z1151 Encounter for screening for human papillomavirus (HPV): Secondary | ICD-10-CM | POA: Diagnosis not present

## 2023-05-19 DIAGNOSIS — Z124 Encounter for screening for malignant neoplasm of cervix: Secondary | ICD-10-CM | POA: Diagnosis not present

## 2023-05-19 DIAGNOSIS — Z01419 Encounter for gynecological examination (general) (routine) without abnormal findings: Secondary | ICD-10-CM | POA: Insufficient documentation

## 2023-05-19 DIAGNOSIS — Z9189 Other specified personal risk factors, not elsewhere classified: Secondary | ICD-10-CM | POA: Diagnosis not present

## 2023-05-19 DIAGNOSIS — B009 Herpesviral infection, unspecified: Secondary | ICD-10-CM

## 2023-05-19 DIAGNOSIS — Z87410 Personal history of cervical dysplasia: Secondary | ICD-10-CM | POA: Insufficient documentation

## 2023-05-19 NOTE — Assessment & Plan Note (Signed)
 Cervical cancer screening performed according to ASCCP guidelines. Encouraged annual mammogram screening Colonoscopy UTD DXA N/A Labs and immunizations with her primary Encouraged safe sexual practices as indicated Encouraged healthy lifestyle practices with diet and exercise For patients under 50-61yo, I recommend 1200mg  calcium daily and 600IU of vitamin D daily.

## 2023-05-19 NOTE — Progress Notes (Signed)
 63 y.o. [id number] postmenopausal female with history of CIN-3 (LEEP in 2017 with negative margins), HSV and multiple comorbidities here for annual exam-high risk Medicare. Divorced, boyfriend x 18 months.  From [place] originally.  No complaints doing well.  Postmenopausal bleeding: None Pelvic discharge or pain: None Breast mass, nipple discharge or skin changes : None Last PAP:     Component Value Date/Time   DIAGPAP  01/01/2019 0937    - Negative for intraepithelial lesion or malignancy (NILM)   DIAGPAP  12/19/2017 0000    ATYPICAL SQUAMOUS CELLS OF UNDETERMINED SIGNIFICANCE (ASC-US).   ADEQPAP  01/01/2019 0937    Satisfactory for evaluation; transformation zone component PRESENT.   ADEQPAP  12/19/2017 0000    Satisfactory for evaluation  endocervical/transformation zone component PRESENT.   Last mammogram: 04/28/2023 BI-RADS 1, density a Last colonoscopy: 04/30/2019 Last DXA: Never Sexually active: Yes, female partner, on and off dating for 7 years.  Declined STD testing. Exercising: Yes, bike.  Walks and swims. Smoker: Former, quit in 2020  GYN HISTORY: CIN-3, in 2017 status post LEEP No family history of hip fracture  OB History  Gravida Para Term Preterm AB Living  4    2 2   SAB IAB Ectopic Multiple Live Births  2    2    # Outcome Date GA Lbr Len/2nd Weight Sex Type Anes PTL Lv  4 Gravida           3 Gravida           2 SAB           1 SAB             Past Medical History:  Diagnosis Date   Bipolar 1 disorder (HCC)    Diabetes mellitus without complication (HCC)    Hyperlipidemia    Hypertension    PTSD (post-traumatic stress disorder)     Past Surgical History:  Procedure Laterality Date   PCOS     TUBAL LIGATION      Current Outpatient Medications on File Prior to Visit  Medication Sig Dispense Refill   aspirin 81 MG chewable tablet Chew by mouth daily.     atorvastatin (LIPITOR) 80 MG tablet Take 1 tablet (80 mg total) by mouth at  bedtime. 90 tablet 3   b complex vitamins capsule Take 1 capsule by mouth daily.     buPROPion (WELLBUTRIN XL) 150 MG 24 hr tablet Take 150 mg by mouth daily.     busPIRone (BUSPAR) 10 MG tablet Take 20 mg by mouth 2 (two) times daily.     carbamazepine (CARBATROL) 300 MG 12 hr capsule Take 200 mg by mouth at bedtime.     cholecalciferol (VITAMIN D3) 10 MCG/ML LIQD oral liquid Take 400 Units by mouth daily.     Continuous Glucose Receiver (FREESTYLE LIBRE 2 READER) DEVI USE AS DIRECTED. 2 each 5   Continuous Glucose Sensor (FREESTYLE LIBRE 2 SENSOR) MISC APPLY EVERY 14 DAYS. 2 each 5   gabapentin (NEURONTIN) 100 MG capsule Take 100-200 mg by mouth 2 (two) times daily as needed.     Glucagon (GVOKE HYPOPEN 2-PACK) 1 MG/0.2ML SOAJ Inject 1 Act into the skin daily as needed. 2 mL 5   levothyroxine (SYNTHROID) 25 MCG tablet TAKE 1 TABLET(25 MCG) BY MOUTH EVERY MORNING 90 tablet 0   metFORMIN (GLUCOPHAGE-XR) 500 MG 24 hr tablet TAKE 1 TABLET(500 MG) BY MOUTH DAILY WITH BREAKFAST 90 tablet 0   nitroGLYCERIN (NITROSTAT)  0.4 MG SL tablet Place 1 tablet (0.4 mg total) under the tongue every 5 (five) minutes as needed for chest pain. 50 tablet 0   olmesartan (BENICAR) 40 MG tablet TAKE 1 TABLET(40 MG) BY MOUTH DAILY (Patient taking differently: Take 40 mg by mouth daily. Patient taking 1/2 of 40 mg for now.) 90 tablet 1   QUEtiapine (SEROQUEL) 300 MG tablet Take 300 mg by mouth daily.     valACYclovir (VALTREX) 500 MG tablet TAKE 1 TABLET(500 MG) BY MOUTH DAILY 90 tablet 0   No current facility-administered medications on file prior to visit.    Social History   Socioeconomic History   Marital status: Divorced    Spouse name: Not on file   Number of children: Not on file   Years of education: Not on file   Highest education level: Bachelor's degree (e.g., BA, AB, BS)  Occupational History   Occupation: SSDI/ RETIRED  Tobacco Use   Smoking status: Former    Current packs/day: 0.00    Types:  Cigarettes    Quit date: 02/01/2019    Years since quitting: 4.2   Smokeless tobacco: Never  Vaping Use   Vaping status: Never Used  Substance and Sexual Activity   Alcohol use: Not Currently    Comment: rarely   Drug use: Never   Sexual activity: Yes  Other Topics Concern   Not on file  Social History Narrative   Lives alone   Right handed   Caffeine: 2-4 cups of coffee   Social Drivers of Corporate investment banker Strain: Low Risk  (04/11/2023)   Overall Financial Resource Strain (CARDIA)    Difficulty of Paying Living Expenses: Not hard at all  Food Insecurity: No Food Insecurity (04/11/2023)   Hunger Vital Sign    Worried About Running Out of Food in the Last Year: Never true    Ran Out of Food in the Last Year: Never true  Transportation Needs: No Transportation Needs (04/11/2023)   PRAPARE - Administrator, Civil Service (Medical): No    Lack of Transportation (Non-Medical): No  Physical Activity: Sufficiently Active (04/11/2023)   Exercise Vital Sign    Days of Exercise per Week: 6 days    Minutes of Exercise per Session: 60 min  Stress: Stress Concern Present (04/11/2023)   [employer] of Occupational Health - Occupational Stress Questionnaire    Feeling of Stress : To some extent  Social Connections: Moderately Integrated (04/11/2023)   Social Connection and Isolation Panel [NHANES]    Frequency of Communication with Friends and Family: More than three times a week    Frequency of Social Gatherings with Friends and Family: Three times a week    Attends Religious Services: More than 4 times per year    Active Member of Clubs or Organizations: Yes    Attends Banker Meetings: More than 4 times per year    Marital Status: Divorced  Intimate Partner Violence: Patient Unable To Answer (04/11/2023)   Humiliation, Afraid, Rape, and Kick questionnaire    Fear of Current or Ex-Partner: Patient unable to answer    Emotionally Abused: Patient unable to  answer    Physically Abused: Patient unable to answer    Sexually Abused: Patient unable to answer    Family History  Problem Relation Age of Onset   Alzheimer's disease Mother    Diabetes Sister    High Cholesterol Sister    High blood pressure Sister  Bipolar disorder Brother    Stroke Maternal Grandfather    Diabetes Maternal Grandfather    Pancreatic cancer Paternal Grandfather     Allergies  Allergen Reactions   Codeine Nausea And Vomiting      PE Today's Vitals   05/19/23 1505  BP: 118/60  Pulse: 75  Temp: 98.4 F (36.9 C)  TempSrc: Oral  SpO2: 97%  Weight: 138 lb (62.6 kg)  Height: 5\' 3"  (1.6 m)   Body mass index is 24.45 kg/m.  Physical Exam Vitals reviewed. Exam conducted with a chaperone present.  Constitutional:      General: She is not in acute distress.    Appearance: Normal appearance.  HENT:     Head: Normocephalic and atraumatic.     Nose: Nose normal.  Eyes:     Extraocular Movements: Extraocular movements intact.     Conjunctiva/sclera: Conjunctivae normal.  Neck:     Thyroid: No thyroid mass, thyromegaly or thyroid tenderness.  Pulmonary:     Effort: Pulmonary effort is normal.  Chest:     Chest wall: No mass or tenderness.  Breasts:    Right: Normal. No swelling, mass, nipple discharge, skin change or tenderness.     Left: Normal. No swelling, mass, nipple discharge, skin change or tenderness.  Abdominal:     General: There is no distension.     Palpations: Abdomen is soft.     Tenderness: There is no abdominal tenderness.  Genitourinary:    General: Normal vulva.     Exam position: Lithotomy position.     Urethra: No prolapse.     Vagina: Normal. No vaginal discharge or bleeding.     Cervix: Normal. No lesion.     Uterus: Normal. Not enlarged and not tender.      Adnexa: Right adnexa normal and left adnexa normal.  Musculoskeletal:        General: Normal range of motion.     Cervical back: Normal range of motion.   Lymphadenopathy:     Upper Body:     Right upper body: No axillary adenopathy.     Left upper body: No axillary adenopathy.     Lower Body: No right inguinal adenopathy. No left inguinal adenopathy.  Skin:    General: Skin is warm and dry.  Neurological:     General: No focal deficit present.     Mental Status: She is alert.  Psychiatric:        Mood and Affect: Mood normal.        Behavior: Behavior normal.      Assessment and Plan:        Well woman exam with routine gynecological exam Assessment & Plan: Cervical cancer screening performed according to ASCCP guidelines. Encouraged annual mammogram screening Colonoscopy UTD DXA N/A Labs and immunizations with her primary Encouraged safe sexual practices as indicated Encouraged healthy lifestyle practices with diet and exercise For patients under 50-70yo, I recommend 1200mg  calcium daily and 600IU of vitamin D daily.    Cervical cancer screening -     Cytology - PAP  History of cervical dysplasia  Cervical cancer screening performed as indicated.  Rosalyn Gess, MD

## 2023-05-19 NOTE — Patient Instructions (Signed)

## 2023-05-20 DIAGNOSIS — J3489 Other specified disorders of nose and nasal sinuses: Secondary | ICD-10-CM | POA: Diagnosis not present

## 2023-05-20 DIAGNOSIS — Z20822 Contact with and (suspected) exposure to covid-19: Secondary | ICD-10-CM | POA: Diagnosis not present

## 2023-05-24 LAB — CYTOLOGY - PAP
Comment: NEGATIVE
Comment: NEGATIVE
Comment: NEGATIVE
HPV 16: NEGATIVE
HPV 18 / 45: NEGATIVE
High risk HPV: POSITIVE — AB

## 2023-05-26 ENCOUNTER — Other Ambulatory Visit: Payer: Self-pay

## 2023-05-26 DIAGNOSIS — R87612 Low grade squamous intraepithelial lesion on cytologic smear of cervix (LGSIL): Secondary | ICD-10-CM

## 2023-05-31 DIAGNOSIS — F411 Generalized anxiety disorder: Secondary | ICD-10-CM | POA: Diagnosis not present

## 2023-05-31 DIAGNOSIS — F319 Bipolar disorder, unspecified: Secondary | ICD-10-CM | POA: Diagnosis not present

## 2023-06-07 DIAGNOSIS — F319 Bipolar disorder, unspecified: Secondary | ICD-10-CM | POA: Diagnosis not present

## 2023-06-07 DIAGNOSIS — F411 Generalized anxiety disorder: Secondary | ICD-10-CM | POA: Diagnosis not present

## 2023-06-09 ENCOUNTER — Ambulatory Visit: Payer: PPO | Admitting: Internal Medicine

## 2023-06-09 ENCOUNTER — Encounter: Payer: Self-pay | Admitting: Internal Medicine

## 2023-06-09 VITALS — BP 124/80 | HR 61 | Temp 98.6°F | Resp 16 | Ht 63.0 in | Wt 139.4 lb

## 2023-06-09 DIAGNOSIS — I10 Essential (primary) hypertension: Secondary | ICD-10-CM

## 2023-06-09 DIAGNOSIS — E785 Hyperlipidemia, unspecified: Secondary | ICD-10-CM | POA: Diagnosis not present

## 2023-06-09 DIAGNOSIS — E118 Type 2 diabetes mellitus with unspecified complications: Secondary | ICD-10-CM | POA: Diagnosis not present

## 2023-06-09 DIAGNOSIS — Z7984 Long term (current) use of oral hypoglycemic drugs: Secondary | ICD-10-CM

## 2023-06-09 DIAGNOSIS — Z114 Encounter for screening for human immunodeficiency virus [HIV]: Secondary | ICD-10-CM | POA: Insufficient documentation

## 2023-06-09 LAB — CBC WITH DIFFERENTIAL/PLATELET
Basophils Absolute: 0 10*3/uL (ref 0.0–0.1)
Basophils Relative: 0.7 % (ref 0.0–3.0)
Eosinophils Absolute: 0.2 10*3/uL (ref 0.0–0.7)
Eosinophils Relative: 3.2 % (ref 0.0–5.0)
HCT: 41.8 % (ref 36.0–46.0)
Hemoglobin: 14 g/dL (ref 12.0–15.0)
Lymphocytes Relative: 41.4 % (ref 12.0–46.0)
Lymphs Abs: 2.6 10*3/uL (ref 0.7–4.0)
MCHC: 33.6 g/dL (ref 30.0–36.0)
MCV: 88.8 fl (ref 78.0–100.0)
Monocytes Absolute: 0.5 10*3/uL (ref 0.1–1.0)
Monocytes Relative: 8.2 % (ref 3.0–12.0)
Neutro Abs: 2.9 10*3/uL (ref 1.4–7.7)
Neutrophils Relative %: 46.5 % (ref 43.0–77.0)
Platelets: 243 10*3/uL (ref 150.0–400.0)
RBC: 4.7 Mil/uL (ref 3.87–5.11)
RDW: 14.1 % (ref 11.5–15.5)
WBC: 6.2 10*3/uL (ref 4.0–10.5)

## 2023-06-09 LAB — BASIC METABOLIC PANEL WITH GFR
BUN: 12 mg/dL (ref 6–23)
CO2: 31 meq/L (ref 19–32)
Calcium: 9.8 mg/dL (ref 8.4–10.5)
Chloride: 99 meq/L (ref 96–112)
Creatinine, Ser: 0.61 mg/dL (ref 0.40–1.20)
GFR: 97.14 mL/min (ref >60.00)
Glucose, Bld: 115 mg/dL — ABNORMAL HIGH (ref 70–99)
Potassium: 4.7 meq/L (ref 3.5–5.1)
Sodium: 134 meq/L — ABNORMAL LOW (ref 135–145)

## 2023-06-09 LAB — URINALYSIS, ROUTINE W REFLEX MICROSCOPIC
Bilirubin Urine: NEGATIVE
Hgb urine dipstick: NEGATIVE
Ketones, ur: NEGATIVE
Nitrite: NEGATIVE
Specific Gravity, Urine: 1.01 (ref 1.000–1.030)
Total Protein, Urine: NEGATIVE
Urine Glucose: NEGATIVE
Urobilinogen, UA: 0.2 (ref 0.0–1.0)
pH: 7 (ref 5.0–8.0)

## 2023-06-09 LAB — HEPATIC FUNCTION PANEL
ALT: 39 U/L — ABNORMAL HIGH (ref 0–35)
AST: 31 U/L (ref 0–37)
Albumin: 4.5 g/dL (ref 3.5–5.2)
Alkaline Phosphatase: 71 U/L (ref 39–117)
Bilirubin, Direct: 0.1 mg/dL (ref 0.0–0.3)
Total Bilirubin: 0.5 mg/dL (ref 0.2–1.2)
Total Protein: 7.1 g/dL (ref 6.0–8.3)

## 2023-06-09 LAB — MICROALBUMIN / CREATININE URINE RATIO
Creatinine,U: 33.4 mg/dL
Microalb Creat Ratio: UNDETERMINED mg/g (ref 0.0–30.0)
Microalb, Ur: <0.7 mg/dL

## 2023-06-09 LAB — TSH: TSH: 1.05 u[IU]/mL (ref 0.35–5.50)

## 2023-06-09 LAB — HEMOGLOBIN A1C: Hgb A1c MFr Bld: 6.2 % (ref 4.6–6.5)

## 2023-06-09 MED ORDER — OLMESARTAN MEDOXOMIL 20 MG PO TABS: 20.0000 mg | ORAL_TABLET | Freq: Every day | ORAL | 1 refills | Status: DC

## 2023-06-09 NOTE — Progress Notes (Unsigned)
 Subjective:  Patient ID: Jessica Guzman, female    DOB: 01/14/1961  Age: 63 y.o. MRN: [id number]  CC: Hypertension, Hypothyroidism, Diabetes, and Hyperlipidemia   HPI [name] presents for f/up ----  Discussed the use of AI scribe software for clinical note transcription with the patient, who gave verbal consent to proceed.  History of Present Illness   Jessica Guzman is a 63 year old female who presents for follow-up regarding her cardiovascular health.  She had a consultation with her cardiologist in January, where she was informed that she does not currently have coronary artery disease, as she has not experienced any cardiac events or required interventions, despite having a blockage.  No typical chest pain or shortness of breath. She occasionally experiences dizziness or lightheadedness, which she attributes to various causes, including low blood sugar. She recalls an episode of dizziness when her blood pressure medication dose was too high, leading to low blood pressure.  Her current blood pressure medication is at a dose of 20 milligrams, after trying a period without medication. She regularly monitors her blood pressure and has provided a list of her typical readings for review.      She wants to be tested for HIV.  Outpatient Medications Prior to Visit  Medication Sig Dispense Refill   aspirin 81 MG chewable tablet Chew by mouth daily.     atorvastatin (LIPITOR) 80 MG tablet Take 1 tablet (80 mg total) by mouth at bedtime. 90 tablet 3   b complex vitamins capsule Take 1 capsule by mouth daily.     buPROPion (WELLBUTRIN XL) 150 MG 24 hr tablet Take 150 mg by mouth daily.     busPIRone (BUSPAR) 10 MG tablet Take 20 mg by mouth 2 (two) times daily.     carbamazepine (CARBATROL) 300 MG 12 hr capsule Take 200 mg by mouth at bedtime.     Continuous Glucose Receiver (FREESTYLE LIBRE 2 READER) DEVI USE AS DIRECTED. 2 each 5   Continuous Glucose Sensor  (FREESTYLE LIBRE 2 SENSOR) MISC APPLY EVERY 14 DAYS. 2 each 5   gabapentin (NEURONTIN) 100 MG capsule Take 100-200 mg by mouth daily.     Glucagon (GVOKE HYPOPEN 2-PACK) 1 MG/0.2ML SOAJ Inject 1 Act into the skin daily as needed. 2 mL 5   levothyroxine (SYNTHROID) 25 MCG tablet TAKE 1 TABLET(25 MCG) BY MOUTH EVERY MORNING 90 tablet 0   metFORMIN (GLUCOPHAGE-XR) 500 MG 24 hr tablet TAKE 1 TABLET(500 MG) BY MOUTH DAILY WITH BREAKFAST 90 tablet 0   nitroGLYCERIN (NITROSTAT) 0.4 MG SL tablet Place 1 tablet (0.4 mg total) under the tongue every 5 (five) minutes as needed for chest pain. 50 tablet 0   QUEtiapine (SEROQUEL) 300 MG tablet Take 300 mg by mouth daily.     valACYclovir (VALTREX) 500 MG tablet TAKE 1 TABLET(500 MG) BY MOUTH DAILY 90 tablet 0   VITAMIN D PO Take 2 tablets by mouth daily.     olmesartan (BENICAR) 40 MG tablet TAKE 1 TABLET(40 MG) BY MOUTH DAILY (Patient taking differently: Take 20 mg by mouth daily.) 90 tablet 1   cholecalciferol (VITAMIN D3) 10 MCG/ML LIQD oral liquid Take 400 Units by mouth daily.     No facility-administered medications prior to visit.    ROS Review of Systems  Constitutional: Negative.  Negative for chills, diaphoresis and fatigue.  Eyes: Negative.   Respiratory:  Negative for cough, chest tightness, shortness of breath and wheezing.   Cardiovascular:  Negative for  chest pain, palpitations and leg swelling.  Gastrointestinal: Negative.  Negative for abdominal pain, constipation, diarrhea, nausea and vomiting.  Endocrine: Negative.   Genitourinary: Negative.  Negative for difficulty urinating.  Musculoskeletal:  Negative for arthralgias, back pain and myalgias.  Skin: Negative.   Neurological:  Negative for dizziness, weakness and light-headedness.  Hematological:  Negative for adenopathy. Does not bruise/bleed easily.  Psychiatric/Behavioral: Negative.      Objective:  BP 124/80 (BP Location: Left Arm, Patient Position: Sitting, Cuff Size:  Small)   Pulse 61   Temp 98.6 F (37 C) (Oral)   Resp 16   Ht 5\' 3"  (1.6 m)   Wt 139 lb 6.4 oz (63.2 kg)   SpO2 99%   BMI 24.69 kg/m   BP Readings from Last 3 Encounters:  06/09/23 124/80  05/19/23 118/60  04/11/23 100/60    Wt Readings from Last 3 Encounters:  06/09/23 139 lb 6.4 oz (63.2 kg)  05/19/23 138 lb (62.6 kg)  04/11/23 142 lb 3.2 oz (64.5 kg)    Physical Exam Vitals reviewed.  Constitutional:      Appearance: Normal appearance.  HENT:     Nose: Nose normal.     Mouth/Throat:     Mouth: Mucous membranes are moist.  Eyes:     General: No scleral icterus.    Conjunctiva/sclera: Conjunctivae normal.  Cardiovascular:     Rate and Rhythm: Normal rate and regular rhythm.     Heart sounds: Murmur heard.     Systolic murmur is present with a grade of 1/6.     No friction rub. No gallop.  Pulmonary:     Effort: Pulmonary effort is normal.     Breath sounds: No stridor. No wheezing, rhonchi or rales.  Abdominal:     General: Abdomen is flat.     Palpations: There is no mass.     Tenderness: There is no abdominal tenderness. There is no guarding.     Hernia: No hernia is present.  Musculoskeletal:        General: Normal range of motion.     Cervical back: Neck supple.     Right lower leg: No edema.     Left lower leg: No edema.  Lymphadenopathy:     Cervical: No cervical adenopathy.  Skin:    General: Skin is warm and dry.  Neurological:     General: No focal deficit present.     Mental Status: She is alert.  Psychiatric:        Mood and Affect: Mood normal.        Behavior: Behavior normal.     Lab Results  Component Value Date   WBC 6.2 06/09/2023   HGB 14.0 06/09/2023   HCT 41.8 06/09/2023   PLT 243.0 06/09/2023   GLUCOSE 115 (H) 06/09/2023   CHOL 134 01/12/2023   TRIG 57 01/12/2023   HDL 53 01/12/2023   LDLCALC 69 01/12/2023   ALT 39 (H) 06/09/2023   AST 31 06/09/2023   NA 134 (L) 06/09/2023   K 4.7 06/09/2023   CL 99 06/09/2023    CREATININE 0.61 06/09/2023   BUN 12 06/09/2023   CO2 31 06/09/2023   TSH 1.05 06/09/2023   INR 1.0 09/07/2022   HGBA1C 6.2 06/09/2023   MICROALBUR <0.7 06/09/2023    No results found.  Assessment & Plan:   Type II diabetes mellitus with complication (HCC)- Blood sugar is well controlled. -     Basic metabolic panel with GFR;  Future -     Urinalysis, Routine w reflex microscopic; Future -     Hemoglobin A1c; Future -     Microalbumin / creatinine urine ratio; Future -     Hepatic function panel; Future -     Olmesartan Medoxomil; Take 1 tablet (20 mg total) by mouth daily.  Dispense: 90 tablet; Refill: 1  Primary hypertension- BP is well controlled. -     Basic metabolic panel with GFR; Future -     CBC with Differential/Platelet; Future -     Urinalysis, Routine w reflex microscopic; Future -     TSH; Future -     Hepatic function panel; Future -     Olmesartan Medoxomil; Take 1 tablet (20 mg total) by mouth daily.  Dispense: 90 tablet; Refill: 1  Dyslipidemia, goal LDL below 100 - LDL goal achieved. Doing well on the statin  -     TSH; Future -     Hepatic function panel; Future  Encounter for screening for HIV -     HIV Antibody (routine testing w rflx); Future     Follow-up: Return in about 6 months (around 12/09/2023).  Sanda Linger, MD

## 2023-06-09 NOTE — Patient Instructions (Signed)

## 2023-06-10 ENCOUNTER — Encounter: Payer: Self-pay | Admitting: Internal Medicine

## 2023-06-10 LAB — HIV ANTIBODY (ROUTINE TESTING W REFLEX): HIV 1&2 Ab, 4th Generation: NONREACTIVE

## 2023-06-14 DIAGNOSIS — F319 Bipolar disorder, unspecified: Secondary | ICD-10-CM | POA: Diagnosis not present

## 2023-06-14 DIAGNOSIS — F411 Generalized anxiety disorder: Secondary | ICD-10-CM | POA: Diagnosis not present

## 2023-06-27 ENCOUNTER — Encounter: Payer: Self-pay | Admitting: Internal Medicine

## 2023-06-28 DIAGNOSIS — F319 Bipolar disorder, unspecified: Secondary | ICD-10-CM | POA: Diagnosis not present

## 2023-06-28 DIAGNOSIS — F411 Generalized anxiety disorder: Secondary | ICD-10-CM | POA: Diagnosis not present

## 2023-07-07 ENCOUNTER — Encounter: Payer: Self-pay | Admitting: Radiology

## 2023-07-07 ENCOUNTER — Other Ambulatory Visit (HOSPITAL_COMMUNITY)
Admission: RE | Admit: 2023-07-07 | Discharge: 2023-07-07 | Disposition: A | Discharge status: Home / Self Care | Source: Ambulatory Visit | Attending: Radiology | Admitting: Radiology

## 2023-07-07 ENCOUNTER — Ambulatory Visit (INDEPENDENT_AMBULATORY_CARE_PROVIDER_SITE_OTHER): Admitting: Radiology

## 2023-07-07 VITALS — BP 112/60 | HR 66

## 2023-07-07 DIAGNOSIS — R87622 Low grade squamous intraepithelial lesion on cytologic smear of vagina (LGSIL): Secondary | ICD-10-CM | POA: Insufficient documentation

## 2023-07-07 DIAGNOSIS — R8789 Other abnormal findings in specimens from female genital organs: Secondary | ICD-10-CM

## 2023-07-07 DIAGNOSIS — R87612 Low grade squamous intraepithelial lesion on cytologic smear of cervix (LGSIL): Secondary | ICD-10-CM | POA: Diagnosis not present

## 2023-07-07 DIAGNOSIS — R87810 Cervical high risk human papillomavirus (HPV) DNA test positive: Secondary | ICD-10-CM | POA: Diagnosis not present

## 2023-07-07 NOTE — Progress Notes (Signed)
 63 y.o. [id number] female here for colposcopy. LSIL HPV+ pap, LEEP 2017.  No LMP recorded. Patient is postmenopausal.     Last PAP:    Component Value Date/Time   DIAGPAP - Low grade squamous intraepithelial lesion (LSIL) (A) 05/19/2023 1515   DIAGPAP  01/01/2019 0937    - Negative for intraepithelial lesion or malignancy (NILM)   DIAGPAP  12/19/2017 0000    ATYPICAL SQUAMOUS CELLS OF UNDETERMINED SIGNIFICANCE (ASC-US ).   HPVHIGH Positive (A) 05/19/2023 1515   ADEQPAP  05/19/2023 1515    Satisfactory for evaluation; transformation zone component PRESENT.   ADEQPAP  01/01/2019 0937    Satisfactory for evaluation; transformation zone component PRESENT.   ADEQPAP  12/19/2017 0000    Satisfactory for evaluation  endocervical/transformation zone component PRESENT.     OB History  Gravida Para Term Preterm AB Living  4    2 2   SAB IAB Ectopic Multiple Live Births  2    2    # Outcome Date GA Lbr Len/2nd Weight Sex Type Anes PTL Lv  4 Gravida           3 Gravida           2 SAB           1 SAB             Past Medical History:  Diagnosis Date   Bipolar 1 disorder (HCC)    Diabetes mellitus without complication (HCC)    Hyperlipidemia    Hypertension    PTSD (post-traumatic stress disorder)     Past Surgical History:  Procedure Laterality Date   PCOS     TUBAL LIGATION      Current Outpatient Medications on File Prior to Visit  Medication Sig Dispense Refill   aspirin 81 MG chewable tablet Chew by mouth daily.     atorvastatin  (LIPITOR) 80 MG tablet Take 1 tablet (80 mg total) by mouth at bedtime. 90 tablet 3   b complex vitamins capsule Take 1 capsule by mouth daily.     buPROPion (WELLBUTRIN XL) 150 MG 24 hr tablet Take 150 mg by mouth daily.     busPIRone (BUSPAR) 10 MG tablet Take 20 mg by mouth 2 (two) times daily.     carbamazepine  (CARBATROL ) 300 MG 12 hr capsule Take 200 mg by mouth at bedtime.     Continuous Glucose Receiver (FREESTYLE LIBRE 2 READER)  DEVI USE AS DIRECTED. 2 each 5   Continuous Glucose Sensor (FREESTYLE LIBRE 2 SENSOR) MISC APPLY EVERY 14 DAYS. 2 each 5   gabapentin (NEURONTIN) 100 MG capsule Take 100-200 mg by mouth daily.     Glucagon  (GVOKE HYPOPEN  2-PACK) 1 MG/0.2ML SOAJ Inject 1 Act into the skin daily as needed. 2 mL 5   levothyroxine  (SYNTHROID ) 25 MCG tablet TAKE 1 TABLET(25 MCG) BY MOUTH EVERY MORNING 90 tablet 0   metFORMIN  (GLUCOPHAGE -XR) 500 MG 24 hr tablet TAKE 1 TABLET(500 MG) BY MOUTH DAILY WITH BREAKFAST 90 tablet 0   nitroGLYCERIN  (NITROSTAT ) 0.4 MG SL tablet Place 1 tablet (0.4 mg total) under the tongue every 5 (five) minutes as needed for chest pain. 50 tablet 0   olmesartan  (BENICAR ) 20 MG tablet Take 1 tablet (20 mg total) by mouth daily. 90 tablet 1   QUEtiapine  (SEROQUEL ) 300 MG tablet Take 300 mg by mouth daily.     valACYclovir  (VALTREX ) 500 MG tablet TAKE 1 TABLET(500 MG) BY MOUTH DAILY 90 tablet 0   VITAMIN  D PO Take 2 tablets by mouth daily.     No current facility-administered medications on file prior to visit.    Allergies  Allergen Reactions   Codeine Nausea And Vomiting      PE Today's Vitals   07/07/23 1146  BP: 112/60  Pulse: 66  SpO2: 98%   There is no height or weight on file to calculate BMI.  Physical Exam Vitals and nursing note reviewed. Exam conducted with a chaperone present.  Constitutional:      Appearance: Normal appearance.  Genitourinary:       Comments: Acetowhite change 7 oclock Neurological:     Mental Status: She is alert.  Psychiatric:        Mood and Affect: Mood normal.        Thought Content: Thought content normal.        Judgment: Judgment normal.      Colposcopy Procedure Consented for procedure.  Time out performed. Speculum placed in vagina.  Acetic acid 3% was applied to cervix.  Satisfactory colposcopy. TZ was completely seen. Biopsies taken Yes.   Location(s) - [facility]  Findings: CIN 1 vs scarring from previous LEEP ECC was  performed. yes Specimens to pathology separately.  Monsel's applied to biopsy areas.   Good hemostasis.  Minimal EBL. No complications.  Tolerated well.   Dr Andrena Ke and Ellis Guys, CMA present for procedure   Assessment and Plan:        1. Low grade squamous intraepithelial lesion (LGSIL) on Papanicolaou smear of vagina with positive (HPV) DNA test (Primary) - Surgical pathology( [place]/ [facility]) - Surgical pathology( [place]/ [facility])    Abnormal PAP results reviewed. ASCCP guidelines reviewed. Consents signed. Satisfactory colposcopy, ECC and biopsy performed. Aftercare instructions provided.  Will call with results. I also recommend completion of Gardasil vaccine up to age 45yo, avoidance of smoking, and use of condoms with new sexual partners to prevent progression of disease.   Vernica Wachtel B, NP

## 2023-07-08 ENCOUNTER — Other Ambulatory Visit: Payer: Self-pay | Admitting: Internal Medicine

## 2023-07-08 DIAGNOSIS — R0683 Snoring: Secondary | ICD-10-CM

## 2023-07-08 DIAGNOSIS — G4759 Other parasomnia: Secondary | ICD-10-CM

## 2023-07-10 ENCOUNTER — Other Ambulatory Visit: Payer: Self-pay | Admitting: Internal Medicine

## 2023-07-10 DIAGNOSIS — A6004 Herpesviral vulvovaginitis: Secondary | ICD-10-CM

## 2023-07-11 DIAGNOSIS — F411 Generalized anxiety disorder: Secondary | ICD-10-CM | POA: Diagnosis not present

## 2023-07-11 DIAGNOSIS — F319 Bipolar disorder, unspecified: Secondary | ICD-10-CM | POA: Diagnosis not present

## 2023-07-12 LAB — SURGICAL PATHOLOGY

## 2023-07-19 ENCOUNTER — Other Ambulatory Visit: Payer: Self-pay

## 2023-07-19 DIAGNOSIS — F411 Generalized anxiety disorder: Secondary | ICD-10-CM | POA: Diagnosis not present

## 2023-07-19 DIAGNOSIS — F319 Bipolar disorder, unspecified: Secondary | ICD-10-CM | POA: Diagnosis not present

## 2023-07-25 DIAGNOSIS — F319 Bipolar disorder, unspecified: Secondary | ICD-10-CM | POA: Diagnosis not present

## 2023-07-25 DIAGNOSIS — F411 Generalized anxiety disorder: Secondary | ICD-10-CM | POA: Diagnosis not present

## 2023-08-04 ENCOUNTER — Other Ambulatory Visit: Payer: Self-pay | Admitting: Internal Medicine

## 2023-08-04 ENCOUNTER — Ambulatory Visit (INDEPENDENT_AMBULATORY_CARE_PROVIDER_SITE_OTHER): Admitting: Pulmonary Disease

## 2023-08-04 ENCOUNTER — Encounter: Payer: Self-pay | Admitting: Pulmonary Disease

## 2023-08-04 VITALS — BP 115/65 | HR 65 | Ht 63.0 in | Wt 142.6 lb

## 2023-08-04 DIAGNOSIS — I10 Essential (primary) hypertension: Secondary | ICD-10-CM

## 2023-08-04 DIAGNOSIS — R0683 Snoring: Secondary | ICD-10-CM

## 2023-08-04 DIAGNOSIS — Z87891 Personal history of nicotine dependence: Secondary | ICD-10-CM

## 2023-08-04 DIAGNOSIS — G478 Other sleep disorders: Secondary | ICD-10-CM

## 2023-08-04 DIAGNOSIS — E118 Type 2 diabetes mellitus with unspecified complications: Secondary | ICD-10-CM

## 2023-08-04 NOTE — Progress Notes (Signed)
 Jessica Guzman    [id number]    10/07/1960  Primary Care Physician:Jones, Randa Burton, MD  Referring Physician: Arcadio Knuckles, MD [address] [place],  [place] [address]  Chief complaint:   Patient with snoring, nonrestorative sleep  HPI:  Longstanding history of snoring, nonrestorative sleep Has been told by multiple people about significant snoring  She is not rested when she wakes up in the morning on most days  Had a sleep study done about 2 years ago that was mildly positive for sleep apnea, was not started on CPAP therapy  Comorbidities of bipolar disorder, PTSD, anxiety, depression - On multiple medications that affect her sleep  Usually goes to bed between 10 and 11 Most of the time able to fall asleep between 15 and 30 minutes sometimes longer 2 or more awakenings Has lost about 40 pounds  Exercises regularly Stays very active Has a history of hypertension, diabetes, hypercholesterolemia  Quit smoking about 3 years ago, was an occasional smoker was never heavy smoker  She is not very sleepy during the day but does not feel rested when she wakes up in the morning on most days Outpatient Encounter Medications as of 08/04/2023  Medication Sig   aspirin 81 MG chewable tablet Chew by mouth daily.   atorvastatin  (LIPITOR) 80 MG tablet Take 1 tablet (80 mg total) by mouth at bedtime.   b complex vitamins capsule Take 1 capsule by mouth daily.   buPROPion (WELLBUTRIN XL) 150 MG 24 hr tablet Take 150 mg by mouth daily.   busPIRone (BUSPAR) 10 MG tablet Take 20 mg by mouth 2 (two) times daily.   carbamazepine  (CARBATROL ) 300 MG 12 hr capsule Take 200 mg by mouth at bedtime.   Continuous Glucose Receiver (FREESTYLE LIBRE 2 READER) DEVI USE AS DIRECTED.   Continuous Glucose Sensor (FREESTYLE LIBRE 2 SENSOR) MISC APPLY EVERY 14 DAYS.   gabapentin (NEURONTIN) 100 MG capsule Take 100-200 mg by mouth daily.   Glucagon  (GVOKE HYPOPEN  2-PACK) 1 MG/0.2ML SOAJ  Inject 1 Act into the skin daily as needed.   levothyroxine  (SYNTHROID ) 25 MCG tablet TAKE 1 TABLET(25 MCG) BY MOUTH EVERY MORNING   metFORMIN  (GLUCOPHAGE -XR) 500 MG 24 hr tablet TAKE 1 TABLET(500 MG) BY MOUTH DAILY WITH BREAKFAST   nitroGLYCERIN  (NITROSTAT ) 0.4 MG SL tablet Place 1 tablet (0.4 mg total) under the tongue every 5 (five) minutes as needed for chest pain.   olmesartan  (BENICAR ) 20 MG tablet Take 1 tablet (20 mg total) by mouth daily.   QUEtiapine  (SEROQUEL ) 300 MG tablet Take 300 mg by mouth daily.   valACYclovir  (VALTREX ) 500 MG tablet TAKE 1 TABLET(500 MG) BY MOUTH DAILY   VITAMIN D  PO Take 2 tablets by mouth daily.   No facility-administered encounter medications on file as of 08/04/2023.    Allergies as of 08/04/2023 - Review Complete 07/07/2023  Allergen Reaction Noted   Codeine Nausea And Vomiting 06/16/2016    Past Medical History:  Diagnosis Date   Bipolar 1 disorder (HCC)    Diabetes mellitus without complication (HCC)    Hyperlipidemia    Hypertension    PTSD (post-traumatic stress disorder)     Past Surgical History:  Procedure Laterality Date   PCOS     TUBAL LIGATION      Family History  Problem Relation Age of Onset   Alzheimer's disease Mother    Diabetes Sister    High Cholesterol Sister    High  blood pressure Sister    Bipolar disorder Brother    Stroke Maternal Grandfather    Diabetes Maternal Grandfather    Pancreatic cancer Paternal Grandfather     Social History   Socioeconomic History   Marital status: Divorced    Spouse name: Not on file   Number of children: Not on file   Years of education: Not on file   Highest education level: Bachelor's degree (e.g., BA, AB, BS)  Occupational History   Occupation: SSDI/ RETIRED  Tobacco Use   Smoking status: Former    Current packs/day: 0.00    Types: Cigarettes    Quit date: 02/01/2019    Years since quitting: 4.5   Smokeless tobacco: Never  Vaping Use   Vaping status: Never Used   Substance and Sexual Activity   Alcohol  use: Not Currently    Comment: rarely   Drug use: Never   Sexual activity: Yes  Other Topics Concern   Not on file  Social History Narrative   Lives alone   Right handed   Caffeine: 2-4 cups of coffee   Social Drivers of Corporate investment banker Strain: Low Risk  (04/11/2023)   Overall Financial Resource Strain (CARDIA)    Difficulty of Paying Living Expenses: Not hard at all  Food Insecurity: No Food Insecurity (04/11/2023)   Hunger Vital Sign    Worried About Running Out of Food in the Last Year: Never true    Ran Out of Food in the Last Year: Never true  Transportation Needs: No Transportation Needs (04/11/2023)   PRAPARE - Administrator, Civil Service (Medical): No    Lack of Transportation (Non-Medical): No  Physical Activity: Sufficiently Active (04/11/2023)   Exercise Vital Sign    Days of Exercise per Week: 6 days    Minutes of Exercise per Session: 60 min  Stress: Stress Concern Present (04/11/2023)   [employer] of Occupational Health - Occupational Stress Questionnaire    Feeling of Stress : To some extent  Social Connections: Moderately Integrated (04/11/2023)   Social Connection and Isolation Panel [NHANES]    Frequency of Communication with Friends and Family: More than three times a week    Frequency of Social Gatherings with Friends and Family: Three times a week    Attends Religious Services: More than 4 times per year    Active Member of Clubs or Organizations: Yes    Attends Banker Meetings: More than 4 times per year    Marital Status: Divorced  Intimate Partner Violence: Patient Unable To Answer (04/11/2023)   Humiliation, Afraid, Rape, and Kick questionnaire    Fear of Current or Ex-Partner: Patient unable to answer    Emotionally Abused: Patient unable to answer    Physically Abused: Patient unable to answer    Sexually Abused: Patient unable to answer    Review of Systems   Constitutional:  Positive for fatigue.  Respiratory:  Positive for apnea.   Psychiatric/Behavioral:  Positive for sleep disturbance.     There were no vitals filed for this visit.   Physical Exam Constitutional:      Appearance: Normal appearance.  HENT:     Head: Normocephalic.     Mouth/Throat:     Mouth: Mucous membranes are moist.  Eyes:     General: No scleral icterus. Cardiovascular:     Rate and Rhythm: Normal rate and regular rhythm.     Heart sounds: No murmur heard.    No  friction rub.  Pulmonary:     Effort: No respiratory distress.     Breath sounds: No stridor. No wheezing or rhonchi.  Musculoskeletal:     Cervical back: No rigidity or tenderness.  Neurological:     Mental Status: She is alert.  Psychiatric:        Mood and Affect: Mood normal.    Epworth Sleepiness Scale of 0 Data Reviewed: Sleep study from January 2023 was reviewed showing AHI of 5  Assessment:  Snoring  Nonrestrictive sleep  Mild obstructive sleep apnea history  History of mood disorder, bipolar disorder  Insomnia related to medical conditions    Plan/Recommendations: Will schedule the patient for an in lab sleep study  Behavioral modifications that may help reduce snoring and mild sleep apnea will include avoiding supine sleep  Encouraged to maintain a weight and stay active by exercising regularly  Optimize sleep hygiene as able  Continue medications to control mood disorder  Tentative follow-up in about 3 months  Options of treatment for sleep disordered breathing discussed including an oral device, CPAP therapy-she is not sure she will be able to tolerate CPAP therapy but willing to give it a chance if found to have significant sleep apnea  Encouraged to call with significant concerns   Myer Artis MD [facility] 08/04/2023, 8:34 AM  CC: Arcadio Knuckles, MD

## 2023-08-04 NOTE — Patient Instructions (Addendum)
 I will schedule you for an in lab sleep study to assess the degree of sleep apnea  Your last sleep study in January 2023 was only very mildly positive with an event rate of 5 an hour  Continue to optimize other treatments  Continue to stay active  Make attempts to ensure you are sleeping mostly on your side compared to your back  Controlling nasal stuffiness and congestion may help snoring  The App you can try is called snore lab-it records snoring  I will see you back in about 3 months  Options of treatment for sleep apnea as we discussed would include CPAP therapy, an oral device fashioned by a dentist  Call us  with significant concerns

## 2023-08-08 DIAGNOSIS — F411 Generalized anxiety disorder: Secondary | ICD-10-CM | POA: Diagnosis not present

## 2023-08-08 DIAGNOSIS — F319 Bipolar disorder, unspecified: Secondary | ICD-10-CM | POA: Diagnosis not present

## 2023-08-09 ENCOUNTER — Other Ambulatory Visit: Payer: Self-pay | Admitting: Internal Medicine

## 2023-08-09 DIAGNOSIS — E119 Type 2 diabetes mellitus without complications: Secondary | ICD-10-CM

## 2023-08-10 ENCOUNTER — Encounter: Payer: Self-pay | Admitting: Internal Medicine

## 2023-08-11 ENCOUNTER — Other Ambulatory Visit: Payer: Self-pay

## 2023-08-11 MED ORDER — METFORMIN HCL ER 500 MG PO TB24: 500.0000 mg | ORAL_TABLET | Freq: Every day | ORAL | 1 refills | Status: DC

## 2023-08-22 DIAGNOSIS — F319 Bipolar disorder, unspecified: Secondary | ICD-10-CM | POA: Diagnosis not present

## 2023-08-22 DIAGNOSIS — F411 Generalized anxiety disorder: Secondary | ICD-10-CM | POA: Diagnosis not present

## 2023-09-06 DIAGNOSIS — F411 Generalized anxiety disorder: Secondary | ICD-10-CM | POA: Diagnosis not present

## 2023-09-06 DIAGNOSIS — F319 Bipolar disorder, unspecified: Secondary | ICD-10-CM | POA: Diagnosis not present

## 2023-09-15 NOTE — Progress Notes (Addendum)
 Cardiology Office Note:  .   Date:  09/19/2023  ID:  Jessica Guzman, DOB [birth year], MRN [id number] PCP: Joshua Debby CROME, MD  [facility] Providers Cardiologist:  Lurena MARLA Red, MD {  History of Present Illness: .   Jessica Guzman is a 63 y.o. female with history of  coronary artery calcifications on coronary CTA 08/2022, diabetes, hypertension, hyperlipidemia, suspected OSA, PTSD     Coronary artery calcifications Referred to cardiology for elevated CAC  Echocardiogram 08/2021 with preserved biventricular function with no significant valvular disease Coronary CTA 08/2022 CAC score of 226.  Negative FFR.  Social history   previous smoker     Patient has history of nonobstructive CAD noted on coronary CTA with negative FFR.  Has not had any symptoms for this.  Last seen January 2025 with no complaints.She had some issues with hypotension so her olmesartan  20 mg was stopped.  Today patient presents for 38-month follow-up and reports that she occasionally has been having issues of just feeling fatigued and has noticed that they have been correlated with low blood pressure readings.  She reports that she has not been as hydrated as she should and is very active and has been spending a lot of time outdoors playing a [place] sports game 3+ times per week and going to the pool.  Reports no falls or episodes of syncope.  Reports chronic vertigo.  Otherwise no other complaints no shortness of breath or chest pain.    ROS: Denies: Chest pain, shortness of breath, orthopnea, peripheral edema, palpitations, decreased exercise intolerance, fatigue, lightheadedness.   Studies Reviewed: SABRA    EKG Interpretation Date/Time:  Monday September 19 2023 10:40:04 EDT Ventricular Rate:  69 PR Interval:  132 QRS Duration:  84 QT Interval:  408 QTC Calculation: 437 R Axis:   63  Text Interpretation: Normal sinus rhythm Normal ECG When compared with ECG of 22-Mar-2023 10:10, No significant  change was found Confirmed by Darryle Currier [phone]) on 09/19/2023 10:50:07 AM    Risk Assessment/Calculations:             Physical Exam:   VS:  BP (!) 92/57   Pulse 69   Ht 5' 3 (1.6 m)   Wt 142 lb (64.4 kg)   SpO2 96%   BMI 25.15 kg/m    Wt Readings from Last 3 Encounters:  09/19/23 142 lb (64.4 kg)  08/04/23 142 lb 9.6 oz (64.7 kg)  06/09/23 139 lb 6.4 oz (63.2 kg)    GEN: Well nourished, well developed in no acute distress NECK: No JVD; No carotid bruits CARDIAC: RRR, 2 out of 6 murmur right sternal border RESPIRATORY:  Clear to auscultation without rales, wheezing or rhonchi  ABDOMEN: Soft, non-tender, non-distended EXTREMITIES:  No edema; No deformity   ASSESSMENT AND PLAN: .    Coronary artery calcifications Hyperlipidemia Coronary CTA 08/2022 CAC score of 226 and negative FFR.  Stable disease, no chest pain or obvious anginal equivalents Continue with aspirin and atorvastatin  80 mg. LDL well-controlled and 69 November 2024. Repeat LDL next appt.  Suspected OSA Followed by pulmonology, has pending sleep study.  Hypertension More recently has been having issues with hypotension.  Systolics today in the 90s.  Corresponding with feeling whooped.  Suspect that she is dehydrated as well and has been spending a lot of times outdoors during a lot of physical activity Stopping her olmesartan  20 mg for the time being.  Get blood pressure twice a day every day  for the next week.  She will send a MyChart message to me with this report. Encourage better p.o. hydration  Diabetes Well-controlled A1c 6.1%  Aortic sclerosis Echocardiogram 08/2021 noting this.  Reports that she has had this for years. Still sounds mild.  Will follow this clinically.  Could consider repeating echocardiogram around 2026  Hypothyroidism Normal TSH 2 months ago.  She is on Synthroid  for this.    Dispo: 6 months follow-up to establish care with new cardiologist.  Previously Dr. Alvan.  After that  probably can be seen on an annual basis.  Signed, Thom LITTIE Sluder, PA-C

## 2023-09-19 ENCOUNTER — Encounter: Payer: Self-pay | Admitting: Physician Assistant

## 2023-09-19 ENCOUNTER — Ambulatory Visit: Attending: Physician Assistant | Admitting: Cardiology

## 2023-09-19 VITALS — BP 92/57 | HR 69 | Ht 63.0 in | Wt 142.0 lb

## 2023-09-19 DIAGNOSIS — E785 Hyperlipidemia, unspecified: Secondary | ICD-10-CM | POA: Diagnosis not present

## 2023-09-19 DIAGNOSIS — I2510 Atherosclerotic heart disease of native coronary artery without angina pectoris: Secondary | ICD-10-CM | POA: Diagnosis not present

## 2023-09-19 NOTE — Patient Instructions (Signed)
 Medication Instructions:  HOLD OLMESARTAN  UNTIL INSTRUCTED OTHERWISE *If you need a refill on your cardiac medications before your next appointment, please call your pharmacy*  Lab Work: NO LABS If you have labs (blood work) drawn today and your tests are completely normal, you will receive your results only by: MyChart Message (if you have MyChart) OR A paper copy in the mail If you have any lab test that is abnormal or we need to change your treatment, we will call you to review the results.  Testing/Procedures: NO TESTING  Follow-Up: At [facility], you and your health needs are our priority.  As part of our continuing mission to provide you with exceptional heart care, our providers are all part of one team.  This team includes your primary Cardiologist (physician) and Advanced Practice Providers or APPs (Physician Assistants and Nurse Practitioners) who all work together to provide you with the care you need, when you need it.  Your next appointment:   6 month(s)  Provider:   Arun K Thukkani, MD   Other Instructions CHECK BLOOD PRESSURE DAILY 2 TIMES A DAY FOR 2 WEEKS AND SEND READINGS VIA [facility]

## 2023-09-20 DIAGNOSIS — F411 Generalized anxiety disorder: Secondary | ICD-10-CM | POA: Diagnosis not present

## 2023-09-20 DIAGNOSIS — F319 Bipolar disorder, unspecified: Secondary | ICD-10-CM | POA: Diagnosis not present

## 2023-09-30 ENCOUNTER — Ambulatory Visit: Admitting: Pulmonary Disease

## 2023-10-02 ENCOUNTER — Ambulatory Visit (HOSPITAL_BASED_OUTPATIENT_CLINIC_OR_DEPARTMENT_OTHER): Attending: Pulmonary Disease | Admitting: Pulmonary Disease

## 2023-10-02 DIAGNOSIS — R0683 Snoring: Secondary | ICD-10-CM | POA: Insufficient documentation

## 2023-10-02 DIAGNOSIS — G478 Other sleep disorders: Secondary | ICD-10-CM | POA: Insufficient documentation

## 2023-10-03 ENCOUNTER — Other Ambulatory Visit: Payer: Self-pay | Admitting: Internal Medicine

## 2023-10-03 DIAGNOSIS — E039 Hypothyroidism, unspecified: Secondary | ICD-10-CM

## 2023-10-04 DIAGNOSIS — F411 Generalized anxiety disorder: Secondary | ICD-10-CM | POA: Diagnosis not present

## 2023-10-04 DIAGNOSIS — F319 Bipolar disorder, unspecified: Secondary | ICD-10-CM | POA: Diagnosis not present

## 2023-10-12 ENCOUNTER — Telehealth: Payer: Self-pay | Admitting: Pulmonary Disease

## 2023-10-12 NOTE — Telephone Encounter (Signed)
 Call patient  Sleep study result  Date of study: 10/02/2023  Impression: Negative study for significant sleep disordered breathing with an AHI of 2.6, O2 nadir of 87%  sleep quality was good   Recommendation:  Clinical follow-up of symptoms  Optimize behavioral measures that may promote optimal sleep quality  Avoid medications, stimulants, excessive alcohol  use that may disrupt sleep quality  Follow-up as previously scheduled

## 2023-10-12 NOTE — Procedures (Signed)
 [facility] [address] [place], [place] [address] Tel: [phone]   Fax: [phone]  Polysomnography Interpretation  Patient Name:  Jessica Guzman, Jessica Guzman Date:  10/02/2023 Referring Physician:  Neda Jennet FELIX  MD %%startinterp%% Indications for Polysomnography The patient is a 63 year-old Female who is 5' 3 and weighs 140.0 lbs. Her BMI equals 24.8.  A full night polysomnogram was performed to evaluate for -.  Medication  Seroquel   Atorvasttin  Buspar  Carbamazapine   Polysomnogram Data A full night polysomnogram recorded the standard physiologic parameters including EEG, EOG, EMG, EKG, nasal and oral airflow.  Respiratory parameters of chest and abdominal movements were recorded with Respiratory Inductance Plethysmography belts.  Oxygen saturation was recorded by pulse oximetry.   Sleep Architecture The total recording time of the polysomnogram was 399.9 minutes.  The total sleep time was 351.5 minutes.  The patient spent 0.9% of total sleep time in Stage N1, 67.0% in Stage N2, 15.6% in Stages N3, and 16.5% in REM.  Sleep latency was 6.2 minutes.  REM latency was 131.0 minutes.  Sleep Efficiency was 87.9%.  Wake after Sleep Onset time was 42.0 minutes.  Respiratory Events The polysomnogram revealed a presence of 1 obstructive, 2 central, and - mixed apneas resulting in an Apnea index of 0.7 events per hour.  There were 11 hypopneas (>=3% desaturation and/or arousal) resulting in an Apnea\Hypopnea Index (AHI >=3% desaturation and/or arousal) of 2.6 events per hour.  There were 5 hypopneas (>=4% desaturation) resulting in an Apnea\Hypopnea Index (AHI >=4% desaturation) of 1.5 events per hour.  There were - Respiratory Effort Related Arousals resulting in a RERA index of - events per hour. The Respiratory Disturbance Index is 2.6 events per hour.  The snore index was 0.3 events per hour.  Mean oxygen saturation was 93.0%.  The lowest oxygen  saturation during sleep was 87.0%.  Time spent <=88% oxygen saturation was 0.2 minutes (0.1%).  Limb Activity There were 1 total limb movements recorded, of this total, - were classified as PLMs.  PLM index was - per hour and PLM associated with Arousals index was - per hour.  Cardiac Summary The average pulse rate was 57.4 bpm.  The minimum pulse rate was 50.0 bpm while the maximum pulse rate was 77.0 bpm.  Cardiac rhythm was normal/abnormal.  Comments:   Diagnosis:  Negative study for significant sleep disordered breathing with AHI of 2.6, oxygen nadir of 87% Sleep efficiency was good, good sleep architecture No significant periodic limb movement Cardiac rhythm was sinus  Recommendations: Clinical follow-up of symptoms Optimize sleep hygiene Behavioral modifications to promote optimal sleep quality Avoid medications, significant alcohol  use or stimulants that may disrupt sleep quality  Follow-up as previously scheduled  This study was personally reviewed and electronically signed by: Neda Irineo FELIX MD Accredited Board Certified in Sleep Medicine Date/Time:  10/12/2023   %%endinterp%%   Diagnostic PSG Report  Patient Name: Jessica Guzman, Jessica Guzman Date: 10/02/2023  Date of Birth: 19-May-1960 Study Type: Diagnostic  Age: 32 year MRN #: [id number]  Sex: Female Interpreting Physician: NEDA JENNET, [id number]  Height: 5' 3 Referring Physician: Neda Jennet FELIX  MD  Weight: 140.0 lbs Recording Tech: Jamee McConnico RPSGT RST  BMI: 24.8 Scoring Tech: Jamee Gain RPSGT RST  ESS: 1 Neck Size: 14   Study Overview  Lights Off: 10:06:25 PM  Count Index  Lights On: 04:46:17 AM Awakenings: 8 1.4  Time in Bed: 399.9 min. Arousals: 4 0.7  Total Sleep  Time: 351.5 min. AHI (>=3% Desat and/or Ar.): 15 2.6   Sleep Efficiency: 87.9% AHI (>=4% Desat): 9 1.5   Sleep Latency: 6.2 min. Limb Movements: 1 0.2  Wake After Sleep Onset: 42.0 min. Snore: 2 0.3  REM Latency from Sleep  Onset: 131.0 min. Desaturations: 16 2.7     Minimum SpO2 TST: 87.0%    Sleep Architecture  % of Time in Bed Stages Time (mins) % Sleep Time  Wake 49.0   Stage N1 3.0 0.9%  Stage N2 235.5 67.0%  Stage N3 55.0 15.6%  REM 58.0 16.5%   Arousal Summary   NREM REM Sleep Index  Respiratory Arousals - - - -  PLM Arousals - - - -  Isolated Limb Movement Arousals 1 - 1 0.2  Snore Arousals - - - -  Spontaneous Arousals 2 1 3  0.5  Total 3 1 4  0.7   Limb Movement Summary   Count Index  Isolated Limb Movements 1 0.2  Periodic Limb Movements (PLMs) - -  Total Limb Movements 1 0.2    Respiratory Summary   By Sleep Stage By Body Position Total   NREM REM Supine Non-Supine   Time (min) 293.5 58.0 278.0 73.5 351.5         Obstructive Apnea - 1 1 - 1  Mixed Apnea - - - - -  Central Apnea 1 1 1 1 2   Total Apneas 1 3 3 1 4   Total Apnea Index 0.2 3.1 0.6 0.8 0.7         Hypopneas (>=3% Desat and/or Ar.) 2 9 10 1 11   AHI (>=3% Desat and/or Ar.) 0.6 12.4 2.8 1.6 2.6         Hypopneas (>=4% Desat) - 5 5 - 5  AHI (>=4% Desat) 0.2 8.3 1.7 0.8 1.5          RERAs - - - - -  RERA Index - - - - -         RDI 0.6 12.4 2.8 1.6 2.6    Respiratory Event Type Index  Central Apneas 0.3  Obstructive Apneas 0.2  Mixed Apneas -  Central Hypopneas -  Obstructive Hypopneas 0.3  Central Apnea + Hypopnea (CAHI) 0.3  Obstructive Apnea + Hypopnea (OAHI) 2.2   Respiratory Event Durations   Apnea Hypopnea   NREM REM NREM REM  Average (seconds) 3.0 15.2 21.8 29.3  Maximum (seconds) 3.0 29.1 22.0 50.7    Oxygen Saturation Summary   Wake NREM REM TST TIB  Average SpO2 (%) 92.9% 92.9% 93.5% 93.0% 93.0%  Minimum SpO2 (%) 91.0% 91.0% 87.0% 87.0% 87.0%  Maximum SpO2 (%) 97.0% 95.0% 96.0% 96.0% 97.0%   Oxygen Saturation Distribution  Range (%) Time in range (min) Time in range (%)  90.0 - 100.0 397.7 99.6%  80.0 - 90.0 1.6 0.4%  70.0 - 80.0 - -  60.0 - 70.0 - -  50.0 - 60.0 - -  0.0 -  50.0 - -  Time Spent <=88% SpO2  Range (%) Time in range (min) Time in range (%)  0.0 - 88.0 0.2 0.1%      Count Index  Desaturations 16 2.7    Cardiac Summary   Wake NREM REM Sleep Total  Average Pulse Rate (BPM) 60.4 56.8 57.9 57.0 57.4  Minimum Pulse Rate (BPM) 53.0 51.0 50.0 50.0 50.0  Maximum Pulse Rate (BPM) 77.0 66.0 64.0 66.0 77.0   Pulse Rate Distribution:  Range (bpm) Time in range (min) Time  in range (%)  0.0 - 40.0 - -  40.0 - 60.0 374.8 93.7%  60.0 - 80.0 25.1 6.3%  80.0 - 100.0 - -  100.0 - 120.0 - -  120.0 - 140.0 - -  140.0 - 200.0 - -      Hypnograms                      Technologist Comments  Pt presented to the lab for a diagnostic study.  She was not very sleepy but she took her medicine at 2130 and was very sleepy when I got her in bed.  Pt slept supine and lateral. She prefers to sleep on her back.  She sleeps with her mouth closed most of the time.  I did not observe any cardiac abnormalities nor did I see PLMS with or without arousals during the study.  She had almost no events except in Rem. But even in Rem , they were not many events.

## 2023-10-13 NOTE — Telephone Encounter (Signed)
 Relayed patient results,follow up appointment scheduled

## 2023-10-24 ENCOUNTER — Other Ambulatory Visit: Payer: Self-pay | Admitting: Internal Medicine

## 2023-10-24 DIAGNOSIS — E785 Hyperlipidemia, unspecified: Secondary | ICD-10-CM

## 2023-10-26 ENCOUNTER — Other Ambulatory Visit: Payer: Self-pay

## 2023-10-26 DIAGNOSIS — E785 Hyperlipidemia, unspecified: Secondary | ICD-10-CM

## 2023-10-26 MED ORDER — ATORVASTATIN CALCIUM 80 MG PO TABS: 80.0000 mg | ORAL_TABLET | Freq: Every day | ORAL | 3 refills | Status: DC

## 2023-10-27 DIAGNOSIS — F411 Generalized anxiety disorder: Secondary | ICD-10-CM | POA: Diagnosis not present

## 2023-10-27 DIAGNOSIS — F319 Bipolar disorder, unspecified: Secondary | ICD-10-CM | POA: Diagnosis not present

## 2023-10-31 DIAGNOSIS — F411 Generalized anxiety disorder: Secondary | ICD-10-CM | POA: Diagnosis not present

## 2023-10-31 DIAGNOSIS — F319 Bipolar disorder, unspecified: Secondary | ICD-10-CM | POA: Diagnosis not present

## 2023-11-14 ENCOUNTER — Ambulatory Visit: Admitting: Pulmonary Disease

## 2023-11-14 VITALS — BP 103/62 | HR 72

## 2023-11-14 DIAGNOSIS — Z8659 Personal history of other mental and behavioral disorders: Secondary | ICD-10-CM | POA: Diagnosis not present

## 2023-11-14 DIAGNOSIS — G4700 Insomnia, unspecified: Secondary | ICD-10-CM | POA: Diagnosis not present

## 2023-11-14 DIAGNOSIS — Z87891 Personal history of nicotine dependence: Secondary | ICD-10-CM | POA: Diagnosis not present

## 2023-11-14 DIAGNOSIS — R0683 Snoring: Secondary | ICD-10-CM

## 2023-11-14 NOTE — Patient Instructions (Signed)
 Your sleep study was negative for significant sleep apnea  Since you are feeling better at the present time, we do not need to make any changes  I will see you back in a year tentatively, the appointment can be canceled if you are feeling better and not having any issues  Encourage behaviors that promote good sleep  Optimize sleep hygiene as able

## 2023-11-14 NOTE — Progress Notes (Signed)
 Jessica Guzman    [id number]    [birth year]  Primary Care Physician:Jones, Debby CROME, MD  Referring Physician: Joshua Debby CROME, MD [address] [place],  [place] [address]  Chief complaint:   Patient with snoring, nonrestorative sleep In for follow-up today  HPI:  Recently had sleep study done sleep study did reveal a negative study for significant sleep apnea  She has been feeling relatively well with no ongoing symptoms Felt symptoms at the time may have been related to allergies  Otherwise sleep habits have improved Waking up feeling rested  Had had a previous study showing mild obstructive sleep apnea, was not started on CPAP at the time  Comorbidities of bipolar disorder, PTSD, anxiety, depression - On multiple medications that affect her sleep  Usually goes to bed between 10 and 11 Most of the time able to fall asleep between 15 and 30 minutes sometimes longer 2 or more awakenings Has lost about 40 pounds, weight loss is maintained  Continues to exercise regularly, tries to stay very active History of hypertension, diabetes, hypercholesterolemia  Quit smoking about 3 years ago, was an occasional smoker was never heavy smoker  No sleepy during the day, feels rested whenever she wakes up in the morning Outpatient Encounter Medications as of 11/14/2023  Medication Sig   aspirin 81 MG chewable tablet Chew by mouth daily.   atorvastatin  (LIPITOR) 80 MG tablet Take 1 tablet (80 mg total) by mouth at bedtime.   b complex vitamins capsule Take 1 capsule by mouth daily.   buPROPion (WELLBUTRIN XL) 150 MG 24 hr tablet Take 150 mg by mouth daily.   busPIRone (BUSPAR) 10 MG tablet Take 20 mg by mouth 2 (two) times daily.   carbamazepine  (CARBATROL ) 300 MG 12 hr capsule Take 200 mg by mouth at bedtime.   Continuous Glucose Receiver (FREESTYLE LIBRE 2 READER) DEVI USE AS DIRECTED.   Continuous Glucose Sensor (FREESTYLE LIBRE 2 SENSOR) MISC APPLY EVERY 14 DAYS    gabapentin (NEURONTIN) 100 MG capsule Take 100-200 mg by mouth daily.   Glucagon  (GVOKE HYPOPEN  2-PACK) 1 MG/0.2ML SOAJ Inject 1 Act into the skin daily as needed.   levothyroxine  (SYNTHROID ) 25 MCG tablet TAKE 1 TABLET(25 MCG) BY MOUTH EVERY MORNING   metFORMIN  (GLUCOPHAGE -XR) 500 MG 24 hr tablet Take 1 tablet (500 mg total) by mouth daily with breakfast.   nitroGLYCERIN  (NITROSTAT ) 0.4 MG SL tablet Place 1 tablet (0.4 mg total) under the tongue every 5 (five) minutes as needed for chest pain.   olmesartan  (BENICAR ) 20 MG tablet Take 1 tablet (20 mg total) by mouth daily.   QUEtiapine  (SEROQUEL ) 300 MG tablet Take 300 mg by mouth daily.   valACYclovir  (VALTREX ) 500 MG tablet TAKE 1 TABLET(500 MG) BY MOUTH DAILY   VITAMIN D  PO Take 2 tablets by mouth daily.   No facility-administered encounter medications on file as of 11/14/2023.    Allergies as of 11/14/2023 - Review Complete 11/14/2023  Allergen Reaction Noted   Codeine Nausea And Vomiting 06/16/2016    Past Medical History:  Diagnosis Date   Bipolar 1 disorder (HCC)    Diabetes mellitus without complication (HCC)    Hyperlipidemia    Hypertension    PTSD (post-traumatic stress disorder)     Past Surgical History:  Procedure Laterality Date   PCOS     TUBAL LIGATION      Family History  Problem Relation Age of Onset   Alzheimer's  disease Mother    Diabetes Sister    High Cholesterol Sister    High blood pressure Sister    Bipolar disorder Brother    Stroke Maternal Grandfather    Diabetes Maternal Grandfather    Pancreatic cancer Paternal Grandfather     Social History   Socioeconomic History   Marital status: Divorced    Spouse name: Not on file   Number of children: Not on file   Years of education: Not on file   Highest education level: Bachelor's degree (e.g., BA, AB, BS)  Occupational History   Occupation: SSDI/ RETIRED  Tobacco Use   Smoking status: Former    Current packs/day: 0.00    Types:  Cigarettes    Quit date: 02/01/2019    Years since quitting: 4.7   Smokeless tobacco: Never  Vaping Use   Vaping status: Never Used  Substance and Sexual Activity   Alcohol  use: Not Currently    Comment: rarely   Drug use: Never   Sexual activity: Yes  Other Topics Concern   Not on file  Social History Narrative   Lives alone   Right handed   Caffeine: 2-4 cups of coffee   Social Drivers of Corporate investment banker Strain: Low Risk  (04/11/2023)   Overall Financial Resource Strain (CARDIA)    Difficulty of Paying Living Expenses: Not hard at all  Food Insecurity: No Food Insecurity (04/11/2023)   Hunger Vital Sign    Worried About Running Out of Food in the Last Year: Never true    Ran Out of Food in the Last Year: Never true  Transportation Needs: No Transportation Needs (04/11/2023)   PRAPARE - Administrator, Civil Service (Medical): No    Lack of Transportation (Non-Medical): No  Physical Activity: Sufficiently Active (04/11/2023)   Exercise Vital Sign    Days of Exercise per Week: 6 days    Minutes of Exercise per Session: 60 min  Stress: Stress Concern Present (04/11/2023)   [employer] of Occupational Health - Occupational Stress Questionnaire    Feeling of Stress : To some extent  Social Connections: Moderately Integrated (04/11/2023)   Social Connection and Isolation Panel    Frequency of Communication with Friends and Family: More than three times a week    Frequency of Social Gatherings with Friends and Family: Three times a week    Attends Religious Services: More than 4 times per year    Active Member of Clubs or Organizations: Yes    Attends Banker Meetings: More than 4 times per year    Marital Status: Divorced  Intimate Partner Violence: Patient Unable To Answer (04/11/2023)   Humiliation, Afraid, Rape, and Kick questionnaire    Fear of Current or Ex-Partner: Patient unable to answer    Emotionally Abused: Patient unable to answer     Physically Abused: Patient unable to answer    Sexually Abused: Patient unable to answer    Review of Systems  Respiratory:  Positive for apnea.   Psychiatric/Behavioral:  Positive for sleep disturbance.     Vitals:   11/14/23 1036  BP: 103/62  Pulse: 72  SpO2: 97%     Physical Exam Constitutional:      Appearance: Normal appearance.  HENT:     Head: Normocephalic.     Mouth/Throat:     Mouth: Mucous membranes are moist.  Eyes:     General: No scleral icterus. Cardiovascular:     Rate and Rhythm:  Normal rate and regular rhythm.     Heart sounds: No murmur heard.    No friction rub.  Pulmonary:     Effort: No respiratory distress.     Breath sounds: No stridor. No wheezing or rhonchi.  Musculoskeletal:     Cervical back: No rigidity or tenderness.  Neurological:     Mental Status: She is alert.  Psychiatric:        Mood and Affect: Mood normal.    Epworth Sleepiness Scale of 0 Data Reviewed: Sleep study from January 2023 was reviewed showing AHI of 5  Most recent sleep study 10/02/2023 negative for significant sleep disordered breathing with AHI of 2.6, O2 nadir of 87% with good sleep quality  Assessment:  Snoring, nonrestorative sleep - Symptoms appear better  Past history of mild obstructive sleep apnea - Most recent sleep study was negative for significant sleep apnea  History of bipolar disorder, mood disorder - Stable  Insomnia related to medical conditions - Stable  Plan/Recommendations: Encourage behavioral modifications that promote adequate sleep  Avoid supine sleep, proper lateral sleep as able  Optimize sleep hygiene as able  Tentative follow-up a year from now Appointment may be canceled if not having any acute issues at the time  If concerns remain with snoring, referral to a dentist for an oral device may be considered    Jennet Epley MD [facility] 11/14/2023, 10:46 AM  CC: Joshua Debby CROME,  MD

## 2023-11-16 ENCOUNTER — Encounter: Payer: Self-pay | Admitting: Internal Medicine

## 2023-11-16 ENCOUNTER — Other Ambulatory Visit (HOSPITAL_COMMUNITY): Payer: Self-pay

## 2023-11-16 ENCOUNTER — Ambulatory Visit: Admitting: Internal Medicine

## 2023-11-16 VITALS — BP 136/82 | HR 61 | Temp 98.5°F | Resp 16 | Ht 63.0 in | Wt 145.0 lb

## 2023-11-16 DIAGNOSIS — E039 Hypothyroidism, unspecified: Secondary | ICD-10-CM

## 2023-11-16 DIAGNOSIS — Z23 Encounter for immunization: Secondary | ICD-10-CM | POA: Diagnosis not present

## 2023-11-16 DIAGNOSIS — E118 Type 2 diabetes mellitus with unspecified complications: Secondary | ICD-10-CM | POA: Diagnosis not present

## 2023-11-16 DIAGNOSIS — I10 Essential (primary) hypertension: Secondary | ICD-10-CM

## 2023-11-16 DIAGNOSIS — K7581 Nonalcoholic steatohepatitis (NASH): Secondary | ICD-10-CM

## 2023-11-16 DIAGNOSIS — E2839 Other primary ovarian failure: Secondary | ICD-10-CM | POA: Insufficient documentation

## 2023-11-16 DIAGNOSIS — E785 Hyperlipidemia, unspecified: Secondary | ICD-10-CM

## 2023-11-16 MED ORDER — OLMESARTAN MEDOXOMIL 5 MG PO TABS
5.0000 mg | ORAL_TABLET | Freq: Every day | ORAL | 1 refills | Status: AC
  Filled 2023-11-16: qty 90, 90d supply, fill #0
  Filled 2024-02-18: qty 90, 90d supply, fill #1

## 2023-11-16 NOTE — Progress Notes (Signed)
 "       Subjective:  Patient ID: Jessica Guzman, female    DOB: 01-Jan-1961  Age: 63 y.o. MRN: [id number]  CC: Hypertension, Diabetes, and Hypothyroidism   HPI Jessica Guzman presents for f/up ---  Discussed the use of AI scribe software for clinical note transcription with the patient, who gave verbal consent to proceed.  History of Present Illness Jessica Guzman is a 63 year old female who presents with allergy symptoms and concerns about medication side effects.  She experiences symptoms resembling a cold, such as itchy ears and dry eyes, which she attributes to allergies. Her ears feel clogged, and she frequently experiences ear popping. She takes generic Xyzal and occasionally uses a nasal spray, and also mentions using Claritin, which provides some relief. No coughing, wheezing, sore throat, fevers, chills, or night sweats. She is frustrated with her symptoms and their impact on her quality of life.  She is concerned about memory issues, describing difficulty 'finding words' and feeling paranoid about her memory. She attributes some of these concerns to the medications she takes for bipolar disorder.  She uses a [employer] 2 device to monitor her blood sugar levels and reports occasional low readings, though she notes that the device sometimes indicates lower levels than actual. She has not experienced any significant issues with her blood sugar.  No urinary symptoms, headaches, numbness, weakness, or tingling. Gastrointestinal symptoms are stable, with no reported issues.     Outpatient Medications Prior to Visit  Medication Sig Dispense Refill   aspirin 81 MG chewable tablet Chew by mouth daily.     atorvastatin  (LIPITOR) 80 MG tablet Take 1 tablet (80 mg total) by mouth at bedtime. 90 tablet 3   b complex vitamins capsule Take 1 capsule by mouth daily.     buPROPion  (WELLBUTRIN  XL) 150 MG 24 hr tablet Take 150 mg by mouth daily.     busPIRone   (BUSPAR ) 10 MG tablet Take 20 mg by mouth 2 (two) times daily.     carbamazepine  (CARBATROL ) 300 MG 12 hr capsule Take 200 mg by mouth at bedtime.     Continuous Glucose Receiver (FREESTYLE LIBRE 2 READER) DEVI USE AS DIRECTED. 2 each 5   Continuous Glucose Sensor (FREESTYLE LIBRE 2 SENSOR) MISC APPLY EVERY 14 DAYS 2 each 5   gabapentin  (NEURONTIN ) 100 MG capsule Take 100-200 mg by mouth daily.     Glucagon  (GVOKE HYPOPEN  2-PACK) 1 MG/0.2ML SOAJ Inject 1 Act into the skin daily as needed. 2 mL 5   metFORMIN  (GLUCOPHAGE -XR) 500 MG 24 hr tablet Take 1 tablet (500 mg total) by mouth daily with breakfast. 90 tablet 1   nitroGLYCERIN  (NITROSTAT ) 0.4 MG SL tablet Place 1 tablet (0.4 mg total) under the tongue every 5 (five) minutes as needed for chest pain. 50 tablet 0   QUEtiapine  (SEROQUEL ) 300 MG tablet Take 300 mg by mouth daily.     valACYclovir  (VALTREX ) 500 MG tablet TAKE 1 TABLET(500 MG) BY MOUTH DAILY 90 tablet 0   VITAMIN D  PO Take 2 tablets by mouth daily.     levothyroxine  (SYNTHROID ) 25 MCG tablet TAKE 1 TABLET(25 MCG) BY MOUTH EVERY MORNING 90 tablet 0   olmesartan  (BENICAR ) 20 MG tablet Take 1 tablet (20 mg total) by mouth daily. 90 tablet 1   No facility-administered medications prior to visit.    ROS Review of Systems  Objective:  BP 136/82 (BP Location: Left Arm, Patient Position: Sitting, Cuff Size: Normal)  Pulse 61   Temp 98.5 F (36.9 C) (Oral)   Resp 16   Ht 5' 3 (1.6 m)   Wt 145 lb (65.8 kg)   SpO2 98%   BMI 25.69 kg/m   BP Readings from Last 3 Encounters:  11/16/23 136/82  11/14/23 103/62  09/19/23 (!) 92/57    Wt Readings from Last 3 Encounters:  11/16/23 145 lb (65.8 kg)  10/02/23 140 lb (63.5 kg)  09/19/23 142 lb (64.4 kg)    Physical Exam Vitals reviewed.  Constitutional:      Appearance: Normal appearance.  HENT:     Right Ear: Hearing, tympanic membrane, ear canal and external ear normal.     Left Ear: Hearing, tympanic membrane, ear canal  and external ear normal.     Mouth/Throat:     Mouth: Mucous membranes are moist.  Eyes:     General: No scleral icterus.    Conjunctiva/sclera: Conjunctivae normal.  Cardiovascular:     Rate and Rhythm: Normal rate and regular rhythm.     Heart sounds: Murmur heard.     Systolic murmur is present with a grade of 2/6.     No diastolic murmur is present.     No friction rub. No gallop.  Pulmonary:     Effort: Pulmonary effort is normal.     Breath sounds: No stridor. No wheezing, rhonchi or rales.  Abdominal:     General: Abdomen is flat.     Palpations: There is no mass.     Tenderness: There is no abdominal tenderness. There is no guarding or rebound.     Hernia: No hernia is present.  Musculoskeletal:        General: Normal range of motion.     Cervical back: Neck supple.     Right lower leg: No edema.     Left lower leg: No edema.  Lymphadenopathy:     Cervical: No cervical adenopathy.  Skin:    General: Skin is warm and dry.  Neurological:     General: No focal deficit present.     Mental Status: She is alert. Mental status is at baseline.  Psychiatric:        Mood and Affect: Mood normal.        Behavior: Behavior normal.     Lab Results  Component Value Date   WBC 7.8 11/17/2023   HGB 13.5 11/17/2023   HCT 40.7 11/17/2023   PLT 263.0 11/17/2023   GLUCOSE 114 (H) 11/17/2023   CHOL 123 11/17/2023   TRIG 134.0 11/17/2023   HDL 59.10 11/17/2023   LDLCALC 37 11/17/2023   ALT 70 (H) 11/17/2023   AST 47 (H) 11/17/2023   NA 134 (L) 11/17/2023   K 4.0 11/17/2023   CL 99 11/17/2023   CREATININE 0.56 11/17/2023   BUN 13 11/17/2023   CO2 28 11/17/2023   TSH 0.72 11/17/2023   INR 1.0 09/07/2022   HGBA1C 6.5 11/17/2023   MICROALBUR <0.7 06/09/2023    Fibrosis 4 Score = 1.28 (Low risk)        Interpretation for patients with NAFLD          <1.30       -  F0-F1 (Low risk)          1.30-2.67 -  Indeterminate           >2.67      -  F3-F4 (High risk)      Validated for ages 66-65  Assessment & Plan:   Type II diabetes mellitus with complication (HCC)- Blood sugar is well controlled. -     HM Diabetes Foot Exam -     Hemoglobin A1c; Future -     CBC with Differential/Platelet; Future -     Basic metabolic panel with GFR; Future -     Olmesartan  Medoxomil; Take 1 tablet (5 mg total) by mouth daily.  Dispense: 90 tablet; Refill: 1  Dyslipidemia, goal LDL below 100- LDL goal achieved. Doing well on the statin  -     Lipid panel; Future -     Hepatic function panel; Future  Primary hypertension- BP is well controlled. -     TSH; Future -     CBC with Differential/Platelet; Future -     Basic metabolic panel with GFR; Future -     Olmesartan  Medoxomil; Take 1 tablet (5 mg total) by mouth daily.  Dispense: 90 tablet; Refill: 1  Acquired hypothyroidism- She is euthyroid. -     TSH; Future -     CBC with Differential/Platelet; Future -     Levothyroxine  Sodium; Take 1 tablet (25 mcg total) by mouth daily before breakfast.  Dispense: 90 tablet; Refill: 1  Estrogen deficiency -     DG Bone Density; Future  Need for immunization against influenza -     Flu vaccine trivalent PF, 6mos and older(Flulaval,Afluria,Fluarix,Fluzone)  Metabolic dysfunction-associated steatohepatitis (MASH)     Follow-up: Return in about 6 months (around 05/15/2024).  Debby Molt, MD "

## 2023-11-16 NOTE — Patient Instructions (Signed)

## 2023-11-17 ENCOUNTER — Other Ambulatory Visit (HOSPITAL_COMMUNITY): Payer: Self-pay

## 2023-11-17 ENCOUNTER — Ambulatory Visit: Payer: Self-pay | Admitting: Internal Medicine

## 2023-11-17 DIAGNOSIS — K7581 Nonalcoholic steatohepatitis (NASH): Secondary | ICD-10-CM | POA: Insufficient documentation

## 2023-11-17 DIAGNOSIS — Z23 Encounter for immunization: Secondary | ICD-10-CM | POA: Insufficient documentation

## 2023-11-17 LAB — CBC WITH DIFFERENTIAL/PLATELET
Basophils Absolute: 0 K/uL (ref 0.0–0.1)
Basophils Relative: 0.5 % (ref 0.0–3.0)
Eosinophils Absolute: 0.2 K/uL (ref 0.0–0.7)
Eosinophils Relative: 2.9 % (ref 0.0–5.0)
HCT: 40.7 % (ref 36.0–46.0)
Hemoglobin: 13.5 g/dL (ref 12.0–15.0)
Lymphocytes Relative: 31.1 % (ref 12.0–46.0)
Lymphs Abs: 2.4 K/uL (ref 0.7–4.0)
MCHC: 33.2 g/dL (ref 30.0–36.0)
MCV: 87.5 fl (ref 78.0–100.0)
Monocytes Absolute: 0.6 K/uL (ref 0.1–1.0)
Monocytes Relative: 7.7 % (ref 3.0–12.0)
Neutro Abs: 4.5 K/uL (ref 1.4–7.7)
Neutrophils Relative %: 57.8 % (ref 43.0–77.0)
Platelets: 263 K/uL (ref 150.0–400.0)
RBC: 4.65 Mil/uL (ref 3.87–5.11)
RDW: 13.7 % (ref 11.5–15.5)
WBC: 7.8 K/uL (ref 4.0–10.5)

## 2023-11-17 LAB — HEPATIC FUNCTION PANEL
ALT: 70 U/L — ABNORMAL HIGH (ref 0–35)
AST: 47 U/L — ABNORMAL HIGH (ref 0–37)
Albumin: 4.3 g/dL (ref 3.5–5.2)
Alkaline Phosphatase: 72 U/L (ref 39–117)
Bilirubin, Direct: 0.1 mg/dL (ref 0.0–0.3)
Total Bilirubin: 0.5 mg/dL (ref 0.2–1.2)
Total Protein: 6.7 g/dL (ref 6.0–8.3)

## 2023-11-17 LAB — LIPID PANEL
Cholesterol: 123 mg/dL (ref 0–200)
HDL: 59.1 mg/dL (ref >39.00)
LDL Cholesterol: 37 mg/dL (ref 0–99)
NonHDL: 64.25
Total CHOL/HDL Ratio: 2
Triglycerides: 134 mg/dL (ref 0.0–149.0)
VLDL: 26.8 mg/dL (ref 0.0–40.0)

## 2023-11-17 LAB — TSH: TSH: 0.72 u[IU]/mL (ref 0.35–5.50)

## 2023-11-17 LAB — BASIC METABOLIC PANEL WITH GFR
BUN: 13 mg/dL (ref 6–23)
CO2: 28 meq/L (ref 19–32)
Calcium: 9.7 mg/dL (ref 8.4–10.5)
Chloride: 99 meq/L (ref 96–112)
Creatinine, Ser: 0.56 mg/dL (ref 0.40–1.20)
GFR: 98.86 mL/min (ref >60.00)
Glucose, Bld: 114 mg/dL — ABNORMAL HIGH (ref 70–99)
Potassium: 4 meq/L (ref 3.5–5.1)
Sodium: 134 meq/L — ABNORMAL LOW (ref 135–145)

## 2023-11-17 LAB — HEMOGLOBIN A1C: Hgb A1c MFr Bld: 6.5 % (ref 4.6–6.5)

## 2023-11-17 MED ORDER — LEVOTHYROXINE SODIUM 25 MCG PO TABS
25.0000 ug | ORAL_TABLET | Freq: Every day | ORAL | 1 refills | Status: AC
  Filled 2023-11-17 – 2023-12-29 (×2): qty 90, 90d supply, fill #0

## 2023-11-18 ENCOUNTER — Other Ambulatory Visit (HOSPITAL_COMMUNITY): Payer: Self-pay

## 2023-11-18 ENCOUNTER — Encounter: Payer: Self-pay | Admitting: Internal Medicine

## 2023-11-18 DIAGNOSIS — E785 Hyperlipidemia, unspecified: Secondary | ICD-10-CM

## 2023-11-21 ENCOUNTER — Ambulatory Visit (INDEPENDENT_AMBULATORY_CARE_PROVIDER_SITE_OTHER)
Admission: RE | Admit: 2023-11-21 | Discharge: 2023-11-21 | Disposition: A | Discharge status: Home / Self Care | Source: Ambulatory Visit | Attending: Internal Medicine | Admitting: Internal Medicine

## 2023-11-21 ENCOUNTER — Other Ambulatory Visit (HOSPITAL_COMMUNITY): Payer: Self-pay

## 2023-11-21 DIAGNOSIS — E2839 Other primary ovarian failure: Secondary | ICD-10-CM | POA: Diagnosis not present

## 2023-11-21 MED ORDER — ATORVASTATIN CALCIUM 40 MG PO TABS
40.0000 mg | ORAL_TABLET | Freq: Every day | ORAL | 3 refills | Status: AC
  Filled 2023-11-21: qty 90, 90d supply, fill #0
  Filled 2024-02-18: qty 90, 90d supply, fill #1

## 2023-11-22 ENCOUNTER — Other Ambulatory Visit (HOSPITAL_COMMUNITY): Payer: Self-pay

## 2023-11-22 ENCOUNTER — Other Ambulatory Visit: Payer: Self-pay

## 2023-11-22 MED ORDER — BUPROPION HCL ER (XL) 150 MG PO TB24
150.0000 mg | ORAL_TABLET | Freq: Every morning | ORAL | 1 refills | Status: AC
  Filled 2023-11-22: qty 30, 30d supply, fill #0
  Filled 2023-12-29: qty 30, 30d supply, fill #1

## 2023-11-22 MED ORDER — BUSPIRONE HCL 10 MG PO TABS: 20.0000 mg | ORAL_TABLET | Freq: Two times a day (BID) | ORAL | 1 refills | Status: AC

## 2023-11-22 MED ORDER — BUSPIRONE HCL 10 MG PO TABS: 20.0000 mg | ORAL_TABLET | Freq: Two times a day (BID) | ORAL | 0 refills | Status: AC

## 2023-11-22 MED ORDER — ATORVASTATIN CALCIUM 80 MG PO TABS
80.0000 mg | ORAL_TABLET | Freq: Every day | ORAL | 3 refills | Status: AC
  Filled 2023-11-22: qty 90, 90d supply, fill #0

## 2023-11-22 MED ORDER — BUSPIRONE HCL 10 MG PO TABS
20.0000 mg | ORAL_TABLET | Freq: Two times a day (BID) | ORAL | 1 refills | Status: AC
  Filled 2023-11-22: qty 120, 30d supply, fill #0

## 2023-11-22 MED ORDER — QUETIAPINE FUMARATE 300 MG PO TABS
300.0000 mg | ORAL_TABLET | Freq: Every day | ORAL | 3 refills | Status: DC
  Filled 2023-11-22: qty 30, 30d supply, fill #0

## 2023-11-22 MED ORDER — LEVOTHYROXINE SODIUM 25 MCG PO TABS
25.0000 ug | ORAL_TABLET | Freq: Every morning | ORAL | 0 refills | Status: AC
  Filled 2023-11-22 – 2023-12-29 (×2): qty 90, 90d supply, fill #0

## 2023-11-22 MED ORDER — BUPROPION HCL ER (XL) 150 MG PO TB24
150.0000 mg | ORAL_TABLET | Freq: Every morning | ORAL | 1 refills | Status: AC
  Filled 2024-02-18: qty 30, 30d supply, fill #0

## 2023-11-22 MED ORDER — BUPROPION HCL ER (XL) 150 MG PO TB24
150.0000 mg | ORAL_TABLET | Freq: Every morning | ORAL | 1 refills | Status: AC
  Filled 2023-12-29: qty 30, 30d supply, fill #0

## 2023-11-22 MED ORDER — CARBAMAZEPINE ER 200 MG PO CP12
200.0000 mg | ORAL_CAPSULE | Freq: Every day | ORAL | 1 refills | Status: AC
  Filled 2023-11-22: qty 30, 30d supply, fill #0

## 2023-11-22 MED ORDER — GABAPENTIN 100 MG PO CAPS: 100.0000 mg | ORAL_CAPSULE | Freq: Two times a day (BID) | ORAL | 1 refills | Status: AC

## 2023-11-22 MED ORDER — BUSPIRONE HCL 10 MG PO TABS
20.0000 mg | ORAL_TABLET | Freq: Two times a day (BID) | ORAL | 1 refills | Status: AC
  Filled 2023-12-29: qty 120, 30d supply, fill #0

## 2023-11-22 MED ORDER — GABAPENTIN 100 MG PO CAPS
100.0000 mg | ORAL_CAPSULE | Freq: Two times a day (BID) | ORAL | 0 refills | Status: AC
  Filled 2023-11-22: qty 60, 30d supply, fill #0

## 2023-11-23 ENCOUNTER — Other Ambulatory Visit (HOSPITAL_COMMUNITY): Payer: Self-pay

## 2023-12-02 ENCOUNTER — Other Ambulatory Visit (HOSPITAL_COMMUNITY): Payer: Self-pay

## 2023-12-08 ENCOUNTER — Other Ambulatory Visit (HOSPITAL_COMMUNITY): Payer: Self-pay

## 2023-12-08 ENCOUNTER — Other Ambulatory Visit: Payer: Self-pay

## 2023-12-08 DIAGNOSIS — F319 Bipolar disorder, unspecified: Secondary | ICD-10-CM | POA: Diagnosis not present

## 2023-12-08 DIAGNOSIS — F411 Generalized anxiety disorder: Secondary | ICD-10-CM | POA: Diagnosis not present

## 2023-12-08 MED ORDER — BUPROPION HCL ER (XL) 150 MG PO TB24
150.0000 mg | ORAL_TABLET | Freq: Every morning | ORAL | 1 refills | Status: AC
  Filled 2023-12-08 – 2023-12-15 (×2): qty 30, 30d supply, fill #0

## 2023-12-08 MED ORDER — CARBAMAZEPINE ER 200 MG PO CP12
200.0000 mg | ORAL_CAPSULE | Freq: Every day | ORAL | 1 refills | Status: AC
  Filled 2023-12-08: qty 30, 30d supply, fill #0
  Filled 2024-01-11: qty 30, 30d supply, fill #1

## 2023-12-08 MED ORDER — BUSPIRONE HCL 10 MG PO TABS
20.0000 mg | ORAL_TABLET | Freq: Two times a day (BID) | ORAL | 1 refills | Status: AC
  Filled 2023-12-08 – 2023-12-29 (×3): qty 120, 30d supply, fill #0

## 2023-12-08 MED ORDER — QUETIAPINE FUMARATE 300 MG PO TABS
300.0000 mg | ORAL_TABLET | Freq: Every day | ORAL | 1 refills | Status: AC
  Filled 2023-12-08 – 2023-12-29 (×3): qty 30, 30d supply, fill #0
  Filled 2024-01-11 – 2024-02-04 (×2): qty 30, 30d supply, fill #1

## 2023-12-15 ENCOUNTER — Other Ambulatory Visit (HOSPITAL_COMMUNITY): Payer: Self-pay

## 2023-12-15 ENCOUNTER — Other Ambulatory Visit: Payer: Self-pay

## 2023-12-25 ENCOUNTER — Other Ambulatory Visit (HOSPITAL_COMMUNITY): Payer: Self-pay

## 2023-12-29 ENCOUNTER — Other Ambulatory Visit: Payer: Self-pay

## 2023-12-29 ENCOUNTER — Other Ambulatory Visit (HOSPITAL_COMMUNITY): Payer: Self-pay

## 2024-01-12 ENCOUNTER — Other Ambulatory Visit: Payer: Self-pay

## 2024-01-12 ENCOUNTER — Other Ambulatory Visit (HOSPITAL_COMMUNITY): Payer: Self-pay

## 2024-01-13 ENCOUNTER — Other Ambulatory Visit (HOSPITAL_COMMUNITY): Payer: Self-pay

## 2024-01-19 ENCOUNTER — Other Ambulatory Visit (HOSPITAL_BASED_OUTPATIENT_CLINIC_OR_DEPARTMENT_OTHER): Payer: Self-pay

## 2024-01-19 ENCOUNTER — Other Ambulatory Visit (HOSPITAL_COMMUNITY): Payer: Self-pay

## 2024-01-19 DIAGNOSIS — F319 Bipolar disorder, unspecified: Secondary | ICD-10-CM | POA: Diagnosis not present

## 2024-01-19 DIAGNOSIS — E785 Hyperlipidemia, unspecified: Secondary | ICD-10-CM

## 2024-01-19 DIAGNOSIS — F411 Generalized anxiety disorder: Secondary | ICD-10-CM | POA: Diagnosis not present

## 2024-01-19 MED ORDER — CARBAMAZEPINE ER 200 MG PO CP12
200.0000 mg | ORAL_CAPSULE | Freq: Every day | ORAL | 0 refills | Status: AC
  Filled 2024-01-19 – 2024-02-04 (×2): qty 30, 30d supply, fill #0

## 2024-01-19 MED ORDER — BUPROPION HCL ER (XL) 150 MG PO TB24
150.0000 mg | ORAL_TABLET | Freq: Every morning | ORAL | 0 refills | Status: AC
  Filled 2024-01-19 – 2024-01-21 (×2): qty 30, 30d supply, fill #0

## 2024-01-19 MED ORDER — QUETIAPINE FUMARATE 300 MG PO TABS
300.0000 mg | ORAL_TABLET | Freq: Every day | ORAL | 0 refills | Status: AC
  Filled 2024-01-19 – 2024-02-06 (×3): qty 30, 30d supply, fill #0

## 2024-01-19 MED ORDER — BUSPIRONE HCL 10 MG PO TABS
10.0000 mg | ORAL_TABLET | Freq: Two times a day (BID) | ORAL | 0 refills | Status: AC
  Filled 2024-01-19 – 2024-01-21 (×2): qty 60, 30d supply, fill #0

## 2024-01-20 ENCOUNTER — Other Ambulatory Visit (HOSPITAL_COMMUNITY): Payer: Self-pay

## 2024-01-20 ENCOUNTER — Ambulatory Visit: Payer: Self-pay | Admitting: Internal Medicine

## 2024-01-20 DIAGNOSIS — H43813 Vitreous degeneration, bilateral: Secondary | ICD-10-CM | POA: Diagnosis not present

## 2024-01-20 DIAGNOSIS — E119 Type 2 diabetes mellitus without complications: Secondary | ICD-10-CM | POA: Diagnosis not present

## 2024-01-20 DIAGNOSIS — H5203 Hypermetropia, bilateral: Secondary | ICD-10-CM | POA: Diagnosis not present

## 2024-01-20 DIAGNOSIS — H2513 Age-related nuclear cataract, bilateral: Secondary | ICD-10-CM | POA: Diagnosis not present

## 2024-01-20 DIAGNOSIS — H524 Presbyopia: Secondary | ICD-10-CM | POA: Diagnosis not present

## 2024-01-20 LAB — HEPATIC FUNCTION PANEL
ALT: 23 IU/L (ref 0–32)
AST: 26 IU/L (ref 0–40)
Albumin: 4.3 g/dL (ref 3.9–4.9)
Alkaline Phosphatase: 100 IU/L (ref 49–135)
Bilirubin Total: 0.3 mg/dL (ref 0.0–1.2)
Bilirubin, Direct: 0.1 mg/dL (ref 0.00–0.40)
Total Protein: 6.8 g/dL (ref 6.0–8.5)

## 2024-01-20 LAB — LIPID PANEL
Chol/HDL Ratio: 1.8 ratio (ref 0.0–4.4)
Cholesterol, Total: 159 mg/dL (ref 100–199)
HDL: 86 mg/dL (ref >39)
LDL Chol Calc (NIH): 63 mg/dL (ref 0–99)
Triglycerides: 48 mg/dL (ref 0–149)
VLDL Cholesterol Cal: 10 mg/dL (ref 5–40)

## 2024-01-20 LAB — OPHTHALMOLOGY REPORT-SCANNED

## 2024-01-21 ENCOUNTER — Other Ambulatory Visit (HOSPITAL_COMMUNITY): Payer: Self-pay

## 2024-02-02 ENCOUNTER — Encounter: Payer: Self-pay | Admitting: Internal Medicine

## 2024-02-03 ENCOUNTER — Other Ambulatory Visit (HOSPITAL_COMMUNITY): Payer: Self-pay

## 2024-02-04 ENCOUNTER — Other Ambulatory Visit (HOSPITAL_COMMUNITY): Payer: Self-pay

## 2024-02-06 ENCOUNTER — Other Ambulatory Visit: Payer: Self-pay

## 2024-02-06 ENCOUNTER — Other Ambulatory Visit (HOSPITAL_COMMUNITY): Payer: Self-pay

## 2024-02-07 ENCOUNTER — Other Ambulatory Visit: Payer: Self-pay

## 2024-02-07 DIAGNOSIS — E119 Type 2 diabetes mellitus without complications: Secondary | ICD-10-CM

## 2024-02-07 MED ORDER — METFORMIN HCL ER 500 MG PO TB24: 500.0000 mg | ORAL_TABLET | Freq: Every day | ORAL | 1 refills | Status: AC

## 2024-02-07 MED ORDER — FREESTYLE LIBRE 2 SENSOR MISC: 1.0000 | Freq: Every day | 5 refills | Status: AC

## 2024-02-14 ENCOUNTER — Other Ambulatory Visit: Payer: Self-pay

## 2024-02-14 ENCOUNTER — Other Ambulatory Visit (HOSPITAL_COMMUNITY): Payer: Self-pay

## 2024-02-14 DIAGNOSIS — F411 Generalized anxiety disorder: Secondary | ICD-10-CM | POA: Diagnosis not present

## 2024-02-14 DIAGNOSIS — F319 Bipolar disorder, unspecified: Secondary | ICD-10-CM | POA: Diagnosis not present

## 2024-02-14 MED ORDER — BUSPIRONE HCL 10 MG PO TABS
10.0000 mg | ORAL_TABLET | Freq: Two times a day (BID) | ORAL | 0 refills | Status: AC
  Filled 2024-02-14: qty 60, 30d supply, fill #0

## 2024-02-14 MED ORDER — BUPROPION HCL ER (XL) 150 MG PO TB24
150.0000 mg | ORAL_TABLET | Freq: Every morning | ORAL | 0 refills | Status: AC
  Filled 2024-02-14: qty 30, 30d supply, fill #0

## 2024-02-14 MED ORDER — CARBAMAZEPINE ER 200 MG PO CP12
200.0000 mg | ORAL_CAPSULE | Freq: Every day | ORAL | 0 refills | Status: AC
  Filled 2024-03-10: qty 30, 30d supply, fill #0

## 2024-02-14 MED ORDER — QUETIAPINE FUMARATE 300 MG PO TABS
300.0000 mg | ORAL_TABLET | Freq: Every day | ORAL | 0 refills | Status: AC
  Filled 2024-03-06: qty 30, 30d supply, fill #0

## 2024-02-18 ENCOUNTER — Other Ambulatory Visit (HOSPITAL_COMMUNITY): Payer: Self-pay

## 2024-02-20 ENCOUNTER — Other Ambulatory Visit: Payer: Self-pay

## 2024-03-06 ENCOUNTER — Other Ambulatory Visit (HOSPITAL_COMMUNITY): Payer: Self-pay

## 2024-03-10 ENCOUNTER — Other Ambulatory Visit (HOSPITAL_COMMUNITY): Payer: Self-pay

## 2024-03-11 ENCOUNTER — Other Ambulatory Visit (HOSPITAL_COMMUNITY): Payer: Self-pay

## 2024-03-12 ENCOUNTER — Other Ambulatory Visit (HOSPITAL_COMMUNITY): Payer: Self-pay

## 2024-03-15 ENCOUNTER — Encounter: Payer: Self-pay | Admitting: Internal Medicine

## 2024-03-19 ENCOUNTER — Other Ambulatory Visit: Payer: Self-pay

## 2024-03-19 ENCOUNTER — Other Ambulatory Visit (HOSPITAL_COMMUNITY): Payer: Self-pay

## 2024-03-19 MED ORDER — BUSPIRONE HCL 10 MG PO TABS
10.0000 mg | ORAL_TABLET | Freq: Two times a day (BID) | ORAL | 0 refills | Status: AC
  Filled 2024-03-19 – 2024-03-26 (×2): qty 60, 30d supply, fill #0

## 2024-03-19 MED ORDER — CARBAMAZEPINE ER 200 MG PO CP12
200.0000 mg | ORAL_CAPSULE | Freq: Every day | ORAL | 0 refills | Status: AC
  Filled 2024-03-19: qty 30, 30d supply, fill #0

## 2024-03-19 MED ORDER — BUPROPION HCL ER (XL) 150 MG PO TB24
150.0000 mg | ORAL_TABLET | Freq: Every morning | ORAL | 0 refills | Status: AC
  Filled 2024-03-19: qty 30, 30d supply, fill #0

## 2024-03-19 MED ORDER — QUETIAPINE FUMARATE 300 MG PO TABS
300.0000 mg | ORAL_TABLET | Freq: Every day | ORAL | 0 refills | Status: AC
  Filled 2024-03-19: qty 30, 30d supply, fill #0

## 2024-03-20 ENCOUNTER — Other Ambulatory Visit (HOSPITAL_COMMUNITY): Payer: Self-pay

## 2024-03-20 ENCOUNTER — Ambulatory Visit

## 2024-03-20 VITALS — Ht 63.0 in | Wt 145.0 lb

## 2024-03-20 DIAGNOSIS — Z0000 Encounter for general adult medical examination without abnormal findings: Secondary | ICD-10-CM

## 2024-03-20 NOTE — Patient Instructions (Addendum)
 Jessica Guzman,  Thank you for taking the time for your Medicare Wellness Visit. I appreciate your continued commitment to your health goals. Please review the care plan we discussed, and feel free to reach out if I can assist you further.  Please note that Annual Wellness Visits do not include a physical exam. Some assessments may be limited, especially if the visit was conducted virtually. If needed, we may recommend an in-person follow-up with your provider.  Ongoing Care Seeing your primary care provider every 3 to 6 months helps us  monitor your health and provide consistent, personalized care. Last office visit on 11/16/2023.  Remember to call the office to get scheduled for you next office visit.  Referrals If a referral was made during today's visit and you haven't received any updates within two weeks, please contact the referred provider directly to check on the status.  Recommended Screenings:  Health Maintenance  Topic Date Due   Medicare Annual Wellness Visit  04/10/2024   Hemoglobin A1C  05/16/2024   Yearly kidney health urinalysis for diabetes  06/08/2024   Complete foot exam   11/15/2024   Yearly kidney function blood test for diabetes  11/16/2024   Eye exam for diabetics  01/19/2025   Breast Cancer Screening  04/27/2025   Pneumococcal Vaccine for age over 7 (3 of 3 - PCV20 or PCV21) 06/21/2027   Pap with HPV screening  05/18/2028   DTaP/Tdap/Td vaccine (3 - Td or Tdap) 09/10/2030   Colon Cancer Screening  10/22/2030   Flu Shot  Completed   Hepatitis C Screening  Completed   HIV Screening  Completed   Zoster (Shingles) Vaccine  Completed   Hepatitis B Vaccine  Aged Out   HPV Vaccine  Aged Out   Meningitis B Vaccine  Aged Out   COVID-19 Vaccine  Discontinued       03/20/2024    3:45 PM  Advanced Directives  Does Patient Have a Medical Advance Directive? No    Vision: Annual vision screenings are recommended for early detection of glaucoma, cataracts, and diabetic  retinopathy. These exams can also reveal signs of chronic conditions such as diabetes and high blood pressure.  Dental: Annual dental screenings help detect early signs of oral cancer, gum disease, and other conditions linked to overall health, including heart disease and diabetes.  Please see the attached documents for additional preventive care recommendations.

## 2024-03-20 NOTE — Progress Notes (Signed)
 "  Chief Complaint  Patient presents with   Medicare Wellness     Subjective:   Jessica Guzman is a 64 y.o. female who presents for a Medicare Annual Wellness Visit.  Visit info / Clinical Intake: Medicare Wellness Visit Type:: Subsequent Annual Wellness Visit Persons participating in visit and providing information:: patient Medicare Wellness Visit Mode:: Telephone If telephone:: video declined Since this visit was completed virtually, some vitals may be partially provided or unavailable. Missing vitals are due to the limitations of the virtual format.: Unable to obtain vitals - no equipment If Telephone or Video please confirm:: I connected with patient using audio/video enable telemedicine. I verified patient identity with two identifiers, discussed telehealth limitations, and patient agreed to proceed. Patient Location:: home Provider Location:: home Interpreter Needed?: No Pre-visit prep was completed: yes AWV questionnaire completed by patient prior to visit?: no Living arrangements:: (!) lives alone Patient's Overall Health Status Rating: good Typical amount of pain: none Does pain affect daily life?: no Are you currently prescribed opioids?: no  Dietary Habits and Nutritional Risks How many meals a day?: 3 Eats fruit and vegetables daily?: yes Most meals are obtained by: preparing own meals In the last 2 weeks, have you had any of the following?: none Diabetic:: (!) yes Any non-healing wounds?: no How often do you check your BS?: continuous glucose monitor Would you like to be referred to a Nutritionist or for Diabetic Management? : no  Functional Status Activities of Daily Living (to include ambulation/medication): Independent Ambulation: Independent with device- listed below Home Assistive Devices/Equipment: Eyeglasses Medication Administration: Independent Home Management (perform basic housework or laundry): Independent Manage your own finances?:  yes Primary transportation is: driving Concerns about vision?: no *vision screening is required for WTM* Concerns about hearing?: no  Fall Screening Falls in the past year?: 0 Number of falls in past year: 0 Was there an injury with Fall?: 0 Fall Risk Category Calculator: 0 Patient Fall Risk Level: Low Fall Risk  Fall Risk Patient at Risk for Falls Due to: No Fall Risks Fall risk Follow up: Falls evaluation completed; Falls prevention discussed  Home and Transportation Safety: All rugs have non-skid backing?: N/A, no rugs All stairs or steps have railings?: N/A, no stairs Grab bars in the bathtub or shower?: (!) no Have non-skid surface in bathtub or shower?: yes Good home lighting?: yes Regular seat belt use?: yes Hospital stays in the last year:: no  Cognitive Assessment Difficulty concentrating, remembering, or making decisions? : no Will 6CIT or Mini Cog be Completed: no 6CIT or Mini Cog Declined: patient alert, oriented, able to answer questions appropriately and recall recent events What year is it?: 0 points What month is it?: 0 points Give patient an address phrase to remember (5 components): [address], [place] what time is it?: 0 points Count backwards from 20 to 1: 0 points Say the months of the year in reverse: 0 points Repeat the address phrase from earlier: 0 points 6 CIT Score: 0 points  Advance Directives (For Healthcare) Does Patient Have a Medical Advance Directive?: No Would patient like information on creating a medical advance directive?: No - Patient declined  Reviewed/Updated  Reviewed/Updated: Reviewed All (Medical, Surgical, Family, Medications, Allergies, Care Teams, Patient Goals)    Allergies (verified) Codeine   Current Medications (verified) Outpatient Encounter Medications as of 03/20/2024  Medication Sig   aspirin 81 MG chewable tablet Chew by mouth daily.   atorvastatin  (LIPITOR) 40 MG tablet Take 1  tablet (40 mg total) by  mouth daily.   b complex vitamins capsule Take 1 capsule by mouth daily.   buPROPion  (WELLBUTRIN  XL) 150 MG 24 hr tablet Take 1 tablet by mouth every morning   busPIRone  (BUSPAR ) 10 MG tablet Take 1 tablet by mouth twice daily   carbamazepine  (CARBATROL ) 200 MG 12 hr capsule Take 1 capsule (200 mg total) by mouth at bedtime.   Continuous Glucose Receiver (FREESTYLE LIBRE 2 READER) DEVI USE AS DIRECTED.   Continuous Glucose Sensor (FREESTYLE LIBRE 2 SENSOR) MISC Place 1 Act onto the skin daily.   Glucagon  (GVOKE HYPOPEN  2-PACK) 1 MG/0.2ML SOAJ Inject 1 Act into the skin daily as needed.   levothyroxine  (SYNTHROID ) 25 MCG tablet Take 1 tablet (25 mcg total) by mouth daily before breakfast.   metFORMIN  (GLUCOPHAGE -XR) 500 MG 24 hr tablet Take 1 tablet (500 mg total) by mouth daily with breakfast.   nitroGLYCERIN  (NITROSTAT ) 0.4 MG SL tablet Place 1 tablet (0.4 mg total) under the tongue every 5 (five) minutes as needed for chest pain.   olmesartan  (BENICAR ) 5 MG tablet Take 1 tablet (5 mg total) by mouth daily.   QUEtiapine  (SEROQUEL ) 300 MG tablet Take 300 mg by mouth daily.   QUEtiapine  (SEROQUEL ) 300 MG tablet Take 1 tablet by mouth daily at bedtime   valACYclovir  (VALTREX ) 500 MG tablet TAKE 1 TABLET(500 MG) BY MOUTH DAILY (Patient taking differently: Take 500 mg by mouth as needed.)   VITAMIN D  PO Take 2 tablets by mouth daily.   atorvastatin  (LIPITOR) 80 MG tablet Take 1 tablet (80 mg total) by mouth at bedtime.   buPROPion  (WELLBUTRIN  XL) 150 MG 24 hr tablet Take 150 mg by mouth daily.   buPROPion  (WELLBUTRIN  XL) 150 MG 24 hr tablet Take 1 tablet (150 mg total) by mouth every morning.   buPROPion  (WELLBUTRIN  XL) 150 MG 24 hr tablet Take 1 tablet (150 mg total) by mouth every morning.   buPROPion  (WELLBUTRIN  XL) 150 MG 24 hr tablet Take 1 tablet (150 mg total) by mouth every morning.   buPROPion  (WELLBUTRIN  XL) 150 MG 24 hr tablet Take 1 tablet by mouth every morning   buPROPion  (WELLBUTRIN   XL) 150 MG 24 hr tablet Take 1 tablet by mouth every morning   buPROPion  (WELLBUTRIN  XL) 150 MG 24 hr tablet Take 1 tablet by mouth every morning   busPIRone  (BUSPAR ) 10 MG tablet Take 20 mg by mouth 2 (two) times daily.   busPIRone  (BUSPAR ) 10 MG tablet Take 2 tablets (20 mg total) by mouth 2 (two) times daily.   busPIRone  (BUSPAR ) 10 MG tablet Take 2 tablets (20 mg total) by mouth 2 (two) times daily.   busPIRone  (BUSPAR ) 10 MG tablet Take 2 tablets (20 mg total) by mouth 2 (two) times daily.   busPIRone  (BUSPAR ) 10 MG tablet Take 2 tablets (20 mg total) by mouth 2 (two) times daily.   busPIRone  (BUSPAR ) 10 MG tablet Take 2 tablets by mouth twice daily   busPIRone  (BUSPAR ) 10 MG tablet Take 1 tablet by mouth twice daily   carbamazepine  (CARBATROL ) 200 MG 12 hr capsule Take 1 capsule (200 mg total) by mouth at bedtime.   carbamazepine  (CARBATROL ) 200 MG 12 hr capsule Take 1 capsule (200 mg total) by mouth at bedtime.   carbamazepine  (CARBATROL ) 200 MG 12 hr capsule Take 1 capsule (200 mg total) by mouth at bedtime.   carbamazepine  (CARBATROL ) 300 MG 12 hr capsule Take 200 mg by mouth at bedtime.  gabapentin  (NEURONTIN ) 100 MG capsule Take 100-200 mg by mouth daily. (Patient not taking: Reported on 03/20/2024)   gabapentin  (NEURONTIN ) 100 MG capsule Take 1 capsule (100 mg total) by mouth 2 (two) times daily.   gabapentin  (NEURONTIN ) 100 MG capsule Take 1 capsule (100 mg total) by mouth 2 (two) times daily.   levothyroxine  (SYNTHROID ) 25 MCG tablet Take 1 tablet (25 mcg total) by mouth every morning.   QUEtiapine  (SEROQUEL ) 300 MG tablet Take 1 tablet by mouth daily at bedtime   QUEtiapine  (SEROQUEL ) 300 MG tablet Take 1 tablet by mouth daily at bedtime   QUEtiapine  (SEROQUEL ) 300 MG tablet Take 1 tablet by mouth daily at bedtime   No facility-administered encounter medications on file as of 03/20/2024.    History: Past Medical History:  Diagnosis Date   Bipolar 1 disorder (HCC)    Diabetes  mellitus without complication (HCC)    Hyperlipidemia    Hypertension    PTSD (post-traumatic stress disorder)    Past Surgical History:  Procedure Laterality Date   PCOS     TUBAL LIGATION     Family History  Problem Relation Age of Onset   Alzheimer's disease Mother    Diabetes Sister    High Cholesterol Sister    High blood pressure Sister    Bipolar disorder Brother    Stroke Maternal Grandfather    Diabetes Maternal Grandfather    Pancreatic cancer Paternal Grandfather    Social History   Occupational History   Occupation: SSDI/ RETIRED  Tobacco Use   Smoking status: Former    Current packs/day: 0.00    Types: Cigarettes    Quit date: 02/01/2019    Years since quitting: 5.1   Smokeless tobacco: Never  Vaping Use   Vaping status: Never Used  Substance and Sexual Activity   Alcohol  use: Not Currently    Comment: rarely   Drug use: Never   Sexual activity: Yes   Tobacco Counseling Counseling given: Not Answered  SDOH Screenings   Food Insecurity: No Food Insecurity (03/20/2024)  Housing: Unknown (03/20/2024)  Transportation Needs: No Transportation Needs (03/20/2024)  Utilities: Not At Risk (03/20/2024)  Alcohol  Screen: Low Risk (11/15/2023)  Depression (PHQ2-9): Low Risk (03/20/2024)  Financial Resource Strain: Low Risk (11/15/2023)  Physical Activity: Sufficiently Active (03/20/2024)  Social Connections: Moderately Integrated (03/20/2024)  Stress: No Stress Concern Present (03/20/2024)  Tobacco Use: Medium Risk (03/20/2024)  Health Literacy: Adequate Health Literacy (03/20/2024)   See flowsheets for full screening details  Depression Screen PHQ 2 & 9 Depression Scale- Over the past 2 weeks, how often have you been bothered by any of the following problems? Little interest or pleasure in doing things: 0 Feeling down, depressed, or hopeless (PHQ Adolescent also includes...irritable): 0 PHQ-2 Total Score: 0 Trouble falling or staying asleep, or sleeping too much:  0 Feeling tired or having little energy: 0 Poor appetite or overeating (PHQ Adolescent also includes...weight loss): 0 Feeling bad about yourself - or that you are a failure or have let yourself or your family down: 0 Trouble concentrating on things, such as reading the newspaper or watching television (PHQ Adolescent also includes...like school work): 0 Moving or speaking so slowly that other people could have noticed. Or the opposite - being so fidgety or restless that you have been moving around a lot more than usual: 0 Thoughts that you would be better off dead, or of hurting yourself in some way: 0 PHQ-9 Total Score: 0 If you checked off any  problems, how difficult have these problems made it for you to do your work, take care of things at home, or get along with other people?: Not difficult at all  Depression Treatment Depression Interventions/Treatment : [id number] Score <4 Follow-up Not Indicated     Goals Addressed             This Visit's Progress    Patient Stated       Not at this time/2026             Objective:    Today's Vitals   03/20/24 1530  Weight: 145 lb (65.8 kg)  Height: 5' 3 (1.6 m)   Body mass index is 25.69 kg/m.  Hearing/Vision screen Hearing Screening - Comments:: Denies hearing difficulties   Vision Screening - Comments:: Wears eyeglasses/UTD/[facility]  Immunizations and Health Maintenance Health Maintenance  Topic Date Due   HEMOGLOBIN A1C  05/16/2024   Diabetic kidney evaluation - Urine ACR  06/08/2024   FOOT EXAM  11/15/2024   Diabetic kidney evaluation - eGFR measurement  11/16/2024   OPHTHALMOLOGY EXAM  01/19/2025   Medicare Annual Wellness (AWV)  03/20/2025   Mammogram  04/27/2025   Pneumococcal Vaccine: 50+ Years (3 of 3 - PCV20 or PCV21) 06/21/2027   Cervical Cancer Screening (HPV/Pap Cotest)  05/18/2028   DTaP/Tdap/Td (3 - Td or Tdap) 09/10/2030   Colonoscopy  10/22/2030   Influenza Vaccine  Completed    Hepatitis C Screening  Completed   HIV Screening  Completed   Zoster Vaccines- Shingrix  Completed   Hepatitis B Vaccines 19-59 Average Risk  Aged Out   HPV VACCINES  Aged Out   Meningococcal B Vaccine  Aged Out   COVID-19 Vaccine  Discontinued        Assessment/Plan:  This is a routine wellness examination for Jessica Guzman.  Patient Care Team: Joshua Debby CROME, MD as PCP - General (Internal Medicine) Thukkani, Arun K, MD as PCP - Cardiology (Cardiology)  I have personally reviewed and noted the following in the patients chart:   Medical and social history Use of alcohol , tobacco or illicit drugs  Current medications and supplements including opioid prescriptions. Functional ability and status Nutritional status Physical activity Advanced directives List of other physicians Hospitalizations, surgeries, and ER visits in previous 12 months Vitals Screenings to include cognitive, depression, and falls Referrals and appointments  No orders of the defined types were placed in this encounter.  In addition, I have reviewed and discussed with patient certain preventive protocols, quality metrics, and best practice recommendations. A written personalized care plan for preventive services as well as general preventive health recommendations were provided to patient.   Jessica Guzman, CMA   03/20/2024   Return in 1 year (on 03/20/2025).  After Visit Summary: (MyChart) Due to this being a telephonic visit, the after visit summary with patients personalized plan was offered to patient via MyChart   Nurse Notes: No voiced or noted concerns at this time.   "

## 2024-03-26 ENCOUNTER — Other Ambulatory Visit: Payer: Self-pay

## 2024-03-26 ENCOUNTER — Other Ambulatory Visit (HOSPITAL_COMMUNITY): Payer: Self-pay

## 2024-03-27 ENCOUNTER — Other Ambulatory Visit: Payer: Self-pay

## 2024-04-02 ENCOUNTER — Other Ambulatory Visit (HOSPITAL_COMMUNITY): Payer: Self-pay

## 2024-04-02 MED ORDER — BUSPIRONE HCL 10 MG PO TABS
10.0000 mg | ORAL_TABLET | Freq: Two times a day (BID) | ORAL | 0 refills | Status: AC
  Filled 2024-04-02: qty 60, 30d supply, fill #0

## 2024-04-02 MED ORDER — CARBAMAZEPINE ER 200 MG PO CP12
200.0000 mg | ORAL_CAPSULE | Freq: Every day | ORAL | 0 refills | Status: AC
  Filled 2024-04-10: qty 30, 30d supply, fill #0

## 2024-04-02 MED ORDER — QUETIAPINE FUMARATE 300 MG PO TABS
300.0000 mg | ORAL_TABLET | Freq: Every day | ORAL | 0 refills | Status: AC
  Filled 2024-04-02: qty 30, 30d supply, fill #0

## 2024-04-02 MED ORDER — BUPROPION HCL ER (XL) 300 MG PO TB24
300.0000 mg | ORAL_TABLET | Freq: Every morning | ORAL | 0 refills | Status: AC
  Filled 2024-04-02: qty 30, 30d supply, fill #0

## 2024-04-10 ENCOUNTER — Other Ambulatory Visit (HOSPITAL_COMMUNITY): Payer: Self-pay

## 2024-04-10 ENCOUNTER — Other Ambulatory Visit: Payer: Self-pay

## 2024-05-03 ENCOUNTER — Ambulatory Visit: Admitting: Internal Medicine

## 2024-05-17 ENCOUNTER — Ambulatory Visit: Admitting: Internal Medicine
